# Patient Record
Sex: Male | Born: 1955 | Race: Black or African American | Hispanic: No | Smoking: Current every day smoker
Health system: Southern US, Community
[De-identification: ages and names within clinical notes are randomized; demographics above are authoritative.]

## PROBLEM LIST (undated history)

## (undated) DIAGNOSIS — F32A Depression, unspecified: Secondary | ICD-10-CM

## (undated) DIAGNOSIS — R112 Nausea with vomiting, unspecified: Secondary | ICD-10-CM

## (undated) DIAGNOSIS — J189 Pneumonia, unspecified organism: Secondary | ICD-10-CM

## (undated) DIAGNOSIS — F329 Major depressive disorder, single episode, unspecified: Secondary | ICD-10-CM

## (undated) DIAGNOSIS — Z9889 Other specified postprocedural states: Secondary | ICD-10-CM

## (undated) DIAGNOSIS — E119 Type 2 diabetes mellitus without complications: Secondary | ICD-10-CM

## (undated) DIAGNOSIS — K5792 Diverticulitis of intestine, part unspecified, without perforation or abscess without bleeding: Secondary | ICD-10-CM

## (undated) DIAGNOSIS — H539 Unspecified visual disturbance: Secondary | ICD-10-CM

## (undated) DIAGNOSIS — M719 Bursopathy, unspecified: Secondary | ICD-10-CM

## (undated) DIAGNOSIS — F4310 Post-traumatic stress disorder, unspecified: Secondary | ICD-10-CM

## (undated) DIAGNOSIS — R972 Elevated prostate specific antigen [PSA]: Secondary | ICD-10-CM

## (undated) DIAGNOSIS — J449 Chronic obstructive pulmonary disease, unspecified: Secondary | ICD-10-CM

## (undated) DIAGNOSIS — E785 Hyperlipidemia, unspecified: Secondary | ICD-10-CM

## (undated) DIAGNOSIS — I10 Essential (primary) hypertension: Secondary | ICD-10-CM

## (undated) DIAGNOSIS — G4730 Sleep apnea, unspecified: Secondary | ICD-10-CM

## (undated) HISTORY — PX: COLONOSCOPY: SHX174

## (undated) HISTORY — DX: Post-traumatic stress disorder, unspecified: F43.10

## (undated) HISTORY — DX: Hyperlipidemia, unspecified: E78.5

## (undated) HISTORY — DX: Major depressive disorder, single episode, unspecified: F32.9

## (undated) HISTORY — PX: POLYPECTOMY: SHX149

## (undated) HISTORY — DX: Depression, unspecified: F32.A

## (undated) HISTORY — DX: Sleep apnea, unspecified: G47.30

## (undated) HISTORY — DX: Chronic obstructive pulmonary disease, unspecified: J44.9

## (undated) HISTORY — PX: PROSTATE BIOPSY: SHX241

## (undated) HISTORY — DX: Type 2 diabetes mellitus without complications: E11.9

## (undated) HISTORY — DX: Unspecified visual disturbance: H53.9

---

## 2015-03-17 ENCOUNTER — Encounter (HOSPITAL_COMMUNITY): Payer: Self-pay | Admitting: Emergency Medicine

## 2015-03-17 ENCOUNTER — Emergency Department (HOSPITAL_COMMUNITY)
Admission: EM | Admit: 2015-03-17 | Discharge: 2015-03-17 | Disposition: A | Discharge status: Home / Self Care | Payer: Self-pay | Attending: Emergency Medicine | Admitting: Emergency Medicine

## 2015-03-17 ENCOUNTER — Emergency Department (HOSPITAL_COMMUNITY): Payer: Self-pay

## 2015-03-17 DIAGNOSIS — Y9389 Activity, other specified: Secondary | ICD-10-CM | POA: Insufficient documentation

## 2015-03-17 DIAGNOSIS — Z720 Tobacco use: Secondary | ICD-10-CM | POA: Insufficient documentation

## 2015-03-17 DIAGNOSIS — W010XXA Fall on same level from slipping, tripping and stumbling without subsequent striking against object, initial encounter: Secondary | ICD-10-CM | POA: Insufficient documentation

## 2015-03-17 DIAGNOSIS — Y9289 Other specified places as the place of occurrence of the external cause: Secondary | ICD-10-CM | POA: Insufficient documentation

## 2015-03-17 DIAGNOSIS — S20212A Contusion of left front wall of thorax, initial encounter: Secondary | ICD-10-CM | POA: Insufficient documentation

## 2015-03-17 DIAGNOSIS — Y998 Other external cause status: Secondary | ICD-10-CM | POA: Insufficient documentation

## 2015-03-17 DIAGNOSIS — I159 Secondary hypertension, unspecified: Secondary | ICD-10-CM | POA: Insufficient documentation

## 2015-03-17 HISTORY — DX: Essential (primary) hypertension: I10

## 2015-03-17 MED ORDER — TRAMADOL HCL 50 MG PO TABS: 50.0000 mg | ORAL_TABLET | Freq: Four times a day (QID) | ORAL | Status: DC | PRN

## 2015-03-17 MED ORDER — CYCLOBENZAPRINE HCL 10 MG PO TABS: 10.0000 mg | ORAL_TABLET | Freq: Two times a day (BID) | ORAL | Status: DC | PRN

## 2015-03-17 NOTE — ED Provider Notes (Signed)
CSN: [id number]     Arrival date & time 03/17/15  1611 History  This chart was scribed for non-physician provider Marlon Peliffany Kestrel Mis, PA-C, working with Mancel BaleElliott Wentz, MD by Phillis HaggisGabriella Gaje, ED Scribe. This patient was seen in room TR03C/TR03C and patient care was started at 4:56 PM.    Chief Complaint  Patient presents with  . Rib Injury   The history is provided by the patient. No language interpreter was used.  HPI Comments: Thomas Medina is a 59 y.o. Male with HTN who presents to the Emergency Department complaining of sharp left rib pain onset 3 days ago. He states that he was playing with his grandchildren when he tripped and fell onto his left flank onto cement. Reports that the pain is 5/10, worsens with breathing, coughing or certain movements. He reports taking Aleve to no relief; reports that he has trouble sleeping due to pain. He denies SOB, hematuria, dysuria, chest pain, abdominal pain, leg swelling, or LOC. Pt reports that he has an appointment with his PCP for his HTN later this month, they noted HBP at his last visit and are going to recheck it then will most likely be starting him on medications. Denies chest pains, DOE, angina, weakness, fatigue. Pt is not currently taking any medications, He denies any known allergies.   Past Medical History  Diagnosis Date  . Hypertension    History reviewed. No pertinent past surgical history. No family history on file. History  Substance Use Topics  . Smoking status: Current Every Day Smoker -- 0.50 packs/day    Types: Cigarettes  . Smokeless tobacco: Not on file  . Alcohol Use: Yes    Review of Systems A complete 10 system review of systems was obtained and all systems are negative except as noted in the HPI and PMH.   Allergies  Review of patient's allergies indicates no known allergies.  Home Medications   Prior to Admission medications   Medication Sig Start Date End Date Taking? Authorizing Provider  cyclobenzaprine  (FLEXERIL) 10 MG tablet Take 1 tablet (10 mg total) by mouth 2 (two) times daily as needed for muscle spasms. 03/17/15   Marlon Peliffany Mirtie Bastyr, PA-C  traMADol (ULTRAM) 50 MG tablet Take 1 tablet (50 mg total) by mouth every 6 (six) hours as needed. 03/17/15   Prestin Munch Neva SeatGreene, PA-C   BP 186/86 mmHg  Pulse 100  Temp(Src) 98.5 F (36.9 C) (Oral)  Resp 18  Ht 5\' 9"  (1.753 m)  Wt 220 lb 4.8 oz (99.927 kg)  BMI 32.52 kg/m2  SpO2 96%  Physical Exam  Constitutional: He is oriented to person, place, and time. He appears well-developed and well-nourished.  HENT:  Head: Normocephalic and atraumatic.  Eyes: EOM are normal.  Neck: Normal range of motion. Neck supple.  Cardiovascular: Normal rate and regular rhythm.  Exam reveals no gallop and no friction rub.   No murmur heard. Pulmonary/Chest: Effort normal and breath sounds normal. He has no decreased breath sounds. He has no rales. He exhibits no tenderness, no crepitus and no deformity.  Abdominal: Soft. Bowel sounds are normal. There is no tenderness.  Musculoskeletal: Normal range of motion.  Tenderness over ribs 7-8 to the left lateral ribs. No sternal chest pains or midline thoracic or lumbar tenderness. No crepitus, flail chest, ecchymosis, hematoma overlying the tender region.  Neurological: He is alert and oriented to person, place, and time.  Skin: Skin is warm and dry.  Psychiatric: He has a normal mood and affect. His behavior  is normal.  Nursing note and vitals reviewed.   ED Course  Procedures (including critical care time) DIAGNOSTIC STUDIES: Oxygen Saturation is 96% on RA, normal by my interpretation.    COORDINATION OF CARE: 5:03 PM-Discussed treatment plan which includes rib w/ chest x-ray with pt at bedside and pt agreed to plan.   5:30 PM- will prescribe Ultram and Flexeril for pain; told pt to do deep breathing  Labs Review Labs Reviewed - No data to display  Imaging Review Dg Ribs Unilateral W/chest Left  03/17/2015    CLINICAL DATA:  Fall onto left chest 4 days ago. Left rib and chest pain. Dyspnea.  EXAM: LEFT RIBS AND CHEST - 3+ VIEW  COMPARISON:  None.  FINDINGS: No fracture or other bone lesions are seen involving the ribs. There is no evidence of pneumothorax or pleural effusion. Both lungs are clear. Heart size and mediastinal contours are within normal limits.  IMPRESSION: Negative.   Electronically Signed   By: Myles Rosenthal M.D.   On: 03/17/2015 17:16     EKG Interpretation None      MDM   Final diagnoses:  Secondary hypertension, unspecified  Rib contusion, left, initial encounter    The patients xrays show no obvious fractures. THe patient has been made aware that small fractures not always show up on xrays and that clinically he may have a tiny fracture. A CT scan would be a more definitive test but would not alter the treatment. He was comfortable with clinically being treated as a fracture. He shows no sternal pain, normal lung sounds, physiologic heart sounds. He has some discomfort with large breaths but has been breathing normally. Discussed how he needs to remember to frequently take large cleansing breaths to decrease risk of pneumonia.  Rx: flexeril and Ultram.  Patient has f/u appointment coming up soon for his BP.  Medications - No data to display  59 y.o.Thomas Medina's evaluation in the Emergency Department is complete. It has been determined that no acute conditions requiring further emergency intervention are present at this time. The patient/guardian have been advised of the diagnosis and plan. We have discussed signs and symptoms that warrant return to the ED, such as changes or worsening in symptoms.  Vital signs are stable at discharge. Filed Vitals:   03/17/15 1737  BP: 173/92  Pulse: 95  Temp: 98.3 F (36.8 C)  Resp: 18    Patient/guardian has voiced understanding and agreed to follow-up with the PCP or specialist.  I personally performed the services described  in this documentation, which was scribed in my presence. The recorded information has been reviewed and is accurate.   Marlon Pel, PA-C 03/17/15 2244  Mancel Bale, MD 03/17/15 [phone]

## 2015-03-17 NOTE — Discharge Instructions (Signed)
Chest Contusion A chest contusion is a deep bruise on your chest area. Contusions are the result of an injury that caused bleeding under the skin. A chest contusion may involve bruising of the skin, muscles, or ribs. The contusion may turn blue, purple, or yellow. Minor injuries will give you a painless contusion, but more severe contusions may stay painful and swollen for a few weeks. CAUSES  A contusion is usually caused by a blow, trauma, or direct force to an area of the body. SYMPTOMS   Swelling and redness of the injured area.  Discoloration of the injured area.  Tenderness and soreness of the injured area.  Pain. DIAGNOSIS  The diagnosis can be made by taking a history and performing a physical exam. An X-ray, CT scan, or MRI may be needed to determine if there were any associated injuries, such as broken bones (fractures) or internal injuries. TREATMENT  Often, the best treatment for a chest contusion is resting, icing, and applying cold compresses to the injured area. Deep breathing exercises may be recommended to reduce the risk of pneumonia. Over-the-counter medicines may also be recommended for pain control. HOME CARE INSTRUCTIONS   Put ice on the injured area.  Put ice in a plastic bag.  Place a towel between your skin and the bag.  Leave the ice on for 15-20 minutes, 03-04 times a day.  Only take over-the-counter or prescription medicines as directed by your caregiver. Your caregiver may recommend avoiding anti-inflammatory medicines (aspirin, ibuprofen, and naproxen) for 48 hours because these medicines may increase bruising.  Rest the injured area.  Perform deep-breathing exercises as directed by your caregiver.  Stop smoking if you smoke.  Do not lift objects over 5 pounds (2.3 kg) for 3 days or longer if recommended by your caregiver. SEEK IMMEDIATE MEDICAL CARE IF:   You have increased bruising or swelling.  You have pain that is getting worse.  You have  difficulty breathing.  You have dizziness, weakness, or fainting.  You have blood in your urine or stool.  You cough up or vomit blood.  Your swelling or pain is not relieved with medicines. MAKE SURE YOU:   Understand these instructions.  Will watch your condition.  Will get help right away if you are not doing well or get worse. Document Released: 05/24/2001 Document Revised: 05/23/2012 Document Reviewed: 02/20/2012 [facility] Patient Information 2015 [place], [place]. This information is not intended to replace advice given to you by your health care provider. Make sure you discuss any questions you have with your health care provider.  Hypertension Hypertension, commonly called high blood pressure, is when the force of blood pumping through your arteries is too strong. Your arteries are the blood vessels that carry blood from your heart throughout your body. A blood pressure reading consists of a higher number over a lower number, such as 110/72. The higher number (systolic) is the pressure inside your arteries when your heart pumps. The lower number (diastolic) is the pressure inside your arteries when your heart relaxes. Ideally you want your blood pressure below 120/80. Hypertension forces your heart to work harder to pump blood. Your arteries may become narrow or stiff. Having hypertension puts you at risk for heart disease, stroke, and other problems.  RISK FACTORS Some risk factors for high blood pressure are controllable. Others are not.  Risk factors you cannot control include:   Race. You may be at higher risk if you are African American.  Age. Risk increases with age.  Gender. Men are at higher risk than women before age 58 years. After age 33, women are at higher risk than men. Risk factors you can control include:  Not getting enough exercise or physical activity.  Being overweight.  Getting too much fat, sugar, calories, or salt in your diet.  Drinking too much  alcohol. SIGNS AND SYMPTOMS Hypertension does not usually cause signs or symptoms. Extremely high blood pressure (hypertensive crisis) may cause headache, anxiety, shortness of breath, and nosebleed. DIAGNOSIS  To check if you have hypertension, your health care provider will measure your blood pressure while you are seated, with your arm held at the level of your heart. It should be measured at least twice using the same arm. Certain conditions can cause a difference in blood pressure between your right and left arms. A blood pressure reading that is higher than normal on one occasion does not mean that you need treatment. If one blood pressure reading is high, ask your health care provider about having it checked again. TREATMENT  Treating high blood pressure includes making lifestyle changes and possibly taking medicine. Living a healthy lifestyle can help lower high blood pressure. You may need to change some of your habits. Lifestyle changes may include:  Following the DASH diet. This diet is high in fruits, vegetables, and whole grains. It is low in salt, red meat, and added sugars.  Getting at least 2 hours of brisk physical activity every week.  Losing weight if necessary.  Not smoking.  Limiting alcoholic beverages.  Learning ways to reduce stress. If lifestyle changes are not enough to get your blood pressure under control, your health care provider may prescribe medicine. You may need to take more than one. Work closely with your health care provider to understand the risks and benefits. HOME CARE INSTRUCTIONS  Have your blood pressure rechecked as directed by your health care provider.   Take medicines only as directed by your health care provider. Follow the directions carefully. Blood pressure medicines must be taken as prescribed. The medicine does not work as well when you skip doses. Skipping doses also puts you at risk for problems.   Do not smoke.   Monitor your  blood pressure at home as directed by your health care provider. SEEK MEDICAL CARE IF:   You think you are having a reaction to medicines taken.  You have recurrent headaches or feel dizzy.  You have swelling in your ankles.  You have trouble with your vision. SEEK IMMEDIATE MEDICAL CARE IF:  You develop a severe headache or confusion.  You have unusual weakness, numbness, or feel faint.  You have severe chest or abdominal pain.  You vomit repeatedly.  You have trouble breathing. MAKE SURE YOU:   Understand these instructions.  Will watch your condition.  Will get help right away if you are not doing well or get worse. Document Released: 08/29/2005 Document Revised: 01/13/2014 Document Reviewed: 06/21/2013 [facility] Patient Information 2015 [place], [place]. This information is not intended to replace advice given to you by your health care provider. Make sure you discuss any questions you have with your health care provider.  Rib Contusion A rib contusion (bruise) can occur by a blow to the chest or by a fall against a hard object. Usually these will be much better in a couple weeks. If X-rays were taken today and there are no broken bones (fractures), the diagnosis of bruising is made. However, broken ribs may not show up for several days,  or may be discovered later on a routine X-ray when signs of healing show up. If this happens to you, it does not mean that something was missed on the X-ray, but simply that it did not show up on the first X-rays. Earlier diagnosis will not usually change the treatment. HOME CARE INSTRUCTIONS   Avoid strenuous activity. Be careful during activities and avoid bumping the injured ribs. Activities that pull on the injured ribs and cause pain should be avoided, if possible.  For the first day or two, an ice pack used every 20 minutes while awake may be helpful. Put ice in a plastic bag and put a towel between the bag and the skin.  Eat a normal,  well-balanced diet. Drink plenty of fluids to avoid constipation.  Take deep breaths several times a day to keep lungs free of infection. Try to cough several times a day. Splint the injured area with a pillow while coughing to ease pain. Coughing can help prevent pneumonia.  Wear a rib belt or binder only if told to do so by your caregiver. If you are wearing a rib belt or binder, you must do the breathing exercises as directed by your caregiver. If not used properly, rib belts or binders restrict breathing which can lead to pneumonia.  Only take over-the-counter or prescription medicines for pain, discomfort, or fever as directed by your caregiver. SEEK MEDICAL CARE IF:   You or your child has an oral temperature above 102 F (38.9 C).  Your baby is older than 3 months with a rectal temperature of 100.5 F (38.1 C) or higher for more than 1 day.  You develop a cough, with thick or bloody sputum. SEEK IMMEDIATE MEDICAL CARE IF:   You have difficulty breathing.  You feel sick to your stomach (nausea), have vomiting or belly (abdominal) pain.  You have worsening pain, not controlled with medications, or there is a change in the location of the pain.  You develop sweating or radiation of the pain into the arms, jaw or shoulders, or become light headed or faint.  You or your child has an oral temperature above 102 F (38.9 C), not controlled by medicine.  Your or your baby is older than 3 months with a rectal temperature of 102 F (38.9 C) or higher.  Your baby is 653 months old or younger with a rectal temperature of 100.4 F (38 C) or higher. MAKE SURE YOU:   Understand these instructions.  Will watch your condition.  Will get help right away if you are not doing well or get worse. Document Released: 05/24/2001 Document Revised: 12/24/2012 Document Reviewed: 04/16/2008 [facility] Patient Information 2015 [place]. This information is not intended to replace advice given to  you by your health care provider. Make sure you discuss any questions you have with your health care provider.

## 2015-03-17 NOTE — ED Notes (Signed)
Pt sts 3 days ago he fell on his left side while playing with kids. C.o pain to L rib cage. Denies pain anywhere else. resp e/u.

## 2016-10-21 ENCOUNTER — Encounter (HOSPITAL_COMMUNITY): Payer: Self-pay | Admitting: *Deleted

## 2016-10-21 ENCOUNTER — Emergency Department (HOSPITAL_COMMUNITY)
Admission: EM | Admit: 2016-10-21 | Discharge: 2016-10-21 | Disposition: A | Discharge status: Home / Self Care | Payer: Self-pay | Attending: Emergency Medicine | Admitting: Emergency Medicine

## 2016-10-21 DIAGNOSIS — H1033 Unspecified acute conjunctivitis, bilateral: Secondary | ICD-10-CM | POA: Insufficient documentation

## 2016-10-21 DIAGNOSIS — I10 Essential (primary) hypertension: Secondary | ICD-10-CM | POA: Insufficient documentation

## 2016-10-21 DIAGNOSIS — F17210 Nicotine dependence, cigarettes, uncomplicated: Secondary | ICD-10-CM | POA: Insufficient documentation

## 2016-10-21 MED ORDER — FLUORESCEIN SODIUM 0.6 MG OP STRP
1.0000 | ORAL_STRIP | Freq: Once | OPHTHALMIC | Status: AC
  Administered 2016-10-21: 1 via OPHTHALMIC
  Filled 2016-10-21: qty 1

## 2016-10-21 MED ORDER — TETRACAINE HCL 0.5 % OP SOLN
1.0000 [drp] | Freq: Once | OPHTHALMIC | Status: AC
  Administered 2016-10-21: 1 [drp] via OPHTHALMIC
  Filled 2016-10-21: qty 2

## 2016-10-21 MED ORDER — ERYTHROMYCIN 5 MG/GM OP OINT
1.0000 "application " | TOPICAL_OINTMENT | Freq: Once | OPHTHALMIC | Status: AC
  Administered 2016-10-21: 1 via OPHTHALMIC
  Filled 2016-10-21: qty 3.5

## 2016-10-21 NOTE — Discharge Instructions (Signed)
Use the eye ointment 3 times a day for the next 7 days. Follow up with the eye doctor if symptoms are not improving over the next few day.  Take Benadryl as needed for the itching.

## 2016-10-21 NOTE — ED Provider Notes (Signed)
[facility]-EMERGENCY DEPT Provider Note   CSN: [id number] Arrival date & time: 10/21/16  1708  By signing my name below, I, Alyssa GroveMartin Green, attest that this documentation has been prepared under the direction and in the presence of Kerrie BuffaloHope Arkel Cartwright, NP. Electronically Signed: Alyssa GroveMartin Green, ED Scribe. 10/21/16. 9:37 PM.  History   Chief Complaint Chief Complaint  Patient presents with  . Conjunctivitis   The history is provided by the patient. No language interpreter was used.   HPI Comments: Thomas Medina is a 61 y.o. male who presents to the Emergency Department complaining of gradual onset, constant, moderate bilateral eye injection for 4 days. Per pt, he has had contact with family members who have had pink eye within the last week. Pt reports associated eye pain and eye watering and itching. He believes it spread from the left eye to the right eye. He denies PMHx of Glaucoma. Pt does not have an eye doctor, but does wear glasses. He denies cough, congestion.   Past Medical History:  Diagnosis Date  . Hypertension     There are no active problems to display for this patient.   History reviewed. No pertinent surgical history.     Home Medications    Prior to Admission medications   Medication Sig Start Date End Date Taking? Authorizing Provider  cyclobenzaprine (FLEXERIL) 10 MG tablet Take 1 tablet (10 mg total) by mouth 2 (two) times daily as needed for muscle spasms. 03/17/15   Marlon Peliffany Greene, PA-C  traMADol (ULTRAM) 50 MG tablet Take 1 tablet (50 mg total) by mouth every 6 (six) hours as needed. 03/17/15   Marlon Peliffany Greene, PA-C    Family History No family history on file.  Social History Social History  Substance Use Topics  . Smoking status: Current Every Day Smoker    Packs/day: 0.50    Types: Cigarettes  . Smokeless tobacco: Never Used  . Alcohol use Yes     Comment: occ     Allergies   Patient has no known allergies.   Review of Systems Review of Systems    Constitutional: Negative for chills and fever.  HENT: Negative for congestion and sore throat.   Eyes: Positive for pain, discharge, redness, itching and visual disturbance (blurry vision).  Respiratory: Negative for cough.   Musculoskeletal: Negative for joint swelling.  Skin: Negative for rash.  Neurological: Negative for headaches.     Physical Exam Updated Vital Signs BP 185/85 (BP Location: Right Arm)   Pulse 82   Temp 98.5 F (36.9 C) (Oral)   Resp 16   Ht 5\' 9"  (1.753 m)   Wt 111.6 kg   SpO2 96%   BMI 36.33 kg/m   Physical Exam  Constitutional: He is oriented to person, place, and time. He appears well-developed and well-nourished. He is active. No distress.  HENT:  Head: Normocephalic and atraumatic.  uvula midline No edema or erythema   Eyes: EOM are normal. Pupils are equal, round, and reactive to light. Lids are everted and swept, no foreign bodies found. Right eye exhibits discharge. No foreign body present in the right eye. Left eye exhibits discharge. No foreign body present in the left eye. Right conjunctiva is injected. Left conjunctiva is injected.  Fundoscopic exam:      The right eye shows exudate.       The left eye shows exudate.  Slit lamp exam:      The right eye shows no corneal abrasion, no corneal ulcer, no foreign body and  no fluorescein uptake.       The left eye shows no corneal abrasion, no corneal ulcer, no foreign body and no fluorescein uptake.  Drainage in both eyes Sclera injected bilaterally  Neck: Neck supple.  No cervical lymphadenopathy  Cardiovascular: Normal rate and regular rhythm.   Pulmonary/Chest: Effort normal. He has no wheezes. He has no rales.  Musculoskeletal: Normal range of motion.  Lymphadenopathy:    He has no cervical adenopathy.  Neurological: He is alert and oriented to person, place, and time.  Skin: Skin is warm and dry.  Psychiatric: He has a normal mood and affect. His behavior is normal.  Nursing note and  vitals reviewed.    ED Treatments / Results  DIAGNOSTIC STUDIES: Oxygen Saturation is 94% on RA, adequate by my interpretation.    COORDINATION OF CARE: 8:49 PM Discussed treatment plan with pt at bedside which includes visual acuity screening and pt agreed to plan Radiology No results found.  Procedures Procedures (including critical care time)  Medications Ordered in ED Medications  tetracaine (PONTOCAINE) 0.5 % ophthalmic solution 1 drop (1 drop Both Eyes Given 10/21/16 2205)  fluorescein ophthalmic strip 1 strip (1 strip Both Eyes Given 10/21/16 2205)  erythromycin ophthalmic ointment 1 application (1 application Both Eyes Given 10/21/16 2205)     Initial Impression / Assessment and Plan / ED Course  I have reviewed the triage vital signs and the nursing notes. I personally performed the services described in this documentation, which was scribed in my presence. The recorded information has been reviewed and is accurate.   Final Clinical Impressions(s) / ED Diagnoses  61 y.o. male with bilateral eye redness, drainage and itching after being exposed to other family members with "pink eye" stable for d/c without visual change, f/b, fever and does not appear toxic. Erythromycin Opth Ointment to both eyes prior to d/c and patient to use three times a day for the next week. If symptoms persist after the next 3 days he will f/u with opthalmology. Referral given. He will return here as needed.  Final diagnoses:  Acute bacterial conjunctivitis of both eyes    New Prescriptions Discharge Medication List as of 10/21/2016 10:10 PM       Janne Napoleon, NP 10/22/16 1452    Maia Plan, MD 10/23/16 1900

## 2016-10-21 NOTE — ED Triage Notes (Signed)
Conjunctiva in both eyes is red and painful.  States family members recently with pink eye.

## 2017-03-05 LAB — GLUCOSE, POCT (MANUAL RESULT ENTRY): POC Glucose: 125 mg/dl — AB (ref 70–99)

## 2017-03-27 ENCOUNTER — Encounter (INDEPENDENT_AMBULATORY_CARE_PROVIDER_SITE_OTHER): Payer: Self-pay | Admitting: Physician Assistant

## 2017-03-27 ENCOUNTER — Ambulatory Visit (INDEPENDENT_AMBULATORY_CARE_PROVIDER_SITE_OTHER): Payer: Self-pay | Admitting: Physician Assistant

## 2017-03-27 VITALS — BP 143/74 | HR 83 | Temp 98.0°F | Wt 259.2 lb

## 2017-03-27 DIAGNOSIS — L309 Dermatitis, unspecified: Secondary | ICD-10-CM

## 2017-03-27 DIAGNOSIS — Z91199 Patient's noncompliance with other medical treatment and regimen due to unspecified reason: Secondary | ICD-10-CM

## 2017-03-27 DIAGNOSIS — Z9119 Patient's noncompliance with other medical treatment and regimen: Secondary | ICD-10-CM

## 2017-03-27 DIAGNOSIS — Z760 Encounter for issue of repeat prescription: Secondary | ICD-10-CM

## 2017-03-27 DIAGNOSIS — Z131 Encounter for screening for diabetes mellitus: Secondary | ICD-10-CM

## 2017-03-27 DIAGNOSIS — I10 Essential (primary) hypertension: Secondary | ICD-10-CM

## 2017-03-27 LAB — POCT GLYCOSYLATED HEMOGLOBIN (HGB A1C): Hemoglobin A1C: 6.2

## 2017-03-27 MED ORDER — METOPROLOL SUCCINATE ER 50 MG PO TB24: 50.0000 mg | ORAL_TABLET | Freq: Every day | ORAL | 3 refills | Status: DC

## 2017-03-27 MED ORDER — SILDENAFIL CITRATE 100 MG PO TABS: 100.0000 mg | ORAL_TABLET | Freq: Every day | ORAL | 2 refills | Status: DC | PRN

## 2017-03-27 MED ORDER — MELOXICAM 15 MG PO TABS: 15.0000 mg | ORAL_TABLET | Freq: Every day | ORAL | 0 refills | Status: DC

## 2017-03-27 MED ORDER — HYDROCHLOROTHIAZIDE 25 MG PO TABS: 25.0000 mg | ORAL_TABLET | Freq: Every day | ORAL | 3 refills | Status: DC

## 2017-03-27 MED ORDER — AMLODIPINE BESYLATE 10 MG PO TABS: 10.0000 mg | ORAL_TABLET | Freq: Every day | ORAL | 3 refills | Status: DC

## 2017-03-27 MED ORDER — LOSARTAN POTASSIUM 100 MG PO TABS: 100.0000 mg | ORAL_TABLET | Freq: Every day | ORAL | 3 refills | Status: DC

## 2017-03-27 MED ORDER — TRIAMCINOLONE ACETONIDE 0.5 % EX CREA: 1.0000 "application " | TOPICAL_CREAM | Freq: Two times a day (BID) | CUTANEOUS | 0 refills | Status: AC

## 2017-03-27 MED FILL — !VIAGRA 100 MG TABLET: 100 MG | 30 days supply | Qty: 2 | Fill #0

## 2017-03-27 MED FILL — LOSARTAN POTASSIUM 100 MG T: 100 | 30 days supply | Qty: 30 | Fill #0

## 2017-03-27 MED FILL — TRIAMCINOLONE 0.5% CREAM: 0.5 | 7 days supply | Qty: 30 | Fill #0

## 2017-03-27 MED FILL — ?AMLODIPINE BESYLATE 10 MG: 10 | 30 days supply | Qty: 30 | Fill #0

## 2017-03-27 MED FILL — ?METOPROLOL SUCC ER 50 MG: 50 MG | 30 days supply | Qty: 30 | Fill #0

## 2017-03-27 MED FILL — ?MELOXICAM 15 MG TABLET: 15 MG | 30 days supply | Qty: 30 | Fill #0

## 2017-03-27 MED FILL — HYDROCHLOROTHIAZIDE 25 MG T: 25 | 30 days supply | Qty: 30 | Fill #0

## 2017-03-27 NOTE — Progress Notes (Signed)
Subjective:  Patient ID: Thomas Medina, male    DOB: [birth year]  Age: 61 y.o. MRN: [id number]  CC: HTN  HPI Ernst Cumpston is a 61 y.o. male with a PMH of HTN presents as a new pt to address HTN and blood sugar. Pt went to a health fair recently and was found to have elevated BP and a fasting blood sugar of 125. Has not taken BP medications in 3-4 days. Does not know what anti-hypertensive medications he takes. Used to take medications only when he felt dizzy but feels he has been better about taking his antihypertensive recently.  Does not endorse CP, palpitations, SOB, HA, abdominal pain, tingling, numbness, orthopnea, PND, claudication, presyncope, syncope, or GI/GU symptoms.    In regards to fasting blood sugar, patient does not endorse hx of diabetes. Has never had to take anti-glycemics. Does not endorse polydipsia, polyuria, polyphagia, visual blurring, or fatigue.    Has a pruritic patch on the dorsal aspect of his right wrist. Present for approximately four weeks. Applied Preparation H on rash with relief of symptoms.          ROS Review of Systems  Constitutional: Negative for chills, fever and malaise/fatigue.  Eyes: Negative for blurred vision.  Respiratory: Negative for shortness of breath.   Cardiovascular: Negative for chest pain and palpitations.  Gastrointestinal: Negative for abdominal pain and nausea.  Genitourinary: Negative for dysuria and hematuria.  Musculoskeletal: Negative for joint pain and myalgias.  Skin: Positive for rash (right wrist).  Neurological: Negative for tingling and headaches.  Psychiatric/Behavioral: Negative for depression. The patient is not nervous/anxious.     Objective:  BP (!) 143/74 (BP Location: Left Arm, Patient Position: Sitting, Cuff Size: Large)   Pulse 83   Temp 98 F (36.7 C) (Oral)   Wt 259 lb 3.2 oz (117.6 kg)   SpO2 94%   BMI 38.28 kg/m   BP/Weight 03/27/2017 10/21/2016 03/17/2015  Systolic BP 143 185 173  Diastolic BP 74  85 92  Wt. (Lbs) 259.2 246 220.3  BMI 38.28 36.33 32.52      Physical Exam  Constitutional: He is oriented to person, place, and time.  Well developed, obese, NAD, polite  HENT:  Head: Normocephalic and atraumatic.  Eyes: No scleral icterus.  Neck: Normal range of motion. Neck supple. No thyromegaly present.  Cardiovascular: Normal rate, regular rhythm and normal heart sounds.   No carotid bruit bilaterally  Pulmonary/Chest: Effort normal and breath sounds normal.  Abdominal: Soft. Bowel sounds are normal. There is no tenderness.  Large ventral hernia along region of linea alba  Musculoskeletal: He exhibits no edema.  Neurological: He is alert and oriented to person, place, and time. No cranial nerve deficit. Coordination normal.  Skin: Skin is warm and dry. No rash noted. No erythema. No pallor.  Partially crusted patch on dorsal aspect of right wrist with multiple small papules and mild hyperpigmentation. No suppuration, cellulitis, induration, tenderness, ecchymosis, or edema.  Psychiatric: He has a normal mood and affect. His behavior is normal. Thought content normal.  Vitals reviewed.    Assessment & Plan:   1. Screening for diabetes mellitus - HgB A1c 6.2% in clinic today - Pt counseled on benefits of diet and exercise. He was instructed to make and attain easy and short term goals in order to achieve his long term goals. Pt assured me he will go back to the gym and eat healthier to lose weight.  2. Hypertension, unspecified type - Anti-hypertensive list attained during  visit when pt called home and had family member read off the meds he was prescribed.  - Comprehensive metabolic panel; Future - CBC with Differential; Future - TSH; Future - Lipid Panel; Future - metoprolol succinate (TOPROL-XL) 50 MG 24 hr tablet; Take 1 tablet (50 mg total) by mouth daily. Take with or immediately following a meal.  Dispense: 90 tablet; Refill: 3 - hydrochlorothiazide (HYDRODIURIL) 25  MG tablet; Take 1 tablet (25 mg total) by mouth daily. Take on tablet in the morning.  Dispense: 90 tablet; Refill: 3 - amLODipine (NORVASC) 10 MG tablet; Take 1 tablet (10 mg total) by mouth daily.  Dispense: 90 tablet; Refill: 3 - losartan (COZAAR) 100 MG tablet; Take 1 tablet (100 mg total) by mouth daily.  Dispense: 90 tablet; Refill: 3   3. Noncompliance - I have strongly advised that patient take his anti-hypertensives daily as directed. Warned patient about risks and complications on noncompliance in the setting of hypertension.  4. Medication refill - sildenafil (VIAGRA) 100 MG tablet; Take 1 tablet (100 mg total) by mouth daily as needed for erectile dysfunction.  Dispense: 10 tablet; Refill: 2 - meloxicam (MOBIC) 15 MG tablet; Take 1 tablet (15 mg total) by mouth daily.  Dispense: 30 tablet; Refill: 0  5. Eczema, unspecified type - Begin triamcinolone cream (KENALOG) 0.5 %; Apply 1 application topically 2 (two) times daily. Do not use for more than 7 consecutive days without medical approval.  Dispense: 30 g; Refill: 0   Meds ordered this encounter  Medications  . metoprolol succinate (TOPROL-XL) 50 MG 24 hr tablet    Sig: Take 1 tablet (50 mg total) by mouth daily. Take with or immediately following a meal.    Dispense:  90 tablet    Refill:  3    Order Specific Question:   Supervising Provider    Answer:   Quentin AngstJEGEDE, OLUGBEMIGA E [id number]]  . hydrochlorothiazide (HYDRODIURIL) 25 MG tablet    Sig: Take 1 tablet (25 mg total) by mouth daily. Take on tablet in the morning.    Dispense:  90 tablet    Refill:  3    Order Specific Question:   Supervising Provider    Answer:   Quentin AngstJEGEDE, OLUGBEMIGA E [id number]]  . amLODipine (NORVASC) 10 MG tablet    Sig: Take 1 tablet (10 mg total) by mouth daily.    Dispense:  90 tablet    Refill:  3    Order Specific Question:   Supervising Provider    Answer:   Quentin AngstJEGEDE, OLUGBEMIGA E [id number]]  . losartan (COZAAR) 100 MG tablet    Sig: Take 1  tablet (100 mg total) by mouth daily.    Dispense:  90 tablet    Refill:  3    Order Specific Question:   Supervising Provider    Answer:   Quentin AngstJEGEDE, OLUGBEMIGA E [id number]]  . meloxicam (MOBIC) 15 MG tablet    Sig: Take 1 tablet (15 mg total) by mouth daily.    Dispense:  30 tablet    Refill:  0    Order Specific Question:   Supervising Provider    Answer:   Quentin AngstJEGEDE, OLUGBEMIGA E [id number]]  . sildenafil (VIAGRA) 100 MG tablet    Sig: Take 1 tablet (100 mg total) by mouth daily as needed for erectile dysfunction.    Dispense:  10 tablet    Refill:  2    Order Specific Question:   Supervising Provider    Answer:   Jeanann LewandowskyJEGEDE, OLUGBEMIGA  E [[id number]]  . triamcinolone cream (KENALOG) 0.5 %    Sig: Apply 1 application topically 2 (two) times daily. Do not use for more than 7 consecutive days without medical approval.    Dispense:  30 g    Refill:  0    Order Specific Question:   Supervising Provider    Answer:   Quentin Angst [[id number]]    Follow-up: Return in about 4 weeks (around 04/24/2017) for Hypertension.   Loletta Specter PA

## 2017-03-27 NOTE — Patient Instructions (Signed)

## 2017-03-28 LAB — COMPREHENSIVE METABOLIC PANEL
ALBUMIN: 4.3 g/dL (ref 3.5–5.5)
ALK PHOS: 81 IU/L (ref 39–117)
ALT: 24 IU/L (ref 0–44)
AST: 21 IU/L (ref 0–40)
Albumin/Globulin Ratio: 1.8 (ref 1.2–2.2)
BILIRUBIN TOTAL: 0.3 mg/dL (ref 0.0–1.2)
BUN / CREAT RATIO: 18 (ref 9–20)
BUN: 14 mg/dL (ref 6–24)
CHLORIDE: 104 mmol/L (ref 96–106)
CO2: 23 mmol/L (ref 20–29)
CREATININE: 0.78 mg/dL (ref 0.76–1.27)
Calcium: 8.8 mg/dL (ref 8.7–10.2)
GFR calc Af Amer: 114 mL/min/{1.73_m2} (ref >59)
GFR calc non Af Amer: 99 mL/min/{1.73_m2} (ref >59)
GLUCOSE: 126 mg/dL — AB (ref 65–99)
Globulin, Total: 2.4 g/dL (ref 1.5–4.5)
Potassium: 4.4 mmol/L (ref 3.5–5.2)
Sodium: 141 mmol/L (ref 134–144)
Total Protein: 6.7 g/dL (ref 6.0–8.5)

## 2017-03-28 LAB — CBC WITH DIFFERENTIAL/PLATELET
BASOS ABS: 0 10*3/uL (ref 0.0–0.2)
Basos: 0 %
EOS (ABSOLUTE): 0.2 10*3/uL (ref 0.0–0.4)
Eos: 3 %
Hematocrit: 43.4 % (ref 37.5–51.0)
Hemoglobin: 13.8 g/dL (ref 13.0–17.7)
IMMATURE GRANULOCYTES: 0 %
Immature Grans (Abs): 0 10*3/uL (ref 0.0–0.1)
LYMPHS ABS: 2.4 10*3/uL (ref 0.7–3.1)
Lymphs: 32 %
MCH: 29.5 pg (ref 26.6–33.0)
MCHC: 31.8 g/dL (ref 31.5–35.7)
MCV: 93 fL (ref 79–97)
MONOCYTES: 8 %
Monocytes Absolute: 0.6 10*3/uL (ref 0.1–0.9)
NEUTROS PCT: 57 %
Neutrophils Absolute: 4.2 10*3/uL (ref 1.4–7.0)
Platelets: 269 10*3/uL (ref 150–379)
RBC: 4.68 x10E6/uL (ref 4.14–5.80)
RDW: 15.4 % (ref 12.3–15.4)
WBC: 7.5 10*3/uL (ref 3.4–10.8)

## 2017-03-28 LAB — LIPID PANEL
Chol/HDL Ratio: 5.9 ratio — ABNORMAL HIGH (ref 0.0–5.0)
Cholesterol, Total: 176 mg/dL (ref 100–199)
HDL: 30 mg/dL — ABNORMAL LOW (ref >39)
LDL Calculated: 108 mg/dL — ABNORMAL HIGH (ref 0–99)
TRIGLYCERIDES: 189 mg/dL — AB (ref 0–149)
VLDL Cholesterol Cal: 38 mg/dL (ref 5–40)

## 2017-03-28 LAB — TSH: TSH: 1.38 u[IU]/mL (ref 0.450–4.500)

## 2017-03-29 ENCOUNTER — Other Ambulatory Visit (INDEPENDENT_AMBULATORY_CARE_PROVIDER_SITE_OTHER): Payer: Self-pay | Admitting: Physician Assistant

## 2017-03-29 ENCOUNTER — Telehealth (INDEPENDENT_AMBULATORY_CARE_PROVIDER_SITE_OTHER): Payer: Self-pay

## 2017-03-29 DIAGNOSIS — E785 Hyperlipidemia, unspecified: Secondary | ICD-10-CM

## 2017-03-29 DIAGNOSIS — E782 Mixed hyperlipidemia: Secondary | ICD-10-CM | POA: Insufficient documentation

## 2017-03-29 MED ORDER — LOVASTATIN 20 MG PO TABS: 20.0000 mg | ORAL_TABLET | Freq: Every day | ORAL | 3 refills | Status: DC

## 2017-03-29 MED FILL — LOVASTATIN 20 MG TABLET: 20 | 30 days supply | Qty: 30 | Fill #0

## 2017-03-29 NOTE — Telephone Encounter (Signed)
-----   Message from Loletta Specteroger David Gomez, PA-C sent at 03/29/2017  8:45 AM EDT ----- Labs are normal/unremarkable except for a mild elevation of bad cholesterol. I will send out low dose statin. Adjust diet to eat less fatty/fried foods and sweets.

## 2017-03-29 NOTE — Telephone Encounter (Signed)
Spoke with patient and discussed his normal results as well as his results of elevated cholesterol , informed patient if changing diet to not include fried fatty foods and sweets. Patient expressed understanding. Maryjean Mornempestt S Quintin Hjort, CMA

## 2017-03-31 ENCOUNTER — Ambulatory Visit (INDEPENDENT_AMBULATORY_CARE_PROVIDER_SITE_OTHER): Payer: Self-pay

## 2017-04-13 ENCOUNTER — Ambulatory Visit: Payer: Self-pay | Attending: Internal Medicine

## 2017-04-13 ENCOUNTER — Ambulatory Visit: Payer: Self-pay | Admitting: Licensed Clinical Social Worker

## 2017-04-13 DIAGNOSIS — Z5900 Homelessness unspecified: Secondary | ICD-10-CM

## 2017-04-13 NOTE — BH Specialist Note (Signed)
Integrated Behavioral Health Initial Visit  MRN: [id number] Name: Thomas Medina   Session Start time: 4:00 PM Session End time: 4:30 PM Total time: 30 minutes  Type of Service: Integrated Behavioral Health- Individual/Family Interpretor:No. Interpretor Name and Language: N/A   Warm Hand Off Completed.       SUBJECTIVE: Thomas Medina is a 61 y.o. male accompanied by patient. Patient was referred by Financial Counselor D. Leavy CellaBoyd for [employer]. Patient reports the following symptoms/concerns: homelessness and feelings of sadness Duration of problem: 6 months; Severity of problem: mild  OBJECTIVE: Mood: Pleasant and Affect: Appropriate Risk of harm to self or others: No plan to harm self or others   LIFE CONTEXT: Family and Social: Pt goes back and forth from a friend's house and his adult daughter's residence School/Work: Pt is unemployed. He recently applied for the [employer] and [employer] Self-Care: Pt enjoys fishing and exercising (walking) Life Changes: [place]. He is trying to manage hypertension and blood sugars  GOALS ADDRESSED: Patient will reduce symptoms of: depression and increase knowledge and/or ability of: coping skills and also: Increase adequate support systems for patient/family and Improve medication compliance   INTERVENTIONS: Solution-Focused Strategies, Supportive Counseling, Psychoeducation and/or Health Education and Link to [employer]  Standardized Assessments completed: Patient declined screening  ASSESSMENT: Patient currently experiencing mild symptoms of depression triggered by homelessness and ongoing medical concerns. He receives limited support. Patient may benefit from psychoeducation and psychotherapy. LCSWA educated pt on correlation between physical and mental health. LCSWA discussed benefits of applying healthy coping skills to decrease symptoms and manage  hypertension. Pt was successful in identifying healthy strategies to utilize on a daily basis. LCSWA provided pt with resources to apply for disability, obtain affordable housing, and increase social activity.  PLAN: 1. Follow up with behavioral health clinician on : Pt was encouraged to contact LCSWA if symptoms worsen or fail to improve to [facility]. 2. Behavioral recommendations: LCSWA recommends that pt apply healthy coping skills discussed and utilize provided resources. Pt is encouraged to schedule follow up appointment with LCSWA 3. Referral(s): [employer]:  Housing and social services and senior center 4. "From scale of 1-10, how likely are you to follow plan?": 8/10  Bridgett LarssonJasmine D Lamont Glasscock, LCSW 04/17/17 10:58 AM

## 2017-04-18 ENCOUNTER — Telehealth: Payer: Self-pay | Admitting: Physician Assistant

## 2017-04-18 ENCOUNTER — Ambulatory Visit (INDEPENDENT_AMBULATORY_CARE_PROVIDER_SITE_OTHER): Payer: Self-pay | Admitting: Physician Assistant

## 2017-04-18 ENCOUNTER — Encounter (INDEPENDENT_AMBULATORY_CARE_PROVIDER_SITE_OTHER): Payer: Self-pay | Admitting: Physician Assistant

## 2017-04-18 VITALS — BP 147/71 | HR 74 | Temp 98.3°F | Wt 261.2 lb

## 2017-04-18 DIAGNOSIS — R5383 Other fatigue: Secondary | ICD-10-CM

## 2017-04-18 DIAGNOSIS — Z23 Encounter for immunization: Secondary | ICD-10-CM

## 2017-04-18 DIAGNOSIS — G4700 Insomnia, unspecified: Secondary | ICD-10-CM

## 2017-04-18 DIAGNOSIS — I10 Essential (primary) hypertension: Secondary | ICD-10-CM

## 2017-04-18 NOTE — Progress Notes (Signed)
Subjective:  Patient ID: Thomas Medina, male    DOB: [birth year]  Age: 61 y.o. MRN: [id number]  CC: check testosterone and insomnia  HPI Thomas Medina is a 61 y.o. male with a PMH of HTN presents to discuss insomnia and would like a check of testosterone. Patient is feeling fatigued, "not as ambitious", erectile dysfunction, and lower libido. Does still have satisfactory muscular strength and has a.m. erection at times. Facial hair has been sparse all his life.     Patient has trouble initiating and maintaining sleep. Feels the need to have naps during the day. Believes he snores.Has been told by his daughter that he seems to stop breathing during his sleep. Has extremely vivid and realistic dreams.     Outpatient Medications Prior to Visit  Medication Sig Dispense Refill  . amLODipine (NORVASC) 10 MG tablet Take 1 tablet (10 mg total) by mouth daily. 90 tablet 3  . hydrochlorothiazide (HYDRODIURIL) 25 MG tablet Take 1 tablet (25 mg total) by mouth daily. Take on tablet in the morning. 90 tablet 3  . losartan (COZAAR) 100 MG tablet Take 1 tablet (100 mg total) by mouth daily. 90 tablet 3  . lovastatin (MEVACOR) 20 MG tablet Take 1 tablet (20 mg total) by mouth at bedtime. 90 tablet 3  . meloxicam (MOBIC) 15 MG tablet Take 1 tablet (15 mg total) by mouth daily. 30 tablet 0  . metoprolol succinate (TOPROL-XL) 50 MG 24 hr tablet Take 1 tablet (50 mg total) by mouth daily. Take with or immediately following a meal. 90 tablet 3  . sildenafil (VIAGRA) 100 MG tablet Take 1 tablet (100 mg total) by mouth daily as needed for erectile dysfunction. 10 tablet 2   No facility-administered medications prior to visit.      ROS Review of Systems  Constitutional: Positive for malaise/fatigue. Negative for chills and fever.  Eyes: Negative for blurred vision.  Respiratory: Negative for shortness of breath.   Cardiovascular: Negative for chest pain and palpitations.  Gastrointestinal: Negative for  abdominal pain and nausea.  Genitourinary: Negative for dysuria and hematuria.  Musculoskeletal: Negative for joint pain and myalgias.  Skin: Negative for rash.  Neurological: Negative for tingling and headaches.  Psychiatric/Behavioral: Negative for depression. The patient has insomnia. The patient is not nervous/anxious.     Objective:  BP (!) 147/71 (BP Location: Left Arm, Patient Position: Sitting, Cuff Size: Large)   Pulse 74   Temp 98.3 F (36.8 C) (Oral)   Wt 261 lb 3.2 oz (118.5 kg)   SpO2 92%   BMI 38.57 kg/m   BP/Weight 04/18/2017 03/27/2017 10/21/2016  Systolic BP 147 143 185  Diastolic BP 71 74 85  Wt. (Lbs) 261.2 259.2 246  BMI 38.57 38.28 36.33      Physical Exam  Constitutional: He is oriented to person, place, and time.  Well developed, obese, NAD, polite  HENT:  Head: Normocephalic and atraumatic.  Eyes: No scleral icterus.  Neck: Normal range of motion. Neck supple. No thyromegaly present.  Cardiovascular: Normal rate, regular rhythm and normal heart sounds.   Pulmonary/Chest: Effort normal and breath sounds normal.  Musculoskeletal: He exhibits no edema.  Neurological: He is alert and oriented to person, place, and time.  Skin: Skin is warm and dry. No rash noted. No erythema. No pallor.  Right forearm eczema nearly resolved  Psychiatric: He has a normal mood and affect. His behavior is normal. Thought content normal.  Vitals reviewed.    Assessment & Plan:  1. Need for Tdap vaccination - Tdap vaccine greater than or equal to 7yo IM  2. Fatigue, unspecified type - Testosterone,Free and Total - TSH - FSH/LH  3. Insomnia, unspecified type - Nocturnal polysomnography (NPSG); Future  4. Hypertension, unspecified type - Please take your HCTZ as directed. - I have once again explained how to take his anti-hypertensive medications.    Follow-up: Return in about 4 weeks (around 05/16/2017) for htn.   Loletta Specteroger David Aparna Vanderweele PA

## 2017-04-18 NOTE — Patient Instructions (Signed)
Managing Your Hypertension Hypertension is commonly called high blood pressure. This is when the force of your blood pressing against the walls of your arteries is too strong. Arteries are blood vessels that carry blood from your heart throughout your body. Hypertension forces the heart to work harder to pump blood, and may cause the arteries to become narrow or stiff. Having untreated or uncontrolled hypertension can cause heart attack, stroke, kidney disease, and other problems. What are blood pressure readings? A blood pressure reading consists of a higher number over a lower number. Ideally, your blood pressure should be below 120/80. The first ("top") number is called the systolic pressure. It is a measure of the pressure in your arteries as your heart beats. The second ("bottom") number is called the diastolic pressure. It is a measure of the pressure in your arteries as the heart relaxes. What does my blood pressure reading mean? Blood pressure is classified into four stages. Based on your blood pressure reading, your health care provider may use the following stages to determine what type of treatment you need, if any. Systolic pressure and diastolic pressure are measured in a unit called mm Hg. Normal  Systolic pressure: below 120.  Diastolic pressure: below 80. Elevated  Systolic pressure: 120-129.  Diastolic pressure: below 80. Hypertension stage 1  Systolic pressure: 130-139.  Diastolic pressure: 80-89. Hypertension stage 2  Systolic pressure: 140 or above.  Diastolic pressure: 90 or above. What health risks are associated with hypertension? Managing your hypertension is an important responsibility. Uncontrolled hypertension can lead to:  A heart attack.  A stroke.  A weakened blood vessel (aneurysm).  Heart failure.  Kidney damage.  Eye damage.  Metabolic syndrome.  Memory and concentration problems.  What changes can I make to manage my  hypertension? Hypertension can be managed by making lifestyle changes and possibly by taking medicines. Your health care provider will help you make a plan to bring your blood pressure within a normal range. Eating and drinking  Eat a diet that is high in fiber and potassium, and low in salt (sodium), added sugar, and fat. An example eating plan is called the DASH (Dietary Approaches to Stop Hypertension) diet. To eat this way: ? Eat plenty of fresh fruits and vegetables. Try to fill half of your plate at each meal with fruits and vegetables. ? Eat whole grains, such as whole wheat pasta, brown rice, or whole grain bread. Fill about one quarter of your plate with whole grains. ? Eat low-fat diary products. ? Avoid fatty cuts of meat, processed or cured meats, and poultry with skin. Fill about one quarter of your plate with lean proteins such as fish, chicken without skin, beans, eggs, and tofu. ? Avoid premade and processed foods. These tend to be higher in sodium, added sugar, and fat.  Reduce your daily sodium intake. Most people with hypertension should eat less than 1,500 mg of sodium a day.  Limit alcohol intake to no more than 1 drink a day for nonpregnant women and 2 drinks a day for men. One drink equals 12 oz of beer, 5 oz of wine, or 1 oz of hard liquor. Lifestyle  Work with your health care provider to maintain a healthy body weight, or to lose weight. Ask what an ideal weight is for you.  Get at least 30 minutes of exercise that causes your heart to beat faster (aerobic exercise) most days of the week. Activities may include walking, swimming, or biking.  Include exercise   to strengthen your muscles (resistance exercise), such as weight lifting, as part of your weekly exercise routine. Try to do these types of exercises for 30 minutes at least 3 days a week.  Do not use any products that contain nicotine or tobacco, such as cigarettes and e-cigarettes. If you need help quitting, ask  your health care provider.  Control any long-term (chronic) conditions you have, such as high cholesterol or diabetes. Monitoring  Monitor your blood pressure at home as told by your health care provider. Your personal target blood pressure may vary depending on your medical conditions, your age, and other factors.  Have your blood pressure checked regularly, as often as told by your health care provider. Working with your health care provider  Review all the medicines you take with your health care provider because there may be side effects or interactions.  Talk with your health care provider about your diet, exercise habits, and other lifestyle factors that may be contributing to hypertension.  Visit your health care provider regularly. Your health care provider can help you create and adjust your plan for managing hypertension. Will I need medicine to control my blood pressure? Your health care provider may prescribe medicine if lifestyle changes are not enough to get your blood pressure under control, and if:  Your systolic blood pressure is 130 or higher.  Your diastolic blood pressure is 80 or higher.  Take medicines only as told by your health care provider. Follow the directions carefully. Blood pressure medicines must be taken as prescribed. The medicine does not work as well when you skip doses. Skipping doses also puts you at risk for problems. Contact a health care provider if:  You think you are having a reaction to medicines you have taken.  You have repeated (recurrent) headaches.  You feel dizzy.  You have swelling in your ankles.  You have trouble with your vision. Get help right away if:  You develop a severe headache or confusion.  You have unusual weakness or numbness, or you feel faint.  You have severe pain in your chest or abdomen.  You vomit repeatedly.  You have trouble breathing. Summary  Hypertension is when the force of blood pumping through  your arteries is too strong. If this condition is not controlled, it may put you at risk for serious complications.  Your personal target blood pressure may vary depending on your medical conditions, your age, and other factors. For most people, a normal blood pressure is less than 120/80.  Hypertension is managed by lifestyle changes, medicines, or both. Lifestyle changes include weight loss, eating a healthy, low-sodium diet, exercising more, and limiting alcohol. This information is not intended to replace advice given to you by your health care provider. Make sure you discuss any questions you have with your health care provider. Document Released: 05/23/2012 Document Revised: 07/27/2016 Document Reviewed: 07/27/2016 Elsevier Interactive Patient Education  2018 [employer]  

## 2017-04-18 NOTE — Telephone Encounter (Signed)
Pt called to talk about his Financial application, he want you to call him back

## 2017-04-19 LAB — TESTOSTERONE,FREE AND TOTAL
TESTOSTERONE FREE: 6.8 pg/mL — AB (ref 7.2–24.0)
Testosterone: 238 ng/dL — ABNORMAL LOW (ref 264–916)

## 2017-04-19 LAB — FSH/LH
FSH: 4.9 m[IU]/mL (ref 1.5–12.4)
LH: 2.6 m[IU]/mL (ref 1.7–8.6)

## 2017-04-19 LAB — TSH: TSH: 2.54 u[IU]/mL (ref 0.450–4.500)

## 2017-05-16 ENCOUNTER — Encounter (INDEPENDENT_AMBULATORY_CARE_PROVIDER_SITE_OTHER): Payer: Self-pay | Admitting: Physician Assistant

## 2017-05-16 ENCOUNTER — Ambulatory Visit (INDEPENDENT_AMBULATORY_CARE_PROVIDER_SITE_OTHER): Payer: Self-pay | Admitting: Physician Assistant

## 2017-05-16 VITALS — BP 143/75 | HR 77 | Temp 98.3°F | Wt 257.6 lb

## 2017-05-16 DIAGNOSIS — R400 Somnolence: Secondary | ICD-10-CM

## 2017-05-16 DIAGNOSIS — I10 Essential (primary) hypertension: Secondary | ICD-10-CM

## 2017-05-16 MED ORDER — CLONIDINE HCL 0.1 MG PO TABS: 0.1000 mg | ORAL_TABLET | Freq: Two times a day (BID) | ORAL | 5 refills | Status: DC

## 2017-05-16 MED FILL — LOVASTATIN 20 MG TABLET: 20 | 30 days supply | Qty: 30 | Fill #1

## 2017-05-16 MED FILL — cloNIDine HCL 0.1 MG TABS: 0.1 | 30 days supply | Qty: 60 | Fill #0

## 2017-05-16 MED FILL — LOSARTAN POTASSIUM 100 MG T: 100 | 30 days supply | Qty: 30 | Fill #1

## 2017-05-16 MED FILL — HYDROCHLOROTHIAZIDE 25 MG T: 25 | 30 days supply | Qty: 30 | Fill #1

## 2017-05-16 MED FILL — AMLODIPINE BESYLATE 10 MG T: 10 | 30 days supply | Qty: 30 | Fill #1

## 2017-05-16 MED FILL — METOPROLOL SUCC ER 50 MG TA: 50 | 30 days supply | Qty: 30 | Fill #1

## 2017-05-16 NOTE — Patient Instructions (Addendum)
Stop HCTZ as this may be exacerbating possible gout issues.    Sleep Apnea Sleep apnea is a condition in which breathing pauses or becomes shallow during sleep. Episodes of sleep apnea usually last 10 seconds or longer, and they may occur as many as 20 times an hour. Sleep apnea disrupts your sleep and keeps your body from getting the rest that it needs. This condition can increase your risk of certain health problems, including:  Heart attack.  Stroke.  Obesity.  Diabetes.  Heart failure.  Irregular heartbeat.  There are three kinds of sleep apnea:  Obstructive sleep apnea. This kind is caused by a blocked or collapsed airway.  Central sleep apnea. This kind happens when the part of the brain that controls breathing does not send the correct signals to the muscles that control breathing.  Mixed sleep apnea. This is a combination of obstructive and central sleep apnea.  What are the causes? The most common cause of this condition is a collapsed or blocked airway. An airway can collapse or become blocked if:  Your throat muscles are abnormally relaxed.  Your tongue and tonsils are larger than normal.  You are overweight.  Your airway is smaller than normal.  What increases the risk? This condition is more likely to develop in people who:  Are overweight.  Smoke.  Have a smaller than normal airway.  Are elderly.  Are male.  Drink alcohol.  Take sedatives or tranquilizers.  Have a family history of sleep apnea.  What are the signs or symptoms? Symptoms of this condition include:  Trouble staying asleep.  Daytime sleepiness and tiredness.  Irritability.  Loud snoring.  Morning headaches.  Trouble concentrating.  Forgetfulness.  Decreased interest in sex.  Unexplained sleepiness.  Mood swings.  Personality changes.  Feelings of depression.  Waking up often during the night to urinate.  Dry mouth.  Sore throat.  How is this  diagnosed? This condition may be diagnosed with:  A medical history.  A physical exam.  A series of tests that are done while you are sleeping (sleep study). These tests are usually done in a sleep lab, but they may also be done at home.  How is this treated? Treatment for this condition aims to restore normal breathing and to ease symptoms during sleep. It may involve managing health issues that can affect breathing, such as high blood pressure or obesity. Treatment may include:  Sleeping on your side.  Using a decongestant if you have nasal congestion.  Avoiding the use of depressants, including alcohol, sedatives, and narcotics.  Losing weight if you are overweight.  Making changes to your diet.  Quitting smoking.  Using a device to open your airway while you sleep, such as: ? An oral appliance. This is a custom-made mouthpiece that shifts your lower jaw forward. ? A continuous positive airway pressure (CPAP) device. This device delivers oxygen to your airway through a mask. ? A nasal expiratory positive airway pressure (EPAP) device. This device has valves that you put into each nostril. ? A bi-level positive airway pressure (BPAP) device. This device delivers oxygen to your airway through a mask.  Surgery if other treatments do not work. During surgery, excess tissue is removed to create a wider airway.  It is important to get treatment for sleep apnea. Without treatment, this condition can lead to:  High blood pressure.  Coronary artery disease.  (Men) An inability to achieve or maintain an erection (impotence).  Reduced thinking abilities.  Follow these instructions at home:  Make any lifestyle changes that your health care provider recommends.  Eat a healthy, well-balanced diet.  Take over-the-counter and prescription medicines only as told by your health care provider.  Avoid using depressants, including alcohol, sedatives, and narcotics.  Take steps to  lose weight if you are overweight.  If you were given a device to open your airway while you sleep, use it only as told by your health care provider.  Do not use any tobacco products, such as cigarettes, chewing tobacco, and e-cigarettes. If you need help quitting, ask your health care provider.  Keep all follow-up visits as told by your health care provider. This is important. Contact a health care provider if:  The device that you received to open your airway during sleep is uncomfortable or does not seem to be working.  Your symptoms do not improve.  Your symptoms get worse. Get help right away if:  You develop chest pain.  You develop shortness of breath.  You develop discomfort in your back, arms, or stomach.  You have trouble speaking.  You have weakness on one side of your body.  You have drooping in your face. These symptoms may represent a serious problem that is an emergency. Do not wait to see if the symptoms will go away. Get medical help right away. Call your local emergency services (911 in the [place] Do not drive yourself to the hospital. This information is not intended to replace advice given to you by your health care provider. Make sure you discuss any questions you have with your health care provider. Document Released: 08/19/2002 Document Revised: 04/24/2016 Document Reviewed: 06/08/2015 Elsevier Interactive Patient Education  [employer].

## 2017-05-16 NOTE — Progress Notes (Signed)
Subjective:  Patient ID: Thomas Medina, male    DOB: [birth year]  Age: 61 y.o. MRN: [id number]  CC: f/u HTN   HPI Thomas Medina is a 61 y.o. male with a medical history of HTN presents to f/u on HTN. Last BP 147/71 on 04/18/17. Was on Amlodipine, Losartan, Metoprolol, and HCTZ. Was not taking HCTZ at the time.  Advised to take HCTZ as directed. BP 143/75 today. Patient thinks he had gout in the right ankle after taking HCTZ. Had never experienced this type of pain before. Says he has changed his diet to consume less red meat, sea food, and alcohol. He is also walking half mile in the morning and half mile in the evening. Walks in his bedroom shoes, thinks he should wear supportive sneakers.     Patient also endorses sleep apnea. Never had a sleep study done. Feels the need to have naps during the day. Son in law has witnessed pt stop sleeping during the night. Snores loudly sometimes. Has trouble staying asleep. Patient has been gaining weight despite change in diet and increasing exercise. Denies morning headaches, mood swings, or sore throat.      Outpatient Medications Prior to Visit  Medication Sig Dispense Refill  . amLODipine (NORVASC) 10 MG tablet Take 1 tablet (10 mg total) by mouth daily. 90 tablet 3  . hydrochlorothiazide (HYDRODIURIL) 25 MG tablet Take 1 tablet (25 mg total) by mouth daily. Take on tablet in the morning. 90 tablet 3  . losartan (COZAAR) 100 MG tablet Take 1 tablet (100 mg total) by mouth daily. 90 tablet 3  . lovastatin (MEVACOR) 20 MG tablet Take 1 tablet (20 mg total) by mouth at bedtime. 90 tablet 3  . meloxicam (MOBIC) 15 MG tablet Take 1 tablet (15 mg total) by mouth daily. 30 tablet 0  . metoprolol succinate (TOPROL-XL) 50 MG 24 hr tablet Take 1 tablet (50 mg total) by mouth daily. Take with or immediately following a meal. 90 tablet 3  . sildenafil (VIAGRA) 100 MG tablet Take 1 tablet (100 mg total) by mouth daily as needed for erectile dysfunction. 10 tablet  2   No facility-administered medications prior to visit.      ROS Review of Systems  Constitutional: Negative for chills, fever and malaise/fatigue.  Eyes: Negative for blurred vision.  Respiratory: Negative for shortness of breath.   Cardiovascular: Negative for chest pain and palpitations.  Gastrointestinal: Negative for abdominal pain and nausea.  Genitourinary: Negative for dysuria and hematuria.  Musculoskeletal: Negative for joint pain and myalgias.  Skin: Negative for rash.  Neurological: Negative for tingling and headaches.  Psychiatric/Behavioral: Negative for depression. The patient is not nervous/anxious.     Objective:  BP (!) 143/75 (BP Location: Left Arm, Patient Position: Sitting, Cuff Size: Large)   Pulse 77   Temp 98.3 F (36.8 C) (Oral)   Wt 257 lb 9.6 oz (116.8 kg)   SpO2 91%   BMI 38.04 kg/m   BP/Weight 05/16/2017 04/18/2017 03/27/2017  Systolic BP 143 147 143  Diastolic BP 75 71 74  Wt. (Lbs) 257.6 261.2 259.2  BMI 38.04 38.57 38.28      Physical Exam  Constitutional: He is oriented to person, place, and time.  Well developed, obese, NAD, polite  HENT:  Head: Normocephalic and atraumatic.  Eyes: Conjunctivae are normal. No scleral icterus.  Neck: No thyromegaly present.  Cardiovascular: Normal rate, regular rhythm and normal heart sounds.   Pulmonary/Chest: Effort normal and breath sounds normal.  Abdominal: Soft. Bowel sounds are normal. There is no tenderness.  Musculoskeletal: He exhibits no edema.  Right ankle without erythema, edema, or tenderness to palpation  Neurological: He is alert and oriented to person, place, and time. No cranial nerve deficit. Coordination normal.  Skin: Skin is warm and dry. No rash noted. No erythema. No pallor.  Psychiatric: He has a normal mood and affect. His behavior is normal. Thought content normal.  Vitals reviewed.    Assessment & Plan:    1. Hypertension, unspecified type - Begin cloNIDine  (CATAPRES) 0.1 MG tablet; Take 1 tablet (0.1 mg total) by mouth 2 (two) times daily.  Dispense: 60 tablet; Refill: 5 - Continue on Amlodipine, Losartan, and Metoprolol. - Stop HCTZ due to concern for gout.   2. Daytime somnolence - Nocturnal polysomnography (NPSG); Future   Meds ordered this encounter  Medications  . cloNIDine (CATAPRES) 0.1 MG tablet    Sig: Take 1 tablet (0.1 mg total) by mouth 2 (two) times daily.    Dispense:  60 tablet    Refill:  5    Order Specific Question:   Supervising Provider    Answer:   Quentin AngstJEGEDE, OLUGBEMIGA E [id number]    Follow-up: Return in about 2 weeks (around 05/30/2017) for back pain, erectile dysfunction.   Loletta Specteroger David Altan Kraai PA

## 2017-05-23 ENCOUNTER — Ambulatory Visit: Payer: Self-pay | Attending: Physician Assistant

## 2017-05-30 ENCOUNTER — Other Ambulatory Visit (HOSPITAL_BASED_OUTPATIENT_CLINIC_OR_DEPARTMENT_OTHER): Payer: Self-pay

## 2017-05-30 ENCOUNTER — Ambulatory Visit (INDEPENDENT_AMBULATORY_CARE_PROVIDER_SITE_OTHER): Payer: Self-pay | Admitting: Physician Assistant

## 2017-05-30 DIAGNOSIS — R400 Somnolence: Secondary | ICD-10-CM

## 2017-06-01 ENCOUNTER — Ambulatory Visit (INDEPENDENT_AMBULATORY_CARE_PROVIDER_SITE_OTHER): Payer: Self-pay | Admitting: Physician Assistant

## 2017-06-01 ENCOUNTER — Encounter (INDEPENDENT_AMBULATORY_CARE_PROVIDER_SITE_OTHER): Payer: Self-pay | Admitting: Physician Assistant

## 2017-06-01 VITALS — BP 135/74 | HR 77 | Temp 98.4°F | Wt 264.6 lb

## 2017-06-01 DIAGNOSIS — M25511 Pain in right shoulder: Secondary | ICD-10-CM

## 2017-06-01 DIAGNOSIS — G8929 Other chronic pain: Secondary | ICD-10-CM

## 2017-06-01 DIAGNOSIS — M545 Low back pain: Secondary | ICD-10-CM

## 2017-06-01 DIAGNOSIS — M25512 Pain in left shoulder: Secondary | ICD-10-CM

## 2017-06-01 MED ORDER — NAPROXEN 500 MG PO TABS: 500.0000 mg | ORAL_TABLET | Freq: Two times a day (BID) | ORAL | 0 refills | Status: DC

## 2017-06-01 MED ORDER — CYCLOBENZAPRINE HCL 10 MG PO TABS: 10.0000 mg | ORAL_TABLET | Freq: Every day | ORAL | 0 refills | Status: DC

## 2017-06-01 MED ORDER — ACETAMINOPHEN-CODEINE #3 300-30 MG PO TABS: 1.0000 | ORAL_TABLET | ORAL | 0 refills | Status: AC | PRN

## 2017-06-01 MED FILL — ACETAMINOPHEN/COD #3 TABLET: 300-30 | 7 days supply | Qty: 42 | Fill #0

## 2017-06-01 MED FILL — NAPROXEN 500 MG TABLET: 500 | 15 days supply | Qty: 30 | Fill #0

## 2017-06-01 MED FILL — CYCLOBENZAPRINE 10 MG TAB: 10 | 30 days supply | Qty: 30 | Fill #0

## 2017-06-01 NOTE — Progress Notes (Signed)
Subjective:  Patient ID: Thomas Medina, male    DOB: [birth year]  Age: 61 y.o. MRN: [id number]  CC: f/u back   HPI Thomas Medina is a RHD 60 y.o. male with a medical history of HTN present for back pain and erectile dysfunction. Back pain since a few months ago. Attributed to increased exercise. He is walking and performing isometric exercises every day. Back pain is relieved with forward flexion. Feels a "tightness" in the lower back. Denies radiculopathy, paresthesias, LE weakness, paralysis, urinary/GI sxs.     Bilateral shoulder pain onset 2-3 months ago. Hurts when he lays on either side. Unable to throw an object without pain in the shoulder. Right shoulder pain > left shoulder pain.      Outpatient Medications Prior to Visit  Medication Sig Dispense Refill  . amLODipine (NORVASC) 10 MG tablet Take 1 tablet (10 mg total) by mouth daily. 90 tablet 3  . cloNIDine (CATAPRES) 0.1 MG tablet Take 1 tablet (0.1 mg total) by mouth 2 (two) times daily. 60 tablet 5  . losartan (COZAAR) 100 MG tablet Take 1 tablet (100 mg total) by mouth daily. 90 tablet 3  . lovastatin (MEVACOR) 20 MG tablet Take 1 tablet (20 mg total) by mouth at bedtime. 90 tablet 3  . meloxicam (MOBIC) 15 MG tablet Take 1 tablet (15 mg total) by mouth daily. 30 tablet 0  . metoprolol succinate (TOPROL-XL) 50 MG 24 hr tablet Take 1 tablet (50 mg total) by mouth daily. Take with or immediately following a meal. 90 tablet 3  . sildenafil (VIAGRA) 100 MG tablet Take 1 tablet (100 mg total) by mouth daily as needed for erectile dysfunction. 10 tablet 2   No facility-administered medications prior to visit.      ROS Review of Systems  Constitutional: Negative for chills, fever and malaise/fatigue.  Eyes: Negative for blurred vision.  Respiratory: Negative for shortness of breath.   Cardiovascular: Negative for chest pain and palpitations.  Gastrointestinal: Negative for abdominal pain and nausea.  Genitourinary:  Negative for dysuria and hematuria.  Musculoskeletal: Positive for back pain and joint pain. Negative for myalgias.  Skin: Negative for rash.  Neurological: Negative for tingling and headaches.  Psychiatric/Behavioral: Negative for depression. The patient is not nervous/anxious.     Objective:  BP 135/74 (BP Location: Left Arm, Patient Position: Sitting, Cuff Size: Large)   Pulse 77   Temp 98.4 F (36.9 C) (Oral)   Wt 264 lb 9.6 oz (120 kg)   SpO2 93%   BMI 39.07 kg/m   BP/Weight 06/01/2017 05/16/2017 04/18/2017  Systolic BP 135 143 147  Diastolic BP 74 75 71  Wt. (Lbs) 264.6 257.6 261.2  BMI 39.07 38.04 38.57      Physical Exam  Constitutional: He is oriented to person, place, and time.  Well developed, obese, NAD, polite  HENT:  Head: Normocephalic and atraumatic.  Eyes: No scleral icterus.  Pulmonary/Chest: Effort normal.  Musculoskeletal: He exhibits no edema.  Increased muscular tonicity of the paraspinals of the lumbar spine L > R.   Right shoulder and left shoulder with positive cross body adduction test. aROM of right and left shoulder to approximately 100 degrees of flexion. Neer's test negative, Crank test negative, AC shear testing negative.  Neurological: He is alert and oriented to person, place, and time. No cranial nerve deficit. Coordination normal.  Skin: Skin is warm and dry. No rash noted. No erythema. No pallor.  Psychiatric: He has a normal mood and affect.  His behavior is normal. Thought content normal.  Vitals reviewed.    Assessment & Plan:    1. Chronic bilateral low back pain without sciatica - DG Lumbar Spine Complete; Future - Begin naproxen (NAPROSYN) 500 MG tablet; Take 1 tablet (500 mg total) by mouth 2 (two) times daily with a meal.  Dispense: 30 tablet; Refill: 0 - Begin acetaminophen-codeine (TYLENOL #3) 300-30 MG tablet; Take 1 tablet by mouth every 4 (four) hours as needed for moderate pain.  Dispense: 42 tablet; Refill: 0  2. Pain of  both shoulder joints - Begin naproxen (NAPROSYN) 500 MG tablet; Take 1 tablet (500 mg total) by mouth 2 (two) times daily with a meal.  Dispense: 30 tablet; Refill: 0 - Begin acetaminophen-codeine (TYLENOL #3) 300-30 MG tablet; Take 1 tablet by mouth every 4 (four) hours as needed for moderate pain.  Dispense: 42 tablet; Refill: 0 - Ambulatory referral to Physical Therapy   Meds ordered this encounter  Medications  . naproxen (NAPROSYN) 500 MG tablet    Sig: Take 1 tablet (500 mg total) by mouth 2 (two) times daily with a meal.    Dispense:  30 tablet    Refill:  0    Order Specific Question:   Supervising Provider    Answer:   Quentin Angst [id number]  . acetaminophen-codeine (TYLENOL #3) 300-30 MG tablet    Sig: Take 1 tablet by mouth every 4 (four) hours as needed for moderate pain.    Dispense:  42 tablet    Refill:  0    Order Specific Question:   Supervising Provider    Answer:   Quentin Angst [id number]    Follow-up: Return in about 4 weeks (around 06/29/2017) for back pain.   Loletta Specter PA

## 2017-06-01 NOTE — Progress Notes (Signed)
Pt has been trying to walk for exercise but is unable to walk long distances or even stand for long periods Pt complains of bilateral shoulder pain and pain in the left elbow

## 2017-06-01 NOTE — Patient Instructions (Signed)
Shoulder Impingement Syndrome Shoulder impingement syndrome is a condition that causes pain when connective tissues (tendons) surrounding the shoulder joint become pinched. These tendons are part of the group of muscles and tissues that help to stabilize the shoulder (rotator cuff). Beneath the rotator cuff is a fluid-filled sac (bursa) that allows the muscles and tendons to glide smoothly. The bursa may become swollen or irritated (bursitis). Bursitis, swelling in the rotator cuff tendons, or both conditions can decrease how much space is under a bone in the shoulder joint (acromion), resulting in impingement. What are the causes? Shoulder impingement syndrome can be caused by bursitis or swelling of the rotator cuff tendons, which may result from:  Repetitive overhead arm movements.  Falling onto the shoulder.  Weakness in the shoulder muscles.  What increases the risk? You may be more likely to develop this condition if you are an athlete who participates in:  Sports that involve throwing, such as baseball.  Tennis.  Swimming.  Volleyball.  Some people are also more likely to develop impingement syndrome because of the shape of their acromion bone. What are the signs or symptoms? The main symptom of this condition is pain on the front or side of the shoulder. Pain may:  Get worse when lifting or raising the arm.  Get worse at night.  Wake you up from sleeping.  Feel sharp when the shoulder is moved, and then fade to an ache.  Other signs and symptoms may include:  Tenderness.  Stiffness.  Inability to raise the arm above shoulder level or behind the body.  Weakness.  How is this diagnosed? This condition may be diagnosed based on:  Your symptoms.  Your medical history.  A physical exam.  Imaging tests, such as: ? X-rays. ? MRI. ? Ultrasound.  How is this treated? Treatment for this condition may include:  Resting your shoulder and avoiding all  activities that cause pain or put stress on the shoulder.  Icing your shoulder.  NSAIDs to help reduce pain and swelling.  One or more injections of medicines to numb the area and reduce inflammation.  Physical therapy.  Surgery. This may be needed if nonsurgical treatments have not helped. Surgery may involve repairing the rotator cuff, reshaping the acromion, or removing the bursa.  Follow these instructions at home: Managing pain, stiffness, and swelling  If directed, apply ice to the injured area. ? Put ice in a plastic bag. ? Place a towel between your skin and the bag. ? Leave the ice on for 20 minutes, 2-3 times a day. Activity  Rest and return to your normal activities as told by your health care provider. Ask your health care provider what activities are safe for you.  Do exercises as told by your health care provider. General instructions  Do not use any tobacco products, including cigarettes, chewing tobacco, or e-cigarettes. Tobacco can delay healing. If you need help quitting, ask your health care provider.  Ask your health care provider when it is safe for you to drive.  Take over-the-counter and prescription medicines only as told by your health care provider.  Keep all follow-up visits as told by your health care provider. This is important. How is this prevented?  Give your body time to rest between periods of activity.  Be safe and responsible while being active to avoid falls.  Maintain physical fitness, including strength and flexibility. Contact a health care provider if:  Your symptoms have not improved after 1-2 months of treatment and   rest.  You cannot lift your arm away from your body. This information is not intended to replace advice given to you by your health care provider. Make sure you discuss any questions you have with your health care provider. Document Released: 08/29/2005 Document Revised: 05/05/2016 Document Reviewed:  08/01/2015 Elsevier Interactive Patient Education  2018 [employer]  

## 2017-06-08 ENCOUNTER — Ambulatory Visit (HOSPITAL_BASED_OUTPATIENT_CLINIC_OR_DEPARTMENT_OTHER): Payer: Self-pay | Attending: Physician Assistant | Admitting: Internal Medicine

## 2017-06-08 VITALS — Ht 69.0 in | Wt 257.0 lb

## 2017-06-08 DIAGNOSIS — G4733 Obstructive sleep apnea (adult) (pediatric): Secondary | ICD-10-CM

## 2017-06-08 DIAGNOSIS — R400 Somnolence: Secondary | ICD-10-CM

## 2017-06-12 ENCOUNTER — Other Ambulatory Visit (HOSPITAL_BASED_OUTPATIENT_CLINIC_OR_DEPARTMENT_OTHER): Payer: Self-pay

## 2017-06-12 DIAGNOSIS — R400 Somnolence: Secondary | ICD-10-CM

## 2017-06-17 DIAGNOSIS — R400 Somnolence: Secondary | ICD-10-CM

## 2017-06-17 NOTE — Procedures (Signed)
    Patient Name: Thomas Medina, Thomas Medina Date: 06/08/2017 Gender: Male D.O.B: [birth year] Age (years): 60 Referring Provider: Loletta Specter PA Height (inches): 69 Interpreting Physician: Jetty Duhamel MD, ABSM Weight (lbs): 257 RPSGT: Armen Pickup BMI: 38 MRN: [id number] Neck Size: 17.50 CLINICAL INFORMATION Sleep Study Type: NPSG  Indication for sleep study: Daytime Fatigue, Snoring, Witnesses Apnea / Gasping During Sleep  Epworth Sleepiness Score: 18  SLEEP STUDY TECHNIQUE As per the AASM Manual for the Scoring of Sleep and Associated Events v2.3 (April 2016) with a hypopnea requiring 4% desaturations.  The channels recorded and monitored were frontal, central and occipital EEG, electrooculogram (EOG), submentalis EMG (chin), nasal and oral airflow, thoracic and abdominal wall motion, anterior tibialis EMG, snore microphone, electrocardiogram, and pulse oximetry.  MEDICATIONS Medications self-administered by patient taken the night of the study : none reported  SLEEP ARCHITECTURE The study was initiated at 10:34:21 PM and ended at 4:34:51 AM.  Sleep onset time was 31.7 minutes and the sleep efficiency was 62.0%. The total sleep time was 223.5 minutes.  Stage REM latency was 111.5 minutes.  The patient spent 32.44% of the night in stage N1 sleep, 32.21% in stage N2 sleep, 0.00% in stage N3 and 35.35% in REM.  Alpha intrusion was absent.  Supine sleep was 16.33%.  RESPIRATORY PARAMETERS The overall apnea/hypopnea index (AHI) was 104.4 per hour. There were 193 total apneas, including 193 obstructive, 0 central and 0 mixed apneas. There were 196 hypopneas and 5 RERAs.  The AHI during Stage REM sleep was 99.5 per hour.  AHI while supine was 111.8 per hour.  The mean oxygen saturation was 90.94%. The minimum SpO2 during sleep was 81.00%.  soft snoring was noted during this study.  CARDIAC DATA The 2 lead EKG demonstrated sinus rhythm. The mean heart rate was  66.84 beats per minute. Other EKG findings include: PVCs.  LEG MOVEMENT DATA The total PLMS were 0 with a resulting PLMS index of 0.00. Associated arousal with leg movement index was 0.0 .  IMPRESSIONS - Severe obstructive sleep apnea occurred during this study (AHI = 104.4/h). - No significant central sleep apnea occurred during this study (CAI = 0.0/h). - Mild oxygen desaturation was noted during this study (Min O2 = 81.00%). - The patient snored with soft snoring volume. - EKG findings include PVCs. - Clinically significant periodic limb movements did not occur during sleep. No significant associated arousals.  DIAGNOSIS - Obstructive Sleep Apnea (327.23 [G47.33 ICD-10])  RECOMMENDATIONS - Therapeutic CPAP titration to determine optimal pressure required to alleviate sleep disordered breathing. - Be careful with alcohol, sedatives and other CNS depressants that may worsen sleep apnea and disrupt normal sleep architecture. - Sleep hygiene should be reviewed to assess factors that may improve sleep quality. - Weight management and regular exercise should be initiated or continued if appropriate.  [Electronically signed] 06/17/2017 10:37 AM  Jetty Duhamel MD, ABSM Diplomate, American Board of Sleep Medicine   NPI: [id number]  Waymon Budge Diplomate, American Board of Sleep Medicine  ELECTRONICALLY SIGNED ON:  06/17/2017, 10:34 AM [facility] PH: [phone]   FX: [phone] ACCREDITED BY THE AMERICAN ACADEMY OF SLEEP MEDICINE

## 2017-06-21 ENCOUNTER — Encounter: Payer: Self-pay | Admitting: Physical Therapy

## 2017-06-21 ENCOUNTER — Ambulatory Visit: Payer: Self-pay | Attending: Physician Assistant | Admitting: Physical Therapy

## 2017-06-21 ENCOUNTER — Encounter (INDEPENDENT_AMBULATORY_CARE_PROVIDER_SITE_OTHER): Payer: Self-pay | Admitting: Physician Assistant

## 2017-06-21 ENCOUNTER — Ambulatory Visit (INDEPENDENT_AMBULATORY_CARE_PROVIDER_SITE_OTHER): Payer: Self-pay | Admitting: Physician Assistant

## 2017-06-21 VITALS — BP 144/82 | HR 77 | Temp 98.1°F | Wt 266.4 lb

## 2017-06-21 DIAGNOSIS — M25512 Pain in left shoulder: Secondary | ICD-10-CM

## 2017-06-21 DIAGNOSIS — M25511 Pain in right shoulder: Secondary | ICD-10-CM | POA: Insufficient documentation

## 2017-06-21 DIAGNOSIS — M545 Low back pain: Secondary | ICD-10-CM

## 2017-06-21 DIAGNOSIS — G4733 Obstructive sleep apnea (adult) (pediatric): Secondary | ICD-10-CM

## 2017-06-21 DIAGNOSIS — M6281 Muscle weakness (generalized): Secondary | ICD-10-CM | POA: Insufficient documentation

## 2017-06-21 DIAGNOSIS — G8929 Other chronic pain: Secondary | ICD-10-CM | POA: Insufficient documentation

## 2017-06-21 DIAGNOSIS — R29898 Other symptoms and signs involving the musculoskeletal system: Secondary | ICD-10-CM | POA: Insufficient documentation

## 2017-06-21 MED ORDER — HYDROCODONE-ACETAMINOPHEN 5-325 MG PO TABS: 1.0000 | ORAL_TABLET | Freq: Four times a day (QID) | ORAL | 0 refills | Status: DC | PRN

## 2017-06-21 NOTE — Patient Instructions (Signed)

## 2017-06-21 NOTE — Progress Notes (Signed)
Subjective:  Patient ID: Thomas Medina, male    DOB: 1956-08-02  Age: 61 y.o. MRN: [id number]  CC: f/u back pain  HPI Vang Kraeger is a 61 y.o. male with a medical history of HTN presents on f/u of back pain and erectile dysfunction. Seen here approximately 20 days ago and prescribed Naproxen and Tylenol #3. Lumbar XR ordered but patient did not have XR done because he forgot to have it done. Says Tylenol #3 and Naproxen did not help his back pain or shoulder pain. Denies radiculopathy, paresthesias, LE weakness, paralysis, urinary/GI sxs.     Bilateral shoulder pain onset 2-3 months ago. Hurts when he lays on either side. Unable to throw an object or lift objects above shoulder level without pain in the shoulder. Right shoulder pain > left shoulder pain.     Went to [facility] and was found to have an AHI of 104.4. Has not heard back from them. Needs a CPAP titration.     Outpatient Medications Prior to Visit  Medication Sig Dispense Refill  . amLODipine (NORVASC) 10 MG tablet Take 1 tablet (10 mg total) by mouth daily. 90 tablet 3  . cloNIDine (CATAPRES) 0.1 MG tablet Take 1 tablet (0.1 mg total) by mouth 2 (two) times daily. 60 tablet 5  . cyclobenzaprine (FLEXERIL) 10 MG tablet Take 1 tablet (10 mg total) by mouth at bedtime. 30 tablet 0  . losartan (COZAAR) 100 MG tablet Take 1 tablet (100 mg total) by mouth daily. 90 tablet 3  . lovastatin (MEVACOR) 20 MG tablet Take 1 tablet (20 mg total) by mouth at bedtime. 90 tablet 3  . metoprolol succinate (TOPROL-XL) 50 MG 24 hr tablet Take 1 tablet (50 mg total) by mouth daily. Take with or immediately following a meal. 90 tablet 3  . naproxen (NAPROSYN) 500 MG tablet Take 1 tablet (500 mg total) by mouth 2 (two) times daily with a meal. 30 tablet 0  . sildenafil (VIAGRA) 100 MG tablet Take 1 tablet (100 mg total) by mouth daily as needed for erectile dysfunction. 10 tablet 2   No facility-administered medications  prior to visit.      ROS Review of Systems  Constitutional: Positive for malaise/fatigue. Negative for chills and fever.  Eyes: Negative for blurred vision.  Respiratory: Negative for shortness of breath.   Cardiovascular: Negative for chest pain and palpitations.  Gastrointestinal: Negative for abdominal pain and nausea.  Genitourinary: Negative for dysuria and hematuria.  Musculoskeletal: Positive for back pain and joint pain. Negative for myalgias.  Skin: Negative for rash.  Neurological: Negative for tingling and headaches.  Psychiatric/Behavioral: Negative for depression. The patient is not nervous/anxious.     Objective:  BP (!) 144/82 (BP Location: Left Arm, Patient Position: Sitting, Cuff Size: Large)   Pulse 77   Temp 98.1 F (36.7 C) (Oral)   Wt 266 lb 6.4 oz (120.8 kg)   SpO2 95%   BMI 39.34 kg/m   BP/Weight 06/21/2017 06/08/2017 06/01/2017  Systolic BP 144 - 135  Diastolic BP 82 - 74  Wt. (Lbs) 266.4 257 264.6  BMI 39.34 37.95 39.07      Physical Exam  Constitutional: He is oriented to person, place, and time.  Well developed, well nourished, NAD, polite  HENT:  Head: Normocephalic and atraumatic.  Cardiovascular: Normal rate, regular rhythm and normal heart sounds.   Pulmonary/Chest: Effort normal and breath sounds normal.  Musculoskeletal: He exhibits no edema.  Shoulders limited to approximately  90 degrees of flexion and abduction 2/2 pain. Moderately limited aROM of back.   Neurological: He is alert and oriented to person, place, and time.  Skin: Skin is warm and dry. No rash noted. No erythema. No pallor.  Psychiatric: He has a normal mood and affect. His behavior is normal. Thought content normal.  Vitals reviewed.    Assessment & Plan:    1. Chronic bilateral low back pain without sciatica - Begin HYDROcodone-acetaminophen (NORCO) 5-325 MG tablet; Take 1 tablet by mouth every 6 (six) hours as needed for moderate pain.  Dispense: 28 tablet;  Refill: 0  2. Pain of both shoulder joints - Pt will begin physical therapy today - DG Shoulder Left; Future - DG Shoulder Right; Future - Begin HYDROcodone-acetaminophen (NORCO) 5-325 MG tablet; Take 1 tablet by mouth every 6 (six) hours as needed for moderate pain.  Dispense: 28 tablet; Refill: 0  3. Obstructive sleep apnea - Cpap titration; Future - We scheduled appt for patient this Friday at 8:00 pm   Meds ordered this encounter  Medications  . HYDROcodone-acetaminophen (NORCO) 5-325 MG tablet    Sig: Take 1 tablet by mouth every 6 (six) hours as needed for moderate pain.    Dispense:  28 tablet    Refill:  0    Order Specific Question:   Supervising Provider    Answer:   Quentin Angst [[id number]]    Follow-up: 8 weeks  Loletta Specter PA

## 2017-06-21 NOTE — Therapy (Signed)
[facility] [address] [place], [place], [address] Phone: [phone]   Fax:  [phone]  Physical Therapy Evaluation  Patient Details  Name: Otto Felkins MRN: [id number] Date of Birth: 12-06-1955 Referring Provider: Dr. Lily Kocher   Encounter Date: 06/21/2017      PT End of Session - 06/21/17 1419    Visit Number 1   Number of Visits 12   Date for PT Re-Evaluation 08/02/17   PT Start Time 1325   PT Stop Time 1422   PT Time Calculation (min) 57 min   Activity Tolerance Patient tolerated treatment well   Behavior During Therapy [facility] for tasks assessed/performed      Past Medical History:  Diagnosis Date  . Hypertension     History reviewed. No pertinent surgical history.  There were no vitals filed for this visit.       Subjective Assessment - 06/21/17 1331    Subjective Patient has bilateral shoulder pain for the past few months, worsening and more constant.  He reports popping and clicking over the years.  He has just come from the [place].  He has difficulty using his arms and reaching up overhead, especially in the Rt. hand.  He has some pain in L forearm, hypersensitivity in skin but no radicular symptoms.  Denies neck pain.     Pertinent History cervical sprain 20-25 yrs L side ("tear in the muscle"   Limitations Lifting;House hold activities  laying on side   Diagnostic tests None yet.  XR for Lumbar spine.    Patient Stated Goals Patient would like to be able to lift arms up and have less pain.     Currently in Pain? Yes   Pain Score 10-Worst pain ever   Pain Location Shoulder   Pain Orientation Right   Pain Relieving Factors positioning , not much helps   Multiple Pain Sites Yes   Pain Score 8   Pain Location Shoulder   Pain Orientation Left   Pain Descriptors / Indicators Aching   Pain Type Chronic pain   Pain Radiating Towards none    Pain Onset More than a month ago   Pain Frequency Intermittent   Pain Relieving Factors positions, not used ice or heat, meds little help             [facility] PT Assessment - 06/21/17 0001      Assessment   Medical Diagnosis bilateral shoulder pain    Referring Provider Dr. Lily Kocher    Hand Dominance Right   Prior Therapy No      Precautions   Precautions None     Balance Screen   Has the patient fallen in the past 6 months No   Has the patient had a decrease in activity level because of a fear of falling?  No   Is the patient reluctant to leave their home because of a fear of falling?  No     Home Environment   Living Environment Private residence   Living Arrangements Alone   Type of Home Apartment   Home Access Stairs to enter   Entrance Stairs-Number of Steps 20   Entrance Stairs-Rails Right   Home Layout One level     Prior Function   Vocation Part time employment   Vocation Requirements power/pressure washing business    Leisure grandkids, walking, works, Production assistant, radio   Overall Cognitive Status Within Functional Limits for tasks assessed     Observation/Other Assessments  Focus on Therapeutic Outcomes (FOTO)  68%     Sensation   Light Touch Appears Intact   Additional Comments sometimes he gets numbness in his hands depending on how he is sleeping      Posture/Postural Control   Posture/Postural Control Postural limitations   Postural Limitations Rounded Shoulders;Forward head;Increased thoracic kyphosis     AROM   Right Shoulder Flexion 92 Degrees   Right Shoulder ABduction 80 Degrees   Right Shoulder Internal Rotation --  FR to Rt. hip ,pain    Right Shoulder External Rotation --  FR to side head with compensation , pain    Left Shoulder Flexion 90 Degrees   Left Shoulder ABduction 102 Degrees   Left Shoulder Internal Rotation --  FR to low lumbar on L side , pain    Left Shoulder External Rotation --  FR to mid cervicals, pain      PROM   Overall PROM Comments pain all planes: Rt. ER and IR more  limited than Rt.      Strength   Overall Strength Comments Rt. 22, 22, 30 Lt. 31, 34, 34 lb   Right Shoulder Flexion 3/5   Right Shoulder ABduction 3+/5   Right Shoulder Internal Rotation 4+/5   Right Shoulder External Rotation 3+/5   Left Shoulder Flexion 3/5   Left Shoulder ABduction 3+/5   Left Shoulder Internal Rotation 4/5   Left Shoulder External Rotation 4/5            Objective measurements completed on examination: See above findings.          [facility] Adult PT Treatment/Exercise - 06/21/17 0001      Self-Care   Self-Care Lifting;Posture;Heat/Ice Application;Other Self-Care Comments   Heat/Ice Application ice for inflammation   Other Self-Care Comments  POC, HEP, diagnosis of impingment, anatomy      Shoulder Exercises: Supine   Flexion AAROM;Strengthening;Both;10 reps   Shoulder Flexion Weight (lbs) wand, painful      Shoulder Exercises: Seated   Retraction Strengthening;Both   Retraction Weight (lbs) HEP    External Rotation Strengthening;Both;20 reps;Theraband   Theraband Level (Shoulder External Rotation) Level 2 (Red)   Flexion AAROM;Right;10 reps   Abduction AAROM;Right;10 reps     Cryotherapy   Number Minutes Cryotherapy 10 Minutes   Cryotherapy Location Shoulder   Type of Cryotherapy Ice pack                PT Education - 06/21/17 1532    Education provided Yes   Education Details see self care , ed on flowsheet   Person(s) Educated Patient   Methods Explanation;Demonstration;Tactile cues;Verbal cues;Handout   Comprehension Verbalized understanding;Returned demonstration             PT Long Term Goals - 06/21/17 1525      PT LONG TERM GOAL #1   Title Pt will be I with HEP as of last visit   Time 6   Period Weeks   Status New   Target Date 08/02/17     PT LONG TERM GOAL #2   Title Pt will be able to lift each arm to 120 deg for improved ADLs, home tasks, cooking meals with pain <4/10.    Time 6   Period Weeks    Status New   Target Date 08/02/17     PT LONG TERM GOAL #3   Title Pt will demo UE strength bilaterally to 4+/5 throughout to improve abiity to lift and use UEs for work.  Time 6   Period Weeks   Status New   Target Date 08/02/17     PT LONG TERM GOAL #4   Title Pt will improve avg grip strength by 10 lbs each arm    Baseline Rt. avg 25 lbs Lt 32 lbs    Time 6   Period Weeks   Status New   Target Date 08/02/17     PT LONG TERM GOAL #5   Title Pt will be able to reach to lower back with each arm for improved ADLs, self care, pain minimal.    Time 6   Period Weeks   Status New   Target Date 08/02/17                Plan - 06/21/17 1533    Clinical Impression Statement Patient presents with low complexity eval of bilateral shoulder pain which has been an issue for several months.  His pain is at rest, severe, can lift his arms to only shoulder height.  He has near full PROM in supine with minimal pain.  He will benefit from PT to improve functional mobiity and use of arms for work, recreation and comfort at night.  He has some low back pain as well, was asking if we could treat back once XR is complete.  Will need permission to do so.    Clinical Presentation Stable   Clinical Decision Making Low   Rehab Potential Excellent   PT Frequency 2x / week   PT Duration 6 weeks   PT Treatment/Interventions ADLs/Self Care Home Management;Iontophoresis /ml Dexamethasone;Neuromuscular re-education;Dry needling;Manual techniques;Functional mobility training;Moist Heat;Cryotherapy;Electrical Stimulation;Balance training;Passive range of motion;Therapeutic exercise;Ultrasound;Therapeutic activities;Patient/family education;Taping   PT Next Visit Plan check HEP, manual to upper traps, AAROM, try UE ranger , pulley    PT Home Exercise Plan AAROM, scapular retraction, ER with red band    Consulted and Agree with Plan of Care Patient      Patient will benefit from skilled therapeutic  intervention in order to improve the following deficits and impairments:  Decreased activity tolerance, Decreased range of motion, Decreased strength, Impaired UE functional use, Impaired flexibility, Postural dysfunction, Decreased mobility, Increased fascial restricitons, Pain  Visit Diagnosis: Chronic left shoulder pain - Plan: PT plan of care cert/re-cert  Chronic right shoulder pain - Plan: PT plan of care cert/re-cert  Muscle weakness (generalized) - Plan: PT plan of care cert/re-cert  Other symptoms and signs involving the musculoskeletal system - Plan: PT plan of care cert/re-cert     Problem List Patient Active Problem List   Diagnosis Date Noted  . Hyperlipidemia 03/29/2017  . Hypertension 03/27/2017    Astha Probasco 06/21/2017, 3:41 PM  [facility] [address] [place], [place], [address] Phone: [phone]   Fax:  [phone]  Name: Roscoe Witts MRN: [id number] Date of Birth: Apr 29, 1956   Karie Mainland, PT 06/21/17 3:41 PM Phone: [phone] Fax: [phone]

## 2017-06-23 ENCOUNTER — Ambulatory Visit (HOSPITAL_BASED_OUTPATIENT_CLINIC_OR_DEPARTMENT_OTHER): Payer: Self-pay | Attending: Physician Assistant | Admitting: Internal Medicine

## 2017-06-23 VITALS — Ht 69.0 in | Wt 257.0 lb

## 2017-06-23 DIAGNOSIS — G4733 Obstructive sleep apnea (adult) (pediatric): Secondary | ICD-10-CM | POA: Insufficient documentation

## 2017-06-27 ENCOUNTER — Ambulatory Visit: Payer: Self-pay | Admitting: Physical Therapy

## 2017-06-28 ENCOUNTER — Encounter: Payer: Self-pay | Admitting: Physical Therapy

## 2017-06-28 ENCOUNTER — Ambulatory Visit: Payer: Self-pay | Admitting: Physical Therapy

## 2017-06-28 DIAGNOSIS — R29898 Other symptoms and signs involving the musculoskeletal system: Secondary | ICD-10-CM

## 2017-06-28 DIAGNOSIS — M25512 Pain in left shoulder: Principal | ICD-10-CM

## 2017-06-28 DIAGNOSIS — G8929 Other chronic pain: Secondary | ICD-10-CM

## 2017-06-28 DIAGNOSIS — M6281 Muscle weakness (generalized): Secondary | ICD-10-CM

## 2017-06-28 DIAGNOSIS — M25511 Pain in right shoulder: Secondary | ICD-10-CM

## 2017-06-28 NOTE — Therapy (Signed)
[facility] [address] [place], [place], [address] Phone: [phone]   Fax:  [phone]  Physical Therapy Treatment  Patient Details  Name: Thomas Medina MRN: [id number] Date of Birth: August 14, 1956 Referring Provider: Dr. Lily KocherGomez   Encounter Date: 06/28/2017      PT End of Session - 06/28/17 1627    Visit Number 2   Number of Visits 12   Date for PT Re-Evaluation 08/02/17   PT Start Time 1545   PT Stop Time 1640   PT Time Calculation (min) 55 min   Activity Tolerance Patient tolerated treatment well   Behavior During Therapy [facility] for tasks assessed/performed      Past Medical History:  Diagnosis Date  . Hypertension     History reviewed. No pertinent surgical history.  There were no vitals filed for this visit.      Subjective Assessment - 06/28/17 1539    Currently in Pain? Yes   Pain Score 7   today 7/10   Pain Location Shoulder   Pain Orientation Right;Left   Pain Descriptors / Indicators Aching   Pain Frequency Constant   Aggravating Factors  pain at night ,  better with lifting arms up    Pain Relieving Factors positioning,  not much,  meds at least 1 a day   Multiple Pain Sites --  left " nerve " pain with light touch left forearm                         [facility] Adult PT Treatment/Exercise - 06/28/17 0001      Self-Care   Other Self-Care Comments  impingement education,  charts,  models uses,  posture ed     Shoulder Exercises: Supine   Flexion AAROM;Strengthening;Both;10 reps   Flexion Limitations wand cued for serratus with flexion,  ROM improving     Shoulder Exercises: Seated   Retraction Strengthening;Both  cued to keep shouldes away from ears.   External Rotation Limitations cued to avoid pain,  duration,    Flexion AAROM;Right;10 reps   Abduction AAROM;Right;10 reps     Moist Heat Therapy   Number Minutes Moist Heat 10 Minutes   Moist Heat Location Shoulder  both                 PT Education - 06/28/17 1626    Education provided Yes   Education Details HEP cues,  Anatomy impingement   Person(s) Educated Patient   Methods Explanation;Demonstration;Tactile cues;Verbal cues   Comprehension Verbalized understanding;Returned demonstration             PT Long Term Goals - 06/21/17 1525      PT LONG TERM GOAL #1   Title Pt will be I with HEP as of last visit   Time 6   Period Weeks   Status New   Target Date 08/02/17     PT LONG TERM GOAL #2   Title Pt will be able to lift each arm to 120 deg for improved ADLs, home tasks, cooking meals with pain <4/10.    Time 6   Period Weeks   Status New   Target Date 08/02/17     PT LONG TERM GOAL #3   Title Pt will demo UE strength bilaterally to 4+/5 throughout to improve abiity to lift and use UEs for work.   Time 6   Period Weeks   Status New   Target Date 08/02/17  PT LONG TERM GOAL #4   Title Pt will improve avg grip strength by 10 lbs each arm    Baseline Rt. avg 25 lbs Lt 32 lbs    Time 6   Period Weeks   Status New   Target Date 08/02/17     PT LONG TERM GOAL #5   Title Pt will be able to reach to lower back with each arm for improved ADLs, self care, pain minimal.    Time 6   Period Weeks   Status New   Target Date 08/02/17               Plan - 06/28/17 1627    Clinical Impression Statement Moderate cues for HEP.  Patient 8/10 pain at end of session   PT Treatment/Interventions ADLs/Self Care Home Management;Iontophoresis 4mg /ml Dexamethasone;Neuromuscular re-education;Dry needling;Manual techniques;Functional mobility training;Moist Heat;Cryotherapy;Electrical Stimulation;Balance training;Passive range of motion;Therapeutic exercise;Ultrasound;Therapeutic activities;Patient/family education;Taping   PT Next Visit Plan check HEP, manual to upper traps, AAROM, try UE ranger , pulley    PT Home Exercise Plan AAROM, scapular retraction, ER with red band     Consulted and Agree with Plan of Care Patient      Patient will benefit from skilled therapeutic intervention in order to improve the following deficits and impairments:     Visit Diagnosis: Chronic left shoulder pain  Chronic right shoulder pain  Muscle weakness (generalized)  Other symptoms and signs involving the musculoskeletal system     Problem List Patient Active Problem List   Diagnosis Date Noted  . Hyperlipidemia 03/29/2017  . Hypertension 03/27/2017    Kamora Vossler PTA 06/28/2017, 5:19 PM  [facility] [address] [place], [place], [address] Phone: [phone]   Fax:  [phone]  Name: Thomas Medina MRN: [id number] Date of Birth: 06/03/1956

## 2017-06-29 ENCOUNTER — Ambulatory Visit (INDEPENDENT_AMBULATORY_CARE_PROVIDER_SITE_OTHER): Payer: Self-pay | Admitting: Physician Assistant

## 2017-06-29 MED FILL — AMLODIPINE BESYLATE 10 MG T: 10 | 30 days supply | Qty: 30 | Fill #2

## 2017-06-29 MED FILL — ?LOVASTATIN 20MG TABLET: 20 | 30 days supply | Qty: 30 | Fill #2

## 2017-06-29 MED FILL — !VIAGRA 100 MG TABLET: 100 MG | 30 days supply | Qty: 10 | Fill #1

## 2017-06-29 MED FILL — HYDROCHLOROTHIAZIDE 25 MG T: 25 | 30 days supply | Qty: 30 | Fill #2

## 2017-06-29 MED FILL — ?CLONIDINE HCL 0.1 MG TABL: 0.1 | 30 days supply | Qty: 60 | Fill #1

## 2017-06-29 MED FILL — LOSARTAN POTASSIUM 100 MG T: 100 | 30 days supply | Qty: 30 | Fill #2

## 2017-06-29 MED FILL — METOPROLOL SUCC ER 50 MG TA: 50 | 30 days supply | Qty: 30 | Fill #2

## 2017-07-01 DIAGNOSIS — G4733 Obstructive sleep apnea (adult) (pediatric): Secondary | ICD-10-CM

## 2017-07-01 NOTE — Procedures (Signed)
  Patient Name: Thomas Medina, Thomas Medina Study Date: 06/23/2017 Gender: Male D.O.B: [birth year] Age (years): 60 Referring Provider: Loletta Specteroger David Gomez PA Height (inches): 69 Interpreting Physician: Jetty Duhamellinton Rodderick Holtzer MD, ABSM Weight (lbs): 257 RPSGT: Lowry RamMckinney, Takeya BMI: 38 MRN: [id number] Neck Size: 17.50 CLINICAL INFORMATION The patient is referred for a BiPAP titration to treat sleep apnea.  Date of NPSG, Split Night or HST:  06/08/17  AHI 104.4/ hr, desaturation to 81%, body weight 257 lbs  SLEEP STUDY TECHNIQUE As per the AASM Manual for the Scoring of Sleep and Associated Events v2.3 (April 2016) with a hypopnea requiring 4% desaturations.  The channels recorded and monitored were frontal, central and occipital EEG, electrooculogram (EOG), submentalis EMG (chin), nasal and oral airflow, thoracic and abdominal wall motion, anterior tibialis EMG, snore microphone, electrocardiogram, and pulse oximetry. Bilevel positive airway pressure (BPAP) was initiated at the beginning of the study and titrated to treat sleep-disordered breathing.  MEDICATIONS Medications self-administered by patient taken the night of the study : none reported  RESPIRATORY PARAMETERS Optimal IPAP Pressure (cm): 25 AHI at Optimal Pressure (/hr) 2.2 Optimal EPAP Pressure (cm): 21   Overall Minimal O2 (%): 75.00 Minimal O2 at Optimal Pressure (%): 87.0  SLEEP ARCHITECTURE Start Time: 11:10:07 PM Stop Time: 5:51:28 AM Total Time (min): 401.3 Total Sleep Time (min): 353.5 Sleep Latency (min): 3.6 Sleep Efficiency (%): 88.1 REM Latency (min): 69.0 WASO (min): 44.3 Stage N1 (%): 5.52 Stage N2 (%): 74.12 Stage N3 (%): 0.00 Stage R (%): 20.37 Supine (%): 75.47 Arousal Index (/hr): 17.1      CARDIAC DATA The 2 lead EKG demonstrated sinus rhythm. The mean heart rate was 74.81 beats per minute. Other EKG findings include: PVCs.  LEG MOVEMENT DATA The total Periodic Limb Movements of Sleep (PLMS) were 85. The PLMS index was  14.43. A PLMS index of <15 is considered normal in adults.  IMPRESSIONS - CPAP titration did not provide adequate control at tolerated pressure and was changed to BIPAP. - An optimal BiPAP pressure was selected for this patient ( 25 / 21 cm of water) - Central sleep apnea was not noted during this titration (CAI = 0.0/h). - Severe oxygen desaturations were observed during this titration (min O2 = 75.00%, Mean 92% at final pressure). - The patient snored with loud snoring volume. - 2-lead EKG demonstrated: PVCs - Mild periodic limb movements were observed during this study. Arousals associated with PLMs were rare.  DIAGNOSIS - Obstructive Sleep Apnea (327.23 [G47.33 ICD-10])  RECOMMENDATIONS - Trial of BiPAP therapy on 25/21 cm H2O with a Medium size Fisher&Paykel Full Face Mask Simplus mask and heated humidification. - Be careful with alcohol, sedatives and other CNS depressants that may worsen sleep apnea and disrupt normal sleep architecture. - Sleep hygiene should be reviewed to assess factors that may improve sleep quality. - Weight management and regular exercise should be initiated or continued.  [Electronically signed] 07/01/2017 03:58 PM  Jetty Duhamellinton Chalise Pe MD, ABSM Diplomate, American Board of Sleep Medicine   NPI: [id number]  Waymon BudgeYOUNG,Loreto Loescher D Diplomate, American Board of Sleep Medicine  ELECTRONICALLY SIGNED ON:  07/01/2017, 3:53 PM [facility] PH: [phone]   FX: [phone] ACCREDITED BY THE AMERICAN ACADEMY OF SLEEP MEDICINE

## 2017-07-03 ENCOUNTER — Telehealth (INDEPENDENT_AMBULATORY_CARE_PROVIDER_SITE_OTHER): Payer: Self-pay | Admitting: Physician Assistant

## 2017-07-03 NOTE — Telephone Encounter (Signed)
Patient wants to let know PA Lily KocherGomez that he still having a shoulders pain  Please, call him

## 2017-07-03 NOTE — Telephone Encounter (Signed)
Patient has scheduled appointment for 07/13/17. Maryjean Mornempestt S Zori Benbrook, CMA

## 2017-07-04 ENCOUNTER — Other Ambulatory Visit: Payer: Self-pay | Admitting: *Deleted

## 2017-07-04 ENCOUNTER — Encounter: Payer: Self-pay | Admitting: Physical Therapy

## 2017-07-04 ENCOUNTER — Ambulatory Visit: Payer: Self-pay | Admitting: Physical Therapy

## 2017-07-04 DIAGNOSIS — Z760 Encounter for issue of repeat prescription: Secondary | ICD-10-CM

## 2017-07-04 DIAGNOSIS — R29898 Other symptoms and signs involving the musculoskeletal system: Secondary | ICD-10-CM

## 2017-07-04 DIAGNOSIS — M6281 Muscle weakness (generalized): Secondary | ICD-10-CM

## 2017-07-04 DIAGNOSIS — G8929 Other chronic pain: Secondary | ICD-10-CM

## 2017-07-04 DIAGNOSIS — M25512 Pain in left shoulder: Principal | ICD-10-CM

## 2017-07-04 DIAGNOSIS — M25511 Pain in right shoulder: Secondary | ICD-10-CM

## 2017-07-04 MED ORDER — SILDENAFIL CITRATE 100 MG PO TABS: 100.0000 mg | ORAL_TABLET | Freq: Every day | ORAL | 3 refills | Status: DC | PRN

## 2017-07-04 NOTE — Therapy (Signed)
[facility] [address] [place], [place], [address] Phone: [phone]   Fax:  [phone]  Physical Therapy Treatment  Patient Details  Name: Thomas Medina MRN: [id number] Date of Birth: 1956-05-23 Referring Provider: Dr. Lily KocherGomez   Encounter Date: 07/04/2017      PT End of Session - 07/04/17 1351    Visit Number 3   Number of Visits 12   Date for PT Re-Evaluation 08/02/17   PT Start Time 1331   PT Stop Time 1422   PT Time Calculation (min) 51 min   Activity Tolerance Patient tolerated treatment well   Behavior During Therapy [facility] for tasks assessed/performed      Past Medical History:  Diagnosis Date  . Hypertension     History reviewed. No pertinent surgical history.  There were no vitals filed for this visit.      Subjective Assessment - 07/04/17 1334    Subjective Pt had increased soreness/pain after last session.  He has a new pain post scapular area now, like an ache.    Currently in Pain? Yes   Pain Score 9    Pain Location Shoulder   Pain Orientation Left   Pain Descriptors / Indicators Aching   Pain Type Chronic pain   Pain Onset More than a month ago   Pain Frequency Intermittent   Pain Score 8   Pain Location Shoulder   Pain Orientation Left   Pain Type Chronic pain   Pain Onset More than a month ago   Pain Frequency Intermittent            [facility] Adult PT Treatment/Exercise - 07/04/17 0001      Shoulder Exercises: Supine   Other Supine Exercises cane ex: chest press x 10 and overhead with cane x 10      Shoulder Exercises: Seated   External Rotation Strengthening;Both;10 reps   Theraband Level (Shoulder External Rotation) Level 3 (Green)   Flexion AAROM;Both;10 reps   Abduction AAROM;Both;10 reps   Other Seated Exercises UE Ranger: semi circles      Shoulder Exercises: Standing   Extension Strengthening;Both;15 reps   Theraband Level (Shoulder Extension) Level 3 (Green)   Row  Strengthening;Both;15 reps   Theraband Level (Shoulder Row) Level 3 (Green)     Cryotherapy   Number Minutes Cryotherapy 10 Minutes   Cryotherapy Location Shoulder   Type of Cryotherapy Ice pack     Iontophoresis   Type of Iontophoresis Dexamethasone   Location bilateral shoulders anterior   Dose 1 cc patch x 2    Time 6 hr      Manual Therapy   Manual Therapy Passive ROM   Passive ROM all planes bilateral                      PT Long Term Goals - 06/21/17 1525      PT LONG TERM GOAL #1   Title Pt will be I with HEP as of last visit   Time 6   Period Weeks   Status New   Target Date 08/02/17     PT LONG TERM GOAL #2   Title Pt will be able to lift each arm to 120 deg for improved ADLs, home tasks, cooking meals with pain <4/10.    Time 6   Period Weeks   Status New   Target Date 08/02/17     PT LONG TERM GOAL #3   Title Pt will demo UE strength  bilaterally to 4+/5 throughout to improve abiity to lift and use UEs for work.   Time 6   Period Weeks   Status New   Target Date 08/02/17     PT LONG TERM GOAL #4   Title Pt will improve avg grip strength by 10 lbs each arm    Baseline Rt. avg 25 lbs Lt 32 lbs    Time 6   Period Weeks   Status New   Target Date 08/02/17     PT LONG TERM GOAL #5   Title Pt will be able to reach to lower back with each arm for improved ADLs, self care, pain minimal.    Time 6   Period Weeks   Status New   Target Date 08/02/17               Plan - 07/04/17 1419    Clinical Impression Statement Pt is experiencing increased pain since last visit. He was able ot do his exercises today without much difficulty.  He does report trying to pas the football and play a bit with grandkids.  Trial of ionto for inflammation and ice.  Given a less resistance band (green)    PT Next Visit Plan check HEP, manual to upper traps, AAROM, try UE ranger , pulley    PT Home Exercise Plan AAROM, scapular retraction, ER with green     Consulted and Agree with Plan of Care Patient      Patient will benefit from skilled therapeutic intervention in order to improve the following deficits and impairments:  Decreased activity tolerance, Decreased range of motion, Decreased strength, Impaired UE functional use, Impaired flexibility, Postural dysfunction, Decreased mobility, Increased fascial restricitons, Pain  Visit Diagnosis: Chronic left shoulder pain  Chronic right shoulder pain  Muscle weakness (generalized)  Other symptoms and signs involving the musculoskeletal system     Problem List Patient Active Problem List   Diagnosis Date Noted  . Hyperlipidemia 03/29/2017  . Hypertension 03/27/2017    Thomas Medina 07/04/2017, 2:22 PM  [facility] [address] [place], [place], [address] Phone: [phone]   Fax:  [phone]  Name: Thomas Medina MRN: [id number] Date of Birth: 1956-04-18  Thomas Medina, PT 07/04/17 2:22 PM Phone: [phone] Fax: [phone]

## 2017-07-04 NOTE — Patient Instructions (Addendum)
   Low Row: Standing    Face anchor, feet shoulder width apart. Palms up, pull arms back, squeezing shoulder blades together. Repeat _10-20_ times per set. Do _1_ sets per session. Do _5_ sessions per week. Anchor Height: Waist     IONTOPHORESIS PATIENT PRECAUTIONS & CONTRAINDICATIONS:  . Redness under one or both electrodes can occur.  This characterized by a uniform redness that usually disappears within 12 hours of treatment. . Small pinhead size blisters may result in response to the drug.  Contact your physician if the problem persists more than 24 hours. . On rare occasions, iontophoresis therapy can result in temporary skin reactions such as rash, inflammation, irritation or burns.  The skin reactions may be the result of individual sensitivity to the ionic solution used, the condition of the skin at the start of treatment, reaction to the materials in the electrodes, allergies or sensitivity to dexamethasone, or a poor connection between the patch and your skin.  Discontinue using iontophoresis if you have any of these reactions and report to your therapist. . Remove the Patch or electrodes if you have any undue sensation of pain or burning during the treatment and report discomfort to your therapist. . Tell your Therapist if you have had known adverse reactions to the application of electrical current. . If using the Patch, the LED light will turn off when treatment is complete and the patch can be removed.  Approximate treatment time is 1-3 hours.  Remove the patch when light goes off or after 6 hours. . The Patch can be worn during normal activity, however excessive motion where the electrodes have been placed can cause poor contact between the skin and the electrode or uneven electrical current resulting in greater risk of skin irritation. Marland Kitchen. Keep out of the reach of children.   . DO NOT use if you have a cardiac pacemaker or any other electrically sensitive implanted  device. . DO NOT use if you have a known sensitivity to dexamethasone. . DO NOT use during Magnetic Resonance Imaging (MRI). . DO NOT use over broken or compromised skin (e.g. sunburn, cuts, or acne) due to the increased risk of skin reaction. . DO NOT SHAVE over the area to be treated:  To establish good contact between the Patch and the skin, excessive hair may be clipped. . DO NOT place the Patch or electrodes on or over your eyes, directly over your heart, or brain. . DO NOT reuse the Patch or electrodes as this may cause burns to occur.  Copyright  VHI. All rights reserved.

## 2017-07-04 NOTE — Telephone Encounter (Signed)
PRINTED FOR PASS PROGRAM 

## 2017-07-05 ENCOUNTER — Ambulatory Visit (HOSPITAL_COMMUNITY)
Admission: RE | Admit: 2017-07-05 | Discharge: 2017-07-05 | Disposition: A | Discharge status: Home / Self Care | Payer: Self-pay | Source: Ambulatory Visit | Attending: Physician Assistant | Admitting: Physician Assistant

## 2017-07-05 DIAGNOSIS — G8929 Other chronic pain: Secondary | ICD-10-CM | POA: Insufficient documentation

## 2017-07-05 DIAGNOSIS — M47816 Spondylosis without myelopathy or radiculopathy, lumbar region: Secondary | ICD-10-CM | POA: Insufficient documentation

## 2017-07-05 DIAGNOSIS — M25512 Pain in left shoulder: Secondary | ICD-10-CM | POA: Insufficient documentation

## 2017-07-05 DIAGNOSIS — M25511 Pain in right shoulder: Secondary | ICD-10-CM | POA: Insufficient documentation

## 2017-07-05 DIAGNOSIS — M545 Low back pain: Secondary | ICD-10-CM | POA: Insufficient documentation

## 2017-07-06 ENCOUNTER — Telehealth (INDEPENDENT_AMBULATORY_CARE_PROVIDER_SITE_OTHER): Payer: Self-pay

## 2017-07-06 ENCOUNTER — Ambulatory Visit: Payer: Self-pay | Admitting: Physical Therapy

## 2017-07-06 DIAGNOSIS — M25511 Pain in right shoulder: Secondary | ICD-10-CM

## 2017-07-06 DIAGNOSIS — M6281 Muscle weakness (generalized): Secondary | ICD-10-CM

## 2017-07-06 DIAGNOSIS — M25512 Pain in left shoulder: Principal | ICD-10-CM

## 2017-07-06 DIAGNOSIS — R29898 Other symptoms and signs involving the musculoskeletal system: Secondary | ICD-10-CM

## 2017-07-06 DIAGNOSIS — G8929 Other chronic pain: Secondary | ICD-10-CM

## 2017-07-06 NOTE — Telephone Encounter (Signed)
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 Maryjean Mornempestt S Kelbi Renstrom, CMA

## 2017-07-06 NOTE — Telephone Encounter (Signed)
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

## 2017-07-06 NOTE — Therapy (Signed)
[facility] [address] [place], [place], [address] Phone: [phone]   Fax:  [phone]  Physical Therapy Treatment  Patient Details  Name: Thomas Medina MRN: [id number] Date of Birth: Oct 11, 1955 Referring Provider: Dr. Lily KocherGomez   Encounter Date: 07/06/2017      PT End of Session - 07/06/17 1246    Visit Number 4   Number of Visits 12   Date for PT Re-Evaluation 08/02/17   PT Start Time 1243   PT Stop Time 1321   PT Time Calculation (min) 38 min      Past Medical History:  Diagnosis Date  . Hypertension     No past surgical history on file.  There were no vitals filed for this visit.      Subjective Assessment - 07/06/17 1245    Subjective I was in alot of pain yesterday.    Currently in Pain? No/denies                         [facility]/Exercise - 07/06/17 0001      Shoulder Exercises: Supine   Protraction 10 reps  cane   External Rotation 10 reps  cane AA/stretch   Other Supine Exercises bilateral ER green band    Other Supine Exercises cane ex: chest press x 10 and overhead with cane x 10      Shoulder Exercises: Standing   Extension Strengthening;Both;15 reps   Theraband Level (Shoulder Extension) Level 3 (Green)   Row Strengthening;Both;15 reps   Theraband Level (Shoulder Row) Level 3 (Green)   Other Standing Exercises UE ranger flexion, horizontals , IR , from floor     Shoulder Exercises: Pulleys   Flexion 2 minutes     Iontophoresis   Type of Iontophoresis Dexamethasone   Location bilateral shoulders anterior   Dose 1 cc patch x 2    Time 6 hr      Manual Therapy   Passive ROM all planes bilateral , limited Right ER/IR                      PT Long Term Goals - 06/21/17 1525      PT LONG TERM GOAL #1   Title Pt will be I with HEP as of last visit   Time 6   Period Weeks   Status New   Target Date 08/02/17     PT LONG TERM GOAL #2   Title Pt will be able to lift each arm to 120 deg for improved ADLs, home tasks, cooking meals with pain <4/10.    Time 6   Period Weeks   Status New   Target Date 08/02/17     PT LONG TERM GOAL #3   Title Pt will demo UE strength bilaterally to 4+/5 throughout to improve abiity to lift and use UEs for work.   Time 6   Period Weeks   Status New   Target Date 08/02/17     PT LONG TERM GOAL #4   Title Pt will improve avg grip strength by 10 lbs each arm    Baseline Rt. avg 25 lbs Lt 32 lbs    Time 6   Period Weeks   Status New   Target Date 08/02/17     PT LONG TERM GOAL #5   Title Pt will be able to reach to lower back with each arm for improved ADLs, self care, pain  minimal.    Time 6   Period Weeks   Status New   Target Date 08/02/17               Plan - 07/06/17 1319    Clinical Impression Statement Pt reports pain yesterday and no pain today after taking asprin. He was able to complete all AAROM exercises without c/ o pain. He c/o pain with bilateral ER with therabnd in right shoulder. Cues to decrease ROM to painfree.  Pain with PROM for Right IR and bilateral flexion. Repeated ionto as pt reports this was helpful with pain.    PT Next Visit Plan check HEP, manual to upper traps, AAROM, continue  ranger , pulley , try UBE?   PT Home Exercise Plan AAROM, scapular retraction, ER with green       Patient will benefit from skilled therapeutic intervention in order to improve the following deficits and impairments:  Decreased activity tolerance, Decreased range of motion, Decreased strength, Impaired UE functional use, Impaired flexibility, Postural dysfunction, Decreased mobility, Increased fascial restricitons, Pain  Visit Diagnosis: Chronic left shoulder pain  Chronic right shoulder pain  Muscle weakness (generalized)  Other symptoms and signs involving the musculoskeletal system     Problem List Patient Active Problem List   Diagnosis Date Noted  .  Hyperlipidemia 03/29/2017  . Hypertension 03/27/2017    Sherrie Mustache, PTA 07/06/2017, 1:22 PM  [facility] [address] [place], [place], [address] Phone: [phone]   Fax:  [phone]  Name: Thomas Medina MRN: [id number] Date of Birth: 1956/06/19

## 2017-07-10 ENCOUNTER — Ambulatory Visit: Payer: Self-pay | Admitting: Physical Therapy

## 2017-07-10 DIAGNOSIS — M25511 Pain in right shoulder: Secondary | ICD-10-CM

## 2017-07-10 DIAGNOSIS — M25512 Pain in left shoulder: Principal | ICD-10-CM

## 2017-07-10 DIAGNOSIS — M6281 Muscle weakness (generalized): Secondary | ICD-10-CM

## 2017-07-10 DIAGNOSIS — G8929 Other chronic pain: Secondary | ICD-10-CM

## 2017-07-10 DIAGNOSIS — R29898 Other symptoms and signs involving the musculoskeletal system: Secondary | ICD-10-CM

## 2017-07-10 NOTE — Therapy (Signed)
[facility] [address] [place], [place], [address] Phone: [phone]   Fax:  [phone]  Physical Therapy Treatment  Patient Details  Name: Thomas Medina MRN: [id number] Date of Birth: 1955/11/20 Referring Provider: Dr. Lily Kocher   Encounter Date: 07/10/2017      PT End of Session - 07/10/17 1304    Visit Number 5   Number of Visits 12   Date for PT Re-Evaluation 08/02/17   PT Start Time 0103   PT Stop Time 0146   PT Time Calculation (min) 43 min      Past Medical History:  Diagnosis Date  . Hypertension     No past surgical history on file.  There were no vitals filed for this visit.      Subjective Assessment - 07/10/17 1307    Currently in Pain? Yes   Pain Score 8    Pain Location Shoulder   Pain Orientation Right;Left   Pain Type Chronic pain   Aggravating Factors  pain at night   Pain Relieving Factors raising and twisting arm when on my side.             [facility] PT Assessment - 07/10/17 0001      AROM   Right Shoulder Flexion 120 Degrees   Right Shoulder ABduction 115 Degrees   Left Shoulder Flexion 110 Degrees   Left Shoulder ABduction 108 Degrees                     [facility] Adult PT Treatment/Exercise - 07/10/17 0001      Shoulder Exercises: Supine   Protraction 10 reps  cane   Other Supine Exercises cane ex: chest press x 10 and overhead with cane x 10 , horizontal abduction/adduction     Shoulder Exercises: Seated   External Rotation Strengthening;Both;10 reps   Theraband Level (Shoulder External Rotation) Level 2 (Red)   Internal Rotation Strengthening;Both;10 reps   Theraband Level (Shoulder Internal Rotation) Level 2 (Red)     Shoulder Exercises: Standing   Extension Strengthening;Both;15 reps   Theraband Level (Shoulder Extension) Level 3 (Green)   Row Strengthening;Both;15 reps   Theraband Level (Shoulder Row) Level 3 (Green)   Other Standing Exercises wall ladder  x 3 each   pt c/o back pain with standing.      Shoulder Exercises: Pulleys   Flexion 2 minutes     Shoulder Exercises: ROM/Strengthening   UBE (Upper Arm Bike) L1 forward 3 minutes, backward 3 minutes.   cues for posture      Manual Therapy   Passive ROM all planes bilateral , limited Right ER/IR                      PT Long Term Goals - 06/21/17 1525      PT LONG TERM GOAL #1   Title Pt will be I with HEP as of last visit   Time 6   Period Weeks   Status New   Target Date 08/02/17     PT LONG TERM GOAL #2   Title Pt will be able to lift each arm to 120 deg for improved ADLs, home tasks, cooking meals with pain <4/10.    Time 6   Period Weeks   Status New   Target Date 08/02/17     PT LONG TERM GOAL #3   Title Pt will demo UE strength bilaterally to 4+/5 throughout to improve abiity to lift and use  UEs for work.   Time 6   Period Weeks   Status New   Target Date 08/02/17     PT LONG TERM GOAL #4   Title Pt will improve avg grip strength by 10 lbs each arm    Baseline Rt. avg 25 lbs Lt 32 lbs    Time 6   Period Weeks   Status New   Target Date 08/02/17     PT LONG TERM GOAL #5   Title Pt will be able to reach to lower back with each arm for improved ADLs, self care, pain minimal.    Time 6   Period Weeks   Status New   Target Date 08/02/17               Plan - 07/10/17 1315    Clinical Impression Statement Improved PROM/AROM today noted. Continues with high levels of pain however completes all exercises with ease. He has back pain with standing exercises. Plans to ask MD for Lumbar referral on Thursday. He will discuss results of lumbar and bilateral shoulder xrays with MD as well.    PT Next Visit Plan check HEP, manual to upper traps, AAROM, continue  ranger , pulley , UBE, try Supine scap stab?   PT Home Exercise Plan AAROM, scapular retraction, ER with green    Consulted and Agree with Plan of Care Patient      Patient will  benefit from skilled therapeutic intervention in order to improve the following deficits and impairments:  Decreased activity tolerance, Decreased range of motion, Decreased strength, Impaired UE functional use, Impaired flexibility, Postural dysfunction, Decreased mobility, Increased fascial restricitons, Pain  Visit Diagnosis: Chronic left shoulder pain  Chronic right shoulder pain  Muscle weakness (generalized)  Other symptoms and signs involving the musculoskeletal system     Problem List Patient Active Problem List   Diagnosis Date Noted  . Hyperlipidemia 03/29/2017  . Hypertension 03/27/2017    Sherrie Mustacheonoho, Kela Baccari McGee, PTA 07/10/2017, 1:46 PM  [facility] [address] [place], [place], [address] Phone: [phone]   Fax:  [phone]  Name: Thomas Medina MRN: [id number] Date of Birth: 15-Mar-1956

## 2017-07-12 ENCOUNTER — Encounter: Payer: Self-pay | Admitting: Physical Therapy

## 2017-07-12 ENCOUNTER — Ambulatory Visit: Payer: Self-pay | Admitting: Physical Therapy

## 2017-07-12 DIAGNOSIS — M25511 Pain in right shoulder: Secondary | ICD-10-CM

## 2017-07-12 DIAGNOSIS — R29898 Other symptoms and signs involving the musculoskeletal system: Secondary | ICD-10-CM

## 2017-07-12 DIAGNOSIS — M25512 Pain in left shoulder: Principal | ICD-10-CM

## 2017-07-12 DIAGNOSIS — M6281 Muscle weakness (generalized): Secondary | ICD-10-CM

## 2017-07-12 DIAGNOSIS — G8929 Other chronic pain: Secondary | ICD-10-CM

## 2017-07-12 NOTE — Therapy (Signed)
[facility] [address] [place], [place], [address] Phone: [phone]   Fax:  [phone]  Physical Therapy Treatment  Patient Details  Name: Kayshawn Ozburn MRN: [id number] Date of Birth: Jan 27, 1956 Referring Provider: Dr. Lily Kocher   Encounter Date: 07/12/2017      PT End of Session - 07/12/17 1340    Visit Number 6   Number of Visits 12   Date for PT Re-Evaluation 08/02/17   PT Start Time 1300   PT Stop Time 1355  took time to use restroom   PT Time Calculation (min) 55 min   Activity Tolerance Patient tolerated treatment well   Behavior During Therapy [facility] for tasks assessed/performed      Past Medical History:  Diagnosis Date  . Hypertension     History reviewed. No pertinent surgical history.  There were no vitals filed for this visit.      Subjective Assessment - 07/12/17 1311    Subjective More than usual pain today, 7/10.    Currently in Pain? Yes   Pain Score 7    Pain Location Shoulder   Pain Orientation Right;Left   Pain Descriptors / Indicators Aching   Pain Type Chronic pain   Pain Onset More than a month ago   Pain Frequency Constant   Aggravating Factors  moving arms    Pain Relieving Factors positioning improves             [facility] PT Assessment - 07/12/17 0001      Strength   Right Shoulder Flexion 3/5   Right Shoulder Internal Rotation 4+/5   Right Shoulder External Rotation 3+/5   Left Shoulder Flexion 3/5   Left Shoulder Internal Rotation 4/5   Left Shoulder External Rotation 4/5              [facility] Adult PT Treatment/Exercise - 07/12/17 0001      Shoulder Exercises: Supine   Horizontal ABduction Strengthening;Both;10 reps   External Rotation Strengthening;Both;10 reps   Flexion Strengthening;Both;10 reps     Shoulder Exercises: Seated   Retraction Strengthening;Both;15 reps   Flexion AAROM;Both;10 reps   Flexion Weight (lbs) Ranger   Abduction AAROM;Both;10 reps   ABduction Weight (lbs) Ranger    Other Seated Exercises UE Ranger: flexion , scaption and semi circles      Shoulder Exercises: Standing   Extension Strengthening;Both;15 reps   Theraband Level (Shoulder Extension) Level 2 (Red)   Row Strengthening;Both;15 reps   Theraband Level (Shoulder Row) Level 2 (Red)   Other Standing Exercises wall for postural cues      Shoulder Exercises: ROM/Strengthening   UBE (Upper Arm Bike) backwards 5 min level 1   cues for posture      Cryotherapy   Number Minutes Cryotherapy --   Cryotherapy Location --   Type of Cryotherapy --     Electrical Stimulation   Electrical Stimulation Location bilat. shoulder    Electrical Stimulation Action IFC   Electrical Stimulation Parameters to tol   Electrical Stimulation Goals Pain                     PT Long Term Goals - 07/12/17 1322      PT LONG TERM GOAL #1   Title Pt will be I with HEP as of last visit   Status On-going     PT LONG TERM GOAL #2   Title Pt will be able to lift each arm to 120 deg for improved ADLs,  home tasks, cooking meals with pain <4/10.    Baseline pain 7/10    Status On-going     PT LONG TERM GOAL #3   Title Pt will demo UE strength bilaterally to 4+/5 throughout to improve abiity to lift and use UEs for work.   Baseline 3+/5   Status On-going     PT LONG TERM GOAL #4   Title Pt will improve avg grip strength by 10 lbs each arm    Status Unable to assess     PT LONG TERM GOAL #5   Title Pt will be able to reach to lower back with each arm for improved ADLs, self care, pain minimal.    Status On-going               Plan - 07/12/17 1323    Clinical Impression Statement Patient cont wiht high levels of pain.  Strength not improved but AROM has. He does have back pain limits standing > 20 min and with walking very short distances.  Trial of IFC for pain control today.    PT Frequency 2x / week   PT Treatment/Interventions ADLs/Self Care Home  Management;Iontophoresis 4mg /ml Dexamethasone;Neuromuscular re-education;Dry needling;Manual techniques;Functional mobility training;Moist Heat;Cryotherapy;Electrical Stimulation;Balance training;Passive range of motion;Therapeutic exercise;Ultrasound;Therapeutic activities;Patient/family education;Taping   PT Next Visit Plan check HEP, manual to upper traps, AAROM, continue  ranger , pulley , UBE, try Supine scap stab?   PT Home Exercise Plan AAROM, scapular retraction, ER with green    Consulted and Agree with Plan of Care Patient      Patient will benefit from skilled therapeutic intervention in order to improve the following deficits and impairments:  Decreased activity tolerance, Decreased range of motion, Decreased strength, Impaired UE functional use, Impaired flexibility, Postural dysfunction, Decreased mobility, Increased fascial restricitons, Pain  Visit Diagnosis: Chronic left shoulder pain  Chronic right shoulder pain  Muscle weakness (generalized)  Other symptoms and signs involving the musculoskeletal system     Problem List Patient Active Problem List   Diagnosis Date Noted  . Hyperlipidemia 03/29/2017  . Hypertension 03/27/2017    Jarid Sasso 07/12/2017, 1:43 PM  [facility] [address] [place], [place], [address] Phone: [phone]   Fax:  [phone]  Name: Judene Companionnthony Rubendall MRN: [id number] Date of Birth: Jun 05, 1956  Karie MainlandJennifer Skylie Hiott, PT 07/12/17 1:43 PM Phone: [phone] Fax: [phone]

## 2017-07-13 ENCOUNTER — Ambulatory Visit (INDEPENDENT_AMBULATORY_CARE_PROVIDER_SITE_OTHER): Payer: Self-pay | Admitting: Physician Assistant

## 2017-07-13 ENCOUNTER — Encounter (INDEPENDENT_AMBULATORY_CARE_PROVIDER_SITE_OTHER): Payer: Self-pay | Admitting: Physician Assistant

## 2017-07-13 VITALS — BP 149/81 | HR 74 | Temp 98.2°F | Wt 263.6 lb

## 2017-07-13 DIAGNOSIS — G4733 Obstructive sleep apnea (adult) (pediatric): Secondary | ICD-10-CM

## 2017-07-13 DIAGNOSIS — M25512 Pain in left shoulder: Secondary | ICD-10-CM

## 2017-07-13 DIAGNOSIS — M545 Low back pain: Secondary | ICD-10-CM

## 2017-07-13 DIAGNOSIS — G8929 Other chronic pain: Secondary | ICD-10-CM

## 2017-07-13 DIAGNOSIS — M25511 Pain in right shoulder: Secondary | ICD-10-CM

## 2017-07-13 MED ORDER — HYDROCODONE-ACETAMINOPHEN 5-325 MG PO TABS: 1.0000 | ORAL_TABLET | Freq: Three times a day (TID) | ORAL | 0 refills | Status: DC | PRN

## 2017-07-13 NOTE — Progress Notes (Signed)
Subjective:  Patient ID: Thomas Medina, male    DOB: 08/22/1956  Age: 61 y.o. MRN: [id number]  CC: shoulder pain  HPI  Thomas Medina is a 61 y.o. male with a medical history of HTN presents on f/u of bilateral shoulder pain. Had been referred to physical therapy and has thus far completed 6 sessions. Physical therapy reports improved aROM but no improvement in strength. Patient agrees with the physical therapist's report but adds continued severe pain in the right shoulder. Pain interrupts his sleep. Take hydrocodone with mild to moderate relief.      Outpatient Medications Prior to Visit  Medication Sig Dispense Refill  . amLODipine (NORVASC) 10 MG tablet Take 1 tablet (10 mg total) by mouth daily. 90 tablet 3  . cloNIDine (CATAPRES) 0.1 MG tablet Take 1 tablet (0.1 mg total) by mouth 2 (two) times daily. 60 tablet 5  . cyclobenzaprine (FLEXERIL) 10 MG tablet Take 1 tablet (10 mg total) by mouth at bedtime. 30 tablet 0  . HYDROcodone-acetaminophen (NORCO) 5-325 MG tablet Take 1 tablet by mouth every 6 (six) hours as needed for moderate pain. 28 tablet 0  . losartan (COZAAR) 100 MG tablet Take 1 tablet (100 mg total) by mouth daily. 90 tablet 3  . lovastatin (MEVACOR) 20 MG tablet Take 1 tablet (20 mg total) by mouth at bedtime. 90 tablet 3  . metoprolol succinate (TOPROL-XL) 50 MG 24 hr tablet Take 1 tablet (50 mg total) by mouth daily. Take with or immediately following a meal. 90 tablet 3  . naproxen (NAPROSYN) 500 MG tablet Take 1 tablet (500 mg total) by mouth 2 (two) times daily with a meal. 30 tablet 0  . sildenafil (VIAGRA) 100 MG tablet Take 1 tablet (100 mg total) by mouth daily as needed for erectile dysfunction. 30 tablet 3   No facility-administered medications prior to visit.      ROS Review of Systems  Constitutional: Negative for chills, fever and malaise/fatigue.  Eyes: Negative for blurred vision.  Respiratory: Negative for shortness of breath.    Cardiovascular: Negative for chest pain and palpitations.  Gastrointestinal: Negative for abdominal pain and nausea.  Genitourinary: Negative for dysuria and hematuria.  Musculoskeletal: Positive for back pain and joint pain. Negative for myalgias.  Skin: Negative for rash.  Neurological: Negative for tingling and headaches.  Psychiatric/Behavioral: Negative for depression. The patient is not nervous/anxious.     Objective:  Blood pressure (!) 149/81, pulse 74, temperature 98.2 F (36.8 C), temperature source Oral, weight 263 lb 9.6 oz (119.6 kg), SpO2 94 %.  Physical Exam  Constitutional: He is oriented to person, place, and time.  Well developed, obese, NAD, polite  HENT:  Head: Normocephalic and atraumatic.  Eyes: No scleral icterus.  Neck: Normal range of motion. Neck supple. No thyromegaly present.  Cardiovascular: Normal rate, regular rhythm and normal heart sounds.   Pulmonary/Chest: Effort normal and breath sounds normal.  Musculoskeletal: He exhibits no edema.  Right shoulder with limited aROM  Neurological: He is alert and oriented to person, place, and time. No cranial nerve deficit. Coordination normal.  Skin: Skin is warm and dry. No rash noted. No erythema. No pallor.  Psychiatric: He has a normal mood and affect. His behavior is normal. Thought content normal.  Vitals reviewed.    Assessment & Plan:   1. Pain of both shoulder joints - MR Shoulder Right Wo Contrast; Future - AMB referral to orthopedics - AMB referral to pain clinic - Refill HYDROcodone-acetaminophen (NORCO)  5-325 MG tablet; Take 1 tablet by mouth every 8 (eight) hours as needed for moderate pain.  Dispense: 45 tablet; Refill: 0  2. OSA (obstructive sleep apnea) - I personally called the sleep center and was told they have forgotten to send the equipment order form to us. They will send order form to us and I will fax back to have patient receive his ventilator. Patient already given his mask.    3. Chronic bilateral low back pain without sciatica    Meds ordered this encounter  Medications  . HYDROcodone-acetaminophen (NORCO) 5-325 MG tablet    Sig: Take 1 tablet by mouth every 8 (eight) hours as needed for moderate pain.    Dispense:  45 tablet    Refill:  0    Order Specific Question:   Supervising Provider    Answer:   Quentin AngstJEGEDE, OLUGBEMIGA E [[id number]]    Follow-up: No Follow-up on file.   Loletta Specteroger David Rihana Kiddy PA

## 2017-07-13 NOTE — Patient Instructions (Signed)

## 2017-07-18 ENCOUNTER — Ambulatory Visit: Payer: Self-pay | Attending: Physician Assistant | Admitting: Physical Therapy

## 2017-07-18 ENCOUNTER — Encounter: Payer: Self-pay | Admitting: Physical Therapy

## 2017-07-18 DIAGNOSIS — R29898 Other symptoms and signs involving the musculoskeletal system: Secondary | ICD-10-CM | POA: Insufficient documentation

## 2017-07-18 DIAGNOSIS — M545 Low back pain: Secondary | ICD-10-CM | POA: Insufficient documentation

## 2017-07-18 DIAGNOSIS — G8929 Other chronic pain: Secondary | ICD-10-CM | POA: Insufficient documentation

## 2017-07-18 DIAGNOSIS — M25511 Pain in right shoulder: Secondary | ICD-10-CM | POA: Insufficient documentation

## 2017-07-18 DIAGNOSIS — M25512 Pain in left shoulder: Secondary | ICD-10-CM | POA: Insufficient documentation

## 2017-07-18 DIAGNOSIS — M6281 Muscle weakness (generalized): Secondary | ICD-10-CM | POA: Insufficient documentation

## 2017-07-18 NOTE — Patient Instructions (Signed)
Over Head Pull: Narrow Grip       On back, knees bent, feet flat, band across thighs, elbows straight but relaxed. Pull hands apart (start). Keeping elbows straight, bring arms up and over head, hands toward floor. Keep pull steady on band. Hold momentarily. Return slowly, keeping pull steady, back to start. Repeat   10___ times. Band color __  Yellow____   Side Pull: Double Arm   On back, knees bent, feet flat. Arms perpendicular to body, shoulder level, elbows straight but relaxed. Pull arms out to sides, elbows straight. Resistance band comes across collarbones, hands toward floor. Hold momentarily. Slowly return to starting position. Repeat __ 10 _ times. Band color __  Yellow___   Sash   On back, knees bent, feet flat, left hand on left hip, right hand above left. Pull right arm DIAGONALLY (hip to shoulder) across chest. Bring right arm along head toward floor. Hold momentarily. Slowly return to starting position. Repeat   10___ times. Do with left arm. Band color ___  Yellow___   Shoulder Rotation: Double Arm   On back, knees bent, feet flat, elbows tucked at sides, bent 90, hands palms up. Pull hands apart and down toward floor, keeping elbows near sides. Hold momentarily. Slowly return to starting position. Repeat _10__ times. Band color ___Yellow___   

## 2017-07-18 NOTE — Therapy (Signed)
[facility] [address] [place], [place], [address] Phone: [phone]   Fax:  [phone]  Physical Therapy Treatment  Patient Details  Name: Thomas Medina Mirante MRN: [id number] Date of Birth: 1955/11/01 Referring Provider: Dr. Lily KocherGomez    Encounter Date: 07/18/2017  PT End of Session - 07/18/17 1405    Visit Number  7    Number of Visits  12    Date for PT Re-Evaluation  08/02/17    PT Start Time  1330    PT Stop Time  1400 shortsession at request of patient due to needing to catch a ride.    shortsession at request of patient due to needing to catch a ride.    PT Time Calculation (min)  30 min    Activity Tolerance  Patient tolerated treatment well    Behavior During Therapy  WFL for tasks assessed/performed       Past Medical History:  Diagnosis Date  . Hypertension     History reviewed. No pertinent surgical history.  There were no vitals filed for this visit.  Subjective Assessment - 07/18/17 1337    Subjective  5-6/10 pain .  Tries to avoid pain at home.  Goes to pain management tomorrow,  has an MRI ordered.      Currently in Pain?  Yes    Pain Score  9  Left > Right   Left > Right   Pain Location  Shoulder    Pain Orientation  Right;Left    Pain Descriptors / Indicators  Aching    Pain Type  Chronic pain    Pain Frequency  Constant    Aggravating Factors   moving arms    Pain Relieving Factors  Medication    Multiple Pain Sites  -- back is 8/10   back is 8/10                     [facility] Adult PT Treatment/Exercise - 07/18/17 0001      Shoulder Exercises: Seated   Row  10 reps 2 sets , yelloe band   2 sets , yelloe band   Other Seated Exercises  supine scapular stabilization all practiced and issued. for HEP Yellow band.  Patiet was surprised he could do without a lot of pain increase.        Shoulder Exercises: Pulleys   Flexion  3 minutes      Shoulder Exercises: ROM/Strengthening   UBE  (Upper Arm Bike)  backwards 5 min level 1  cues for posture    cues for posture             PT Education - 07/18/17 1358    Education provided  Yes    Education Details  HEP    Person(s) Educated  Patient    Methods  Explanation;Demonstration;Tactile cues;Verbal cues;Handout    Comprehension  Verbalized understanding;Returned demonstration          PT Long Term Goals - 07/18/17 1408      PT LONG TERM GOAL #1   Title  Pt will be I with HEP as of last visit    Baseline  independent with ex so far    Time  6    Period  Weeks    Status  On-going      PT LONG TERM GOAL #2   Title  Pt will be able to lift each arm to 120 deg for improved ADLs, home tasks, cooking meals with  pain <4/10.     Baseline  9/10 pain,  ROM not measured    Time  6    Period  Weeks    Status  On-going      PT LONG TERM GOAL #3   Title  Pt will demo UE strength bilaterally to 4+/5 throughout to improve abiity to lift and use UEs for work.    Time  6    Period  Weeks    Status  Unable to assess      PT LONG TERM GOAL #4   Title  Pt will improve avg grip strength by 10 lbs each arm     Time  6    Period  Weeks    Status  Unable to assess      PT LONG TERM GOAL #5   Title  Pt will be able to reach to lower back with each arm for improved ADLs, self care, pain minimal.     Time  6    Period  Weeks    Status  Unable to assess            Plan - 07/18/17 1405    Clinical Impression Statement  Pain unchanged at end of session.  he was able to progress the HEP to include scapular stabilar stabilization.  Short session dur to patient needing to catch a ride.      PT Next Visit Plan  check HEP, manual to upper traps, AAROM, continue  ranger , pulley , UBE, review Supine scap stab?    PT Home Exercise Plan  AAROM, scapular retraction, ER with green ,  supine scapular stabilization, yellow band.  Consider sitting cane shoulder extension,  and IR. to work on LTG for reaching low back,  Self mobs??     Consulted and Agree with Plan of Care  Patient       Patient will benefit from skilled therapeutic intervention in order to improve the following deficits and impairments:     Visit Diagnosis: Chronic left shoulder pain  Chronic right shoulder pain  Muscle weakness (generalized)  Other symptoms and signs involving the musculoskeletal system     Problem List Patient Active Problem List   Diagnosis Date Noted  . Hyperlipidemia 03/29/2017  . Hypertension 03/27/2017    HARRIS,KAREN  PTA 07/18/2017, 2:12 PM  [facility] [address] [place], [place], [address] Phone: [phone]   Fax:  [phone]  Name: Thomas Medina Simpson MRN: [id number] Date of Birth: 1955/12/07

## 2017-07-19 ENCOUNTER — Encounter (INDEPENDENT_AMBULATORY_CARE_PROVIDER_SITE_OTHER): Payer: Self-pay | Admitting: Orthopedic Surgery

## 2017-07-19 ENCOUNTER — Ambulatory Visit (INDEPENDENT_AMBULATORY_CARE_PROVIDER_SITE_OTHER): Payer: Self-pay | Admitting: Orthopedic Surgery

## 2017-07-19 DIAGNOSIS — M545 Low back pain, unspecified: Secondary | ICD-10-CM

## 2017-07-19 DIAGNOSIS — M7540 Impingement syndrome of unspecified shoulder: Secondary | ICD-10-CM

## 2017-07-19 DIAGNOSIS — G8929 Other chronic pain: Secondary | ICD-10-CM

## 2017-07-19 NOTE — Progress Notes (Signed)
Office Visit Note   Patient: Thomas Medina           Date of Birth: Oct 26, 1955           MRN: [id number] Visit Date: 07/19/2017 Requested by: Loletta SpecterGomez, Roger David, PA-C [address] [place], [place] [address] PCP: Denny LevyGomez, Roger David, PA-C  Subjective: Chief Complaint  Patient presents with  . Shoulder Pain    bilateral  . Back Pain    lumbar    HPI: Thomas Medina is a 61 year old patient with bilateral shoulder pain and low back pain.  He states that both shoulders hurt the same.  Denies any radiating pain down below the elbow and denies any numbness and tingling.  States that the pain is constant.  Denies any history of injury.  Patient states that he wakes him sleep at night with the pain.  He denies any neck pain.  Patient also reports low back pain.  Reports pain after standing and walking short distances.  Denies any numbness and tingling or any radiation.  Radiographs on the computer are reviewed from Hospital.  Shoulder X-Rays Are Normal with Mild Degenerative Changes and No Acute Problem.  I Looked of the Studies and He Really Doesn't Have Much in the Way of Glenohumeral or Acromioclavicular Arthritis.  Acromiohumeral Distances Are Maintained.  In the Back Radiographs Mild Degenerative Changes Are Present.  Nothing Acute There Either.  Patient States He Is under the Impression That He Was Here for Pain Management.              ROS: All systems reviewed are negative as they relate to the chief complaint within the history of present illness.  Patient denies  fevers or chills.   Assessment & Plan: Visit Diagnoses:  1. Chronic midline low back pain without sciatica   2. Impingement syndrome of shoulder region, unspecified laterality     Plan: Impression is bilateral shoulder pain which likely represents a mild degree of impingement.  Does not seem to be coming from the neck.  Radiographs are normal.  Rotator cuff strength is excellent.  This likely represents bursitis.  I think he  should continue therapy to work on rotator cuff strengthening and to maintain his range of motion.  He states that hydrocodone worked for his pain but again were not pain management and do not do long-term opioid prescriptions as a general rule.  In regards to his back he has not had any physical therapy for his back.  I did write him for physical therapy for his back.  He may have some degree of facet arthritis and possible spinal stenosis.  For his back if therapy doesn't help I would recommend MRI scanning possible injections in the back.  Again I think his main interest at this time would be long-term pain management which we do not do I'll see him back as needed  Follow-Up Instructions: Return if symptoms worsen or fail to improve.   Orders:  Orders Placed This Encounter  Procedures  . Ambulatory referral to Physical Therapy   No orders of the defined types were placed in this encounter.     Procedures: No procedures performed   Clinical Data: No additional findings.  Objective: Vital Signs: There were no vitals taken for this visit.  Physical Exam:   Constitutional: Patient appears well-developed HEENT:  Head: Normocephalic Eyes:EOM are normal Neck: Normal range of motion Cardiovascular: Normal rate Pulmonary/chest: Effort normal Neurologic: Patient is alert Skin: Skin is warm Psychiatric: Patient has  normal mood and affect    Ortho Exam: Orthopedic exam demonstrates positive impingement signs bilaterally but no restriction of external rotation on either side.  Neck range of motion is full.  No paresthesias C5-T1.  Radial pulses intact bilaterally.  Rotator cuff strength is excellent to infraspinatus supraspinatus and subscapularis muscle testing.  No masses lymph adenopathy or skin changes noted in the shoulder girdle or neck region.  Back exam demonstrates no nerve retention signs no paresthesias L1 S1 bilaterally mild pitting edema bilateral lower extremities in the  ankles pedal pulses palpable.  Some pain with forward lateral bending.  Has a fair amount of lumbar lordosis with standing.  No trochanteric tenderness is noted.  Specialty Comments:  No specialty comments available.  Imaging: No results found.   PMFS History: Patient Active Problem List   Diagnosis Date Noted  . Hyperlipidemia 03/29/2017  . Hypertension 03/27/2017   Past Medical History:  Diagnosis Date  . Hypertension     History reviewed. No pertinent family history.  History reviewed. No pertinent surgical history. Social History   Occupational History  . Not on file  Tobacco Use  . Smoking status: Current Every Day Smoker    Packs/day: 0.50    Types: Cigarettes  . Smokeless tobacco: Never Used  Substance and Sexual Activity  . Alcohol use: Yes    Comment: occ  . Drug use: No  . Sexual activity: Not on file

## 2017-07-20 ENCOUNTER — Ambulatory Visit: Payer: Self-pay | Admitting: Physical Therapy

## 2017-07-20 DIAGNOSIS — G8929 Other chronic pain: Secondary | ICD-10-CM

## 2017-07-20 DIAGNOSIS — M6281 Muscle weakness (generalized): Secondary | ICD-10-CM

## 2017-07-20 DIAGNOSIS — M25511 Pain in right shoulder: Secondary | ICD-10-CM

## 2017-07-20 DIAGNOSIS — R29898 Other symptoms and signs involving the musculoskeletal system: Secondary | ICD-10-CM

## 2017-07-20 DIAGNOSIS — M25512 Pain in left shoulder: Principal | ICD-10-CM

## 2017-07-20 NOTE — Therapy (Signed)
[facility] [address] [place], [place], [address] Phone: [phone]   Fax:  [phone]  Physical Therapy Treatment And Evaluation  Patient Details  Name: Thomas Medina Thedford MRN: [id number] Date of Birth: 11-30-1955 Referring Provider: Dr, Lily KocherGomez, Dr. August Saucerean    Encounter Date: 07/20/2017  PT End of Session - 07/20/17 1420    Visit Number  8    Number of Visits  20    Date for PT Re-Evaluation  08/31/17    PT Start Time  1333    PT Stop Time  1430    PT Time Calculation (min)  57 min    Activity Tolerance  Patient tolerated treatment well    Behavior During Therapy  [facility] for tasks assessed/performed       Past Medical History:  Diagnosis Date  . Hypertension     No past surgical history on file.  There were no vitals filed for this visit.  Subjective Assessment - 07/20/17 1333    Subjective  I went to see Dr. August Saucerean and he referred me for my back.  Shoulders are stiff.      Limitations  Standing;Walking;Sitting;Lifting self limiting, avoids due to hernia     How long can you sit comfortably?  sitting too long , legs go asleep after 20 min , then hard to stand up fully.     How long can you stand comfortably?  standing limited to 10 min max    How long can you walk comfortably?  1/8 of mile very difficult, was his normal routine, 15-20 min , pain was excrucriatng     Diagnostic tests  None yet.  XR for Lumbar spine.     Patient Stated Goals  Patient would like to be able to lift arms up and have less pain Regarding his back, standing and walking with less pain.     Currently in Pain?  Yes    Pain Score  7     Pain Location  Shoulder    Pain Orientation  Right;Left    Pain Descriptors / Indicators  Tightness    Pain Type  Chronic pain    Pain Onset  More than a month ago    Pain Frequency  Constant    Pain Score  5    Pain Location  Back    Pain Orientation  Right;Left    Pain Descriptors / Indicators  Tightness     Pain Type  Chronic pain    Pain Radiating Towards  periodically tingling and numbness     Pain Onset  More than a month ago    Pain Frequency  Intermittent    Aggravating Factors   standing, walking     Pain Relieving Factors  sitting, meds a bit, hydrocodone    Effect of Pain on Daily Activities  limits his ability to walk and lose weight         [facility] - 07/20/17 0001      Assessment   Medical Diagnosis  lumbar     Referring Provider  Dr, Lily KocherGomez, Dr. August Saucerean       Balance Screen   Has the patient fallen in the past 6 months  No      Home Environment   Living Environment  Private residence    Living Arrangements  Alone    Type of Home  Apartment    Home Access  Stairs to enter    Entrance Stairs-Number of Steps  20    Entrance Stairs-Rails  Right    Home Layout  One level      Prior Function   Vocation  Part time employment    Vocation Requirements  power/pressure washing business     Leisure  grandkids, walking, works, Community education officercooking      Cognition   Overall Cognitive Status  Within Functional Limits for tasks assessed      Observation/Other Assessments   Focus on Therapeutic Outcomes (FOTO)   59% shoulder     Posture/Postural Control   Posture/Postural Control  Postural limitations    Postural Limitations  Rounded Shoulders;Forward head;Increased thoracic kyphosis    Posture Comments  obese abdomen and significant abdominal hernia protruding, patient very concerned       AROM   Lumbar Flexion  75%    Lumbar Extension  90%    Lumbar - Right Side Bend  pain on R 75%    Lumbar - Left Side Bend  pain on L 75%    Lumbar - Right Rotation  50% pain mid back     Lumbar - Left Rotation  50% pain mid back       Strength   Right Hip Flexion  4-/5    Right Hip ABduction  3/5    Left Hip Flexion  3+/5    Left Hip ABduction  3+/5    Right Knee Flexion  3+/5    Right Knee Extension  5/5    Left Knee Flexion  4+/5    Left Knee Extension  5/5    Right Ankle  Dorsiflexion  5/5    Left Ankle Dorsiflexion  5/5      Flexibility   Hamstrings  tight, 45-50 deg pain in back       Palpation   Palpation comment  tender across Low lumbar spine into superior gluteals                   [facility] Adult PT Treatment/Exercise - 07/20/17 0001      Lumbar Exercises: Stretches   Active Hamstring Stretch Limitations  HEP     Single Knee to Chest Stretch  2 reps;30 seconds    Lower Trunk Rotation  10 seconds    Lower Trunk Rotation Limitations  x 10     Quadruped Mid Back Stretch  3 reps;30 seconds      Lumbar Exercises: Aerobic   Stationary Bike  8 min L5 UE and LE                   PT Long Term Goals - 07/20/17 1429      PT LONG TERM GOAL #1   Title  Pt will be I with HEP as of last visit    Status  On-going    Target Date  08/31/17      PT LONG TERM GOAL #2   Title  Pt will be able to lift each arm to 120 deg for improved ADLs, home tasks, cooking meals with pain <4/10.     Status  On-going      PT LONG TERM GOAL #3   Title  Pt will demo UE strength bilaterally to 4+/5 throughout to improve abiity to lift and use UEs for work.    Status  On-going      PT LONG TERM GOAL #4   Title  Pt will improve avg grip strength by 10 lbs each arm     Status  Unable to assess  PT LONG TERM GOAL #5   Title  Pt will be able to reach to lower back with each arm for improved ADLs, self care, pain minimal.     Status  On-going      PT LONG TERM GOAL #6   Title  Pt will be able to stand for 10 min without increasing back pain >5/10 for kitchen cleanup, cooking     Time  6    Period  Weeks    Status  New    Target Date  08/31/17      PT LONG TERM GOAL #7   Title  Pt will be able to walk 1/8 mile in 15-20 min with pain <5/10 on a regular basis.     Time  6    Period  Weeks    Status  New    Target Date  08/31/17            Plan - 07/20/17 1432    Clinical Impression Statement  Patient evaluated for lumbar pain today, he  has significant stiffness in spine in all directions, LE seem quite strong, except for hip abduction bilateral.  Much of todays session was spent on his questions related to his hernia, he is concerned and very uncomfortable.  He knows MD is aware but wonders if he needs surgery. He is limited in his efforts to improve his heath and conditioning by his back pain.  We will continue to treat him for bilateral impingment in shoulders and also now low back pain.     Rehab Potential  Good    PT Frequency  2x / week    PT Duration  6 weeks    PT Treatment/Interventions  ADLs/Self Care Home Management;Iontophoresis 4mg /ml Dexamethasone;Neuromuscular re-education;Dry needling;Manual techniques;Functional mobility training;Moist Heat;Cryotherapy;Electrical Stimulation;Balance training;Passive range of motion;Therapeutic exercise;Ultrasound;Therapeutic activities;Patient/family education;Taping    PT Next Visit Plan  check HEP, manual to upper traps, AAROM, continue  ranger , pulley , UBE, review Supine scap stab?    PT Home Exercise Plan  AAROM, scapular retraction, ER with green ,  supine scapular stabilization, yellow band.  Consider sitting cane shoulder extension,  and IR. to work on LTG for reaching low back,  Self mobs, LTR, S KTC, seated hamstring, quadruped     Consulted and Agree with Plan of Care  Patient       Patient will benefit from skilled therapeutic intervention in order to improve the following deficits and impairments:  Decreased activity tolerance, Decreased range of motion, Decreased strength, Impaired UE functional use, Impaired flexibility, Postural dysfunction, Decreased mobility, Increased fascial restricitons, Pain, Impaired sensation, Difficulty walking, Obesity  Visit Diagnosis: Chronic left shoulder pain  Muscle weakness (generalized)  Other symptoms and signs involving the musculoskeletal system  Chronic right shoulder pain     Problem List Patient Active Problem List    Diagnosis Date Noted  . Hyperlipidemia 03/29/2017  . Hypertension 03/27/2017    Giavonni Cizek 07/20/2017, 2:40 PM  [facility] [address] [place], [place], [address] Phone: [phone]   Fax:  [phone]  Name: Naod Sweetland MRN: [id number] Date of Birth: 11-Jul-1956   Karie Mainland, PT 07/20/17 2:41 PM Phone: [phone] Fax: [phone]

## 2017-07-27 MED FILL — !VIAGRA 100 MG TABLET: 100 MG | 30 days supply | Qty: 10 | Fill #2

## 2017-07-27 MED FILL — AMLODIPINE BESYLATE 10 MG T: 10 | 30 days supply | Qty: 30 | Fill #3

## 2017-07-27 MED FILL — HYDROCHLOROTHIAZIDE 25 MG T: 25 | 30 days supply | Qty: 30 | Fill #3

## 2017-07-27 MED FILL — METOPROLOL SUCC ER 50 MG TA: 50 | 30 days supply | Qty: 30 | Fill #3

## 2017-07-27 MED FILL — LOSARTAN POTASSIUM 100 MG T: 100 | 30 days supply | Qty: 30 | Fill #3

## 2017-07-27 MED FILL — LOVASTATIN 20 MG TABLET: 20 | 30 days supply | Qty: 30 | Fill #3

## 2017-07-27 MED FILL — ?CLONIDINE HCL 0.1 MG TABL: 0.1 | 30 days supply | Qty: 60 | Fill #2

## 2017-07-31 ENCOUNTER — Ambulatory Visit: Payer: Self-pay

## 2017-08-07 ENCOUNTER — Encounter: Payer: Self-pay | Admitting: Physical Medicine & Rehabilitation

## 2017-08-07 ENCOUNTER — Ambulatory Visit: Payer: Self-pay

## 2017-08-10 ENCOUNTER — Encounter: Payer: Self-pay | Admitting: Rehabilitation

## 2017-08-10 ENCOUNTER — Ambulatory Visit: Payer: Self-pay | Admitting: Rehabilitation

## 2017-08-10 DIAGNOSIS — M545 Low back pain: Secondary | ICD-10-CM

## 2017-08-10 DIAGNOSIS — R29898 Other symptoms and signs involving the musculoskeletal system: Secondary | ICD-10-CM

## 2017-08-10 DIAGNOSIS — M6281 Muscle weakness (generalized): Secondary | ICD-10-CM

## 2017-08-10 DIAGNOSIS — M25511 Pain in right shoulder: Secondary | ICD-10-CM

## 2017-08-10 DIAGNOSIS — M25512 Pain in left shoulder: Principal | ICD-10-CM

## 2017-08-10 DIAGNOSIS — G8929 Other chronic pain: Secondary | ICD-10-CM

## 2017-08-10 NOTE — Therapy (Addendum)
Thomas Medina, [place], [address] Phone: [phone]   Fax:  [phone]  Physical Therapy Treatment/Addended Discharge  Patient Details  Name: Thomas Medina MRN: [id number] Date of Birth: Jun 21, 1956 Referring Provider: Dr, Altamease Oiler, Dr. Marlou Sa    Encounter Date: 08/10/2017  PT End of Session - 08/10/17 1409    Visit Number  9    Number of Visits  20    Date for PT Re-Evaluation  08/31/17    PT Start Time  1331    PT Stop Time  1412    PT Time Calculation (min)  41 min    Activity Tolerance  Patient tolerated treatment well    Behavior During Therapy  [facility] for tasks assessed/performed       Past Medical History:  Diagnosis Date  . Hypertension     History reviewed. No pertinent surgical history.  There were no vitals filed for this visit.  Subjective Assessment - 08/10/17 1324    Subjective  I have been working my self out.  Still concerned about the hernia.      Patient Stated Goals  Patient would like to be able to lift arms up and have less pain Regarding his back, standing and walking with less pain.     Currently in Pain?  Yes    Pain Score  7     Pain Location  Shoulder    Pain Orientation  Right;Left    Pain Descriptors / Indicators  Tightness    Pain Onset  More than a month ago    Pain Frequency  Constant    Aggravating Factors   moving arms    Pain Relieving Factors  medication    Pain Score  3    Pain Location  Back    Pain Orientation  Mid;Lower    Pain Descriptors / Indicators  Tightness    Pain Onset  More than a month ago    Pain Frequency  Intermittent    Aggravating Factors   standing and walking    Pain Relieving Factors  sitting, meds, hydrocodone                      [facility] Adult PT Treatment/Exercise - 08/10/17 0001      Exercises   Exercises  Lumbar      Lumbar Exercises: Stretches   Lower Trunk Rotation  10 seconds    Lower Trunk Rotation Limitations   x 10       Lumbar Exercises: Supine   Bent Knee Raise  10 reps    Bent Knee Raise Limitations  bil     Bridge  10 reps mini    Other Supine Lumbar Exercises  hooklying TA with ball squeeze 6"x10      Shoulder Exercises: Supine   Protraction  15 reps 3# bil    Other Supine Exercises  horizontal abduction red x 20      Shoulder Exercises: Standing   Extension  Strengthening;Both;15 reps    Theraband Level (Shoulder Extension)  Level 2 (Red)    Row  Strengthening;Both;15 reps    Theraband Level (Shoulder Row)  Level 2 (Red)    Other Standing Exercises  bil ER red x 15      Shoulder Exercises: Pulleys   Flexion  3 minutes      Shoulder Exercises: ROM/Strengthening   UBE (Upper Arm Bike)  F/B x 30mn each level 2  Manual Therapy   Manual therapy comments  bil stretching hamstrings, SKTC, and piriformis                  PT Long Term Goals - 07/20/17 1429      PT LONG TERM GOAL #1   Title  Pt will be I with HEP as of last visit    Status  On-going    Target Date  08/31/17      PT LONG TERM GOAL #2   Title  Pt will be able to lift each arm to 120 deg for improved ADLs, home tasks, cooking meals with pain <4/10.     Status  On-going      PT LONG TERM GOAL #3   Title  Pt will demo UE strength bilaterally to 4+/5 throughout to improve abiity to lift and use UEs for work.    Status  On-going      PT LONG TERM GOAL #4   Title  Pt will improve avg grip strength by 10 lbs each arm     Status  Unable to assess      PT LONG TERM GOAL #5   Title  Pt will be able to reach to lower back with each arm for improved ADLs, self care, pain minimal.     Status  On-going      PT LONG TERM GOAL #6   Title  Pt will be able to stand for 10 min without increasing back pain >5/10 for kitchen cleanup, cooking     Time  6    Period  Weeks    Status  New    Target Date  08/31/17      PT LONG TERM GOAL #7   Title  Pt will be able to walk 1/8 mile in 15-20 min with pain <5/10 on  a regular basis.     Time  6    Period  Weeks    Status  New    Target Date  08/31/17            Plan - 08/10/17 1353    Clinical Impression Statement  Pt tolerated all shoulder and back activities well today.  Unable to perform [facility] due to abdominal size, does have increased hernia size with increased abdominal pressure which may hinder alot of core strengthening supine.      PT Frequency  2x / week    PT Duration  6 weeks    PT Treatment/Interventions  ADLs/Self Care Home Management;Iontophoresis '4mg'$ /ml Dexamethasone;Neuromuscular re-education;Dry needling;Manual techniques;Functional mobility training;Moist Heat;Cryotherapy;Electrical Stimulation;Balance training;Passive range of motion;Therapeutic exercise;Ultrasound;Therapeutic activities;Patient/family education;Taping    PT Next Visit Plan  check HEP, manual to upper traps, AAROM, continue  ranger , pulley , UBE, review Supine scap stab?    PT Home Exercise Plan  AAROM, scapular retraction, ER with green ,  supine scapular stabilization, yellow band.  Consider sitting cane shoulder extension,  and IR. to work on LTG for reaching low back,  Self mobs, LTR, S KTC, seated hamstring, quadruped     Consulted and Agree with Plan of Care  Patient       Patient will benefit from skilled therapeutic intervention in order to improve the following deficits and impairments:  Decreased activity tolerance, Decreased range of motion, Decreased strength, Impaired UE functional use, Impaired flexibility, Postural dysfunction, Decreased mobility, Increased fascial restricitons, Pain, Impaired sensation, Difficulty walking, Obesity  Visit Diagnosis: Chronic left shoulder pain  Muscle weakness (generalized)  Other symptoms  and signs involving the musculoskeletal system  Chronic right shoulder pain  Chronic midline low back pain without sciatica     Problem List Patient Active Problem List   Diagnosis Date Noted  . Hyperlipidemia  03/29/2017  . Hypertension 03/27/2017    Stark Bray, DPT, CMP 08/10/2017, 2:14 PM  [facility] [address] [place], [place], [address] Phone: [phone]   Fax:  [phone]  Name: Thomas Medina MRN: [id number] Date of Birth: 12/22/1955    PHYSICAL THERAPY DISCHARGE SUMMARY  Visits from Start of Care: 9  Current functional level related to goals / functional outcomes: Unknown, have not seen since this visit.  See above for most recent info.    Remaining deficits: See above    Education / Equipment: HEP, shoulder, posture, began back education and body mechanics.  Plan: Patient agrees to discharge.  Patient goals were partially met. Patient is being discharged due to not returning since the last visit.  ?????     Raeford Razor, PT 09/14/17 9:55 AM Phone: [phone] Fax: [phone]

## 2017-08-16 ENCOUNTER — Other Ambulatory Visit: Payer: Self-pay

## 2017-08-16 ENCOUNTER — Ambulatory Visit (INDEPENDENT_AMBULATORY_CARE_PROVIDER_SITE_OTHER): Payer: Self-pay | Admitting: Physician Assistant

## 2017-08-16 ENCOUNTER — Encounter (INDEPENDENT_AMBULATORY_CARE_PROVIDER_SITE_OTHER): Payer: Self-pay | Admitting: Physician Assistant

## 2017-08-16 VITALS — BP 121/69 | HR 75 | Temp 97.8°F | Wt 267.2 lb

## 2017-08-16 DIAGNOSIS — G4733 Obstructive sleep apnea (adult) (pediatric): Secondary | ICD-10-CM

## 2017-08-16 DIAGNOSIS — Z114 Encounter for screening for human immunodeficiency virus [HIV]: Secondary | ICD-10-CM

## 2017-08-16 DIAGNOSIS — Z1159 Encounter for screening for other viral diseases: Secondary | ICD-10-CM

## 2017-08-16 DIAGNOSIS — M25511 Pain in right shoulder: Secondary | ICD-10-CM

## 2017-08-16 DIAGNOSIS — G8929 Other chronic pain: Secondary | ICD-10-CM

## 2017-08-16 DIAGNOSIS — M25512 Pain in left shoulder: Secondary | ICD-10-CM

## 2017-08-16 DIAGNOSIS — Z1211 Encounter for screening for malignant neoplasm of colon: Secondary | ICD-10-CM

## 2017-08-16 MED ORDER — HYDROCODONE-ACETAMINOPHEN 5-325 MG PO TABS: 1.0000 | ORAL_TABLET | Freq: Two times a day (BID) | ORAL | 0 refills | Status: DC | PRN

## 2017-08-16 NOTE — Progress Notes (Signed)
Subjective:  Patient ID: Thomas Medina, male    DOB: Mar 24, 1956  Age: 61 y.o. MRN: [id number]  CC: f/u joint pain  HPI Thomas Medina is a 61 y.o. male with a medical history of hypertension presents with joint pain. Seen one month ago at orthopedics and was diagnosed with impingement syndrome of shoulder region. Has been attending physical therapy for left shoulder with some improvement (30-40% improvement). Still has pain when laying on his side. Failed on tylenol #3 and tramadol for pain relief. Says he is getting adequate pain relief with Norco 5-325 mg.  Has not gotten his MRI of the shoulder done yet. Says he did not receive a call with appointment details.      In regards to sleep apnea, he was called and told he needed insurance to pick up his equipment. Patient currently has [employer] card and [employer] [facility] [employer].       Outpatient Medications Prior to Visit  Medication Sig Dispense Refill  . amLODipine (NORVASC) 10 MG tablet Take 1 tablet (10 mg total) by mouth daily. 90 tablet 3  . cloNIDine (CATAPRES) 0.1 MG tablet Take 1 tablet (0.1 mg total) by mouth 2 (two) times daily. 60 tablet 5  . cyclobenzaprine (FLEXERIL) 10 MG tablet Take 1 tablet (10 mg total) by mouth at bedtime. 30 tablet 0  . losartan (COZAAR) 100 MG tablet Take 1 tablet (100 mg total) by mouth daily. 90 tablet 3  . lovastatin (MEVACOR) 20 MG tablet Take 1 tablet (20 mg total) by mouth at bedtime. 90 tablet 3  . metoprolol succinate (TOPROL-XL) 50 MG 24 hr tablet Take 1 tablet (50 mg total) by mouth daily. Take with or immediately following a meal. 90 tablet 3  . naproxen (NAPROSYN) 500 MG tablet Take 1 tablet (500 mg total) by mouth 2 (two) times daily with a meal. 30 tablet 0  . sildenafil (VIAGRA) 100 MG tablet Take 1 tablet (100 mg total) by mouth daily as needed for erectile dysfunction. 30 tablet 3  . HYDROcodone-acetaminophen (NORCO) 5-325 MG tablet Take 1 tablet by mouth every 8 (eight) hours as needed for  moderate pain. (Patient not taking: Reported on 08/16/2017) 45 tablet 0   No facility-administered medications prior to visit.      ROS Review of Systems  Constitutional: Negative for chills, fever and malaise/fatigue.  Eyes: Negative for blurred vision.  Respiratory: Negative for shortness of breath.   Cardiovascular: Negative for chest pain and palpitations.  Gastrointestinal: Negative for abdominal pain and nausea.  Genitourinary: Negative for dysuria and hematuria.  Musculoskeletal: Positive for joint pain. Negative for myalgias.  Skin: Negative for rash.  Neurological: Negative for tingling and headaches.  Psychiatric/Behavioral: Negative for depression. The patient is not nervous/anxious.     Objective:  BP 121/69 (BP Location: Left Arm, Patient Position: Sitting, Cuff Size: Large)   Pulse 75   Temp 97.8 F (36.6 C) (Oral)   Wt 267 lb 3.2 oz (121.2 kg)   SpO2 93%   BMI 39.46 kg/m   BP/Weight 08/16/2017 07/13/2017 06/23/2017  Systolic BP 121 149 -  Diastolic BP 69 81 -  Wt. (Lbs) 267.2 263.6 257  BMI 39.46 38.93 37.95      Physical Exam  Constitutional: He is oriented to person, place, and time.  Well developed, obese, NAD, polite  HENT:  Head: Normocephalic and atraumatic.  Eyes: No scleral icterus.  Cardiovascular: Normal rate, regular rhythm and normal heart sounds.  Pulmonary/Chest: Effort normal and breath sounds normal.  Musculoskeletal: He exhibits no edema.  Bilateral shoulder abduction and flexion limited to approximately 95 degrees secondary to pain  Neurological: He is alert and oriented to person, place, and time. No cranial nerve deficit. Coordination normal.  Skin: Skin is warm and dry. No rash noted. No erythema. No pallor.  Psychiatric: He has a normal mood and affect. His behavior is normal. Thought content normal.  Vitals reviewed.    Assessment & Plan:   1. Chronic pain of both shoulders - HYDROcodone-acetaminophen (NORCO/VICODIN) 5-325  MG tablet; Take 1 tablet by mouth every 12 (twelve) hours as needed for moderate pain.  Dispense: 40 tablet; Refill: 0 - Continue attending physical therapy - We will call him today with appointment details for his MRI  2. OSA (obstructive sleep apnea) - I had patient call sleep center and then [facility] while in clinic to resolve the issue of why patient has not received a BiPAP machine. [facility] then transferred to an ASAA program specialist. [employer]AA is a program that will provide patient with the BiPAP for a $100 application fee. Specialist said he will also fax me the paperwork needed for the ASAA program. Patient said he will try to gather the $100 dollar application fee.   3. Screening for colon cancer - Fecal occult blood, imunochemical  4. Screening for HIV (human immunodeficiency virus) - HIV antibody  5. Need for hepatitis C screening test - Hepatitis c antibody (reflex)  *greater than 45 minutes spent coordinating care.     Meds ordered this encounter  Medications  . HYDROcodone-acetaminophen (NORCO/VICODIN) 5-325 MG tablet    Sig: Take 1 tablet by mouth every 12 (twelve) hours as needed for moderate pain.    Dispense:  40 tablet    Refill:  0    Order Specific Question:   Supervising Provider    Answer:   Quentin AngstJEGEDE, OLUGBEMIGA E [id number]]    Follow-up: Return in about 8 weeks (around 10/11/2017) for shoulder pain, OSA.   Loletta Specteroger David Adisson Deak PA

## 2017-08-16 NOTE — Patient Instructions (Signed)
Shoulder Impingement Syndrome Shoulder impingement syndrome is a condition that causes pain when connective tissues (tendons) surrounding the shoulder joint become pinched. These tendons are part of the group of muscles and tissues that help to stabilize the shoulder (rotator cuff). Beneath the rotator cuff is a fluid-filled sac (bursa) that allows the muscles and tendons to glide smoothly. The bursa may become swollen or irritated (bursitis). Bursitis, swelling in the rotator cuff tendons, or both conditions can decrease how much space is under a bone in the shoulder joint (acromion), resulting in impingement. What are the causes? Shoulder impingement syndrome can be caused by bursitis or swelling of the rotator cuff tendons, which may result from:  Repetitive overhead arm movements.  Falling onto the shoulder.  Weakness in the shoulder muscles.  What increases the risk? You may be more likely to develop this condition if you are an athlete who participates in:  Sports that involve throwing, such as baseball.  Tennis.  Swimming.  Volleyball.  Some people are also more likely to develop impingement syndrome because of the shape of their acromion bone. What are the signs or symptoms? The main symptom of this condition is pain on the front or side of the shoulder. Pain may:  Get worse when lifting or raising the arm.  Get worse at night.  Wake you up from sleeping.  Feel sharp when the shoulder is moved, and then fade to an ache.  Other signs and symptoms may include:  Tenderness.  Stiffness.  Inability to raise the arm above shoulder level or behind the body.  Weakness.  How is this diagnosed? This condition may be diagnosed based on:  Your symptoms.  Your medical history.  A physical exam.  Imaging tests, such as: ? X-rays. ? MRI. ? Ultrasound.  How is this treated? Treatment for this condition may include:  Resting your shoulder and avoiding all  activities that cause pain or put stress on the shoulder.  Icing your shoulder.  NSAIDs to help reduce pain and swelling.  One or more injections of medicines to numb the area and reduce inflammation.  Physical therapy.  Surgery. This may be needed if nonsurgical treatments have not helped. Surgery may involve repairing the rotator cuff, reshaping the acromion, or removing the bursa.  Follow these instructions at home: Managing pain, stiffness, and swelling  If directed, apply ice to the injured area. ? Put ice in a plastic bag. ? Place a towel between your skin and the bag. ? Leave the ice on for 20 minutes, 2-3 times a day. Activity  Rest and return to your normal activities as told by your health care provider. Ask your health care provider what activities are safe for you.  Do exercises as told by your health care provider. General instructions  Do not use any tobacco products, including cigarettes, chewing tobacco, or e-cigarettes. Tobacco can delay healing. If you need help quitting, ask your health care provider.  Ask your health care provider when it is safe for you to drive.  Take over-the-counter and prescription medicines only as told by your health care provider.  Keep all follow-up visits as told by your health care provider. This is important. How is this prevented?  Give your body time to rest between periods of activity.  Be safe and responsible while being active to avoid falls.  Maintain physical fitness, including strength and flexibility. Contact a health care provider if:  Your symptoms have not improved after 1-2 months of treatment and   rest.  You cannot lift your arm away from your body. This information is not intended to replace advice given to you by your health care provider. Make sure you discuss any questions you have with your health care provider. Document Released: 08/29/2005 Document Revised: 05/05/2016 Document Reviewed:  08/01/2015 Elsevier Interactive Patient Education  2018 [employer]  

## 2017-08-17 LAB — HEPATITIS C ANTIBODY (REFLEX): HCV Ab: <0.1 s/co ratio (ref 0.0–0.9)

## 2017-08-17 LAB — HCV COMMENT:

## 2017-08-17 LAB — HIV ANTIBODY (ROUTINE TESTING W REFLEX): HIV SCREEN 4TH GENERATION: NONREACTIVE

## 2017-08-21 ENCOUNTER — Ambulatory Visit: Payer: Self-pay | Admitting: Physical Medicine & Rehabilitation

## 2017-08-21 ENCOUNTER — Encounter: Payer: Self-pay | Attending: Physical Medicine & Rehabilitation

## 2017-08-23 ENCOUNTER — Ambulatory Visit: Payer: Self-pay | Admitting: Physical Therapy

## 2017-08-23 ENCOUNTER — Telehealth: Payer: Self-pay | Admitting: *Deleted

## 2017-08-23 MED FILL — ?CLONIDINE HCL 0.1 MG TABL: 0.1 | 30 days supply | Qty: 60 | Fill #3

## 2017-08-23 MED FILL — LOVASTATIN 20 MG TABLET: 20 | 30 days supply | Qty: 30 | Fill #4

## 2017-08-23 MED FILL — AMLODIPINE BESYLATE 10 MG T: 10 | 30 days supply | Qty: 30 | Fill #4

## 2017-08-23 MED FILL — LOSARTAN POTASSIUM 100 MG T: 100 | 30 days supply | Qty: 30 | Fill #4

## 2017-08-23 MED FILL — METOPROLOL SUCC ER 50 MG TA: 50 | 30 days supply | Qty: 30 | Fill #4

## 2017-08-23 NOTE — Telephone Encounter (Signed)
-----   Message from Loletta Specteroger David Gomez, PA-C sent at 08/17/2017  1:44 PM EST ----- HIV and HCV negative. Please notify patient.

## 2017-08-23 NOTE — Telephone Encounter (Signed)
MA unable to leave a message due to ringing and disconnecting. !!!Please inform patient of HIV/HCV being negative!!!

## 2017-08-24 MED FILL — !VIAGRA 100MG TABLET: 100 | 30 days supply | Qty: 10 | Fill #0

## 2017-08-30 ENCOUNTER — Telehealth (INDEPENDENT_AMBULATORY_CARE_PROVIDER_SITE_OTHER): Payer: Self-pay

## 2017-08-30 LAB — FECAL OCCULT BLOOD, IMMUNOCHEMICAL: FECAL OCCULT BLD: NEGATIVE

## 2017-08-30 NOTE — Telephone Encounter (Signed)
-----   Message from Loletta Specteroger David Gomez, PA-C sent at 08/30/2017 12:53 PM EST ----- FIT negative. Repeat in one year.

## 2017-08-30 NOTE — Telephone Encounter (Signed)
Patient is aware of negative FIT, and repeat testing in one year. [name], CMA

## 2017-09-07 ENCOUNTER — Emergency Department (HOSPITAL_COMMUNITY): Payer: Self-pay

## 2017-09-07 ENCOUNTER — Ambulatory Visit: Payer: Self-pay | Admitting: Physical Therapy

## 2017-09-07 ENCOUNTER — Other Ambulatory Visit: Payer: Self-pay

## 2017-09-07 ENCOUNTER — Encounter (HOSPITAL_COMMUNITY): Payer: Self-pay | Admitting: *Deleted

## 2017-09-07 ENCOUNTER — Emergency Department (HOSPITAL_COMMUNITY)
Admission: EM | Admit: 2017-09-07 | Discharge: 2017-09-07 | Disposition: A | Discharge status: Home / Self Care | Payer: Self-pay | Attending: Emergency Medicine | Admitting: Emergency Medicine

## 2017-09-07 DIAGNOSIS — M7989 Other specified soft tissue disorders: Secondary | ICD-10-CM

## 2017-09-07 DIAGNOSIS — F17210 Nicotine dependence, cigarettes, uncomplicated: Secondary | ICD-10-CM | POA: Insufficient documentation

## 2017-09-07 DIAGNOSIS — I10 Essential (primary) hypertension: Secondary | ICD-10-CM | POA: Insufficient documentation

## 2017-09-07 DIAGNOSIS — B353 Tinea pedis: Secondary | ICD-10-CM | POA: Insufficient documentation

## 2017-09-07 DIAGNOSIS — E785 Hyperlipidemia, unspecified: Secondary | ICD-10-CM | POA: Insufficient documentation

## 2017-09-07 DIAGNOSIS — R21 Rash and other nonspecific skin eruption: Secondary | ICD-10-CM | POA: Insufficient documentation

## 2017-09-07 DIAGNOSIS — Z79899 Other long term (current) drug therapy: Secondary | ICD-10-CM | POA: Insufficient documentation

## 2017-09-07 MED ORDER — TOLNAFTATE 1 % EX POWD: 1.0000 "application " | Freq: Two times a day (BID) | CUTANEOUS | 0 refills | Status: DC

## 2017-09-07 MED FILL — HYDROCHLOROTHIAZIDE 25 MG T: 25 | 30 days supply | Qty: 30 | Fill #4

## 2017-09-07 NOTE — ED Triage Notes (Signed)
Pt states increasing to R foot for several weeks.  Pain increases to dorsum with palpation.  Denies sob or cardiac hx.

## 2017-09-07 NOTE — ED Notes (Signed)
Patient transported to X-ray 

## 2017-09-07 NOTE — Discharge Instructions (Signed)
Your x-ray of your left foot today is normal. On exam you do not have any signs of infection. We are not sure why you have the small area of swelling. Elevate the foot as often as possible and follow up with your doctor. If your symptoms worsen, return here.

## 2017-09-07 NOTE — ED Provider Notes (Signed)
[facility] Provider Note   CSN: [id number] Arrival date & time: 09/07/17  1315     History   Chief Complaint Chief Complaint  Patient presents with  . Foot Swelling    HPI Thomas Medina is a 61 y.o. male who presents to the ED with swelling to the dorsum of the left foot. Patient reports that every night when he takes his socks off he notes some swelling of the left foot but it usually resolves in a little while. Last night he noted the area and today it continued to be there. Patient also c/o fungus of the feet that bothers him. Patient denies fever, chills, shortness of breath or chest pain.   HPI  Past Medical History:  Diagnosis Date  . Hypertension     Patient Active Problem List   Diagnosis Date Noted  . Hyperlipidemia 03/29/2017  . Hypertension 03/27/2017    History reviewed. No pertinent surgical history.     Home Medications    Prior to Admission medications   Medication Sig Start Date End Date Taking? Authorizing Provider  amLODipine (NORVASC) 10 MG tablet Take 1 tablet (10 mg total) by mouth daily. 03/27/17   Loletta SpecterGomez, Roger David, PA-C  cloNIDine (CATAPRES) 0.1 MG tablet Take 1 tablet (0.1 mg total) by mouth 2 (two) times daily. 05/16/17   Loletta SpecterGomez, Roger David, PA-C  cyclobenzaprine (FLEXERIL) 10 MG tablet Take 1 tablet (10 mg total) by mouth at bedtime. 06/01/17   Loletta SpecterGomez, Roger David, PA-C  HYDROcodone-acetaminophen (NORCO/VICODIN) 5-325 MG tablet Take 1 tablet by mouth every 12 (twelve) hours as needed for moderate pain. 08/16/17   Loletta SpecterGomez, Roger David, PA-C  losartan (COZAAR) 100 MG tablet Take 1 tablet (100 mg total) by mouth daily. 03/27/17   Loletta SpecterGomez, Roger David, PA-C  lovastatin (MEVACOR) 20 MG tablet Take 1 tablet (20 mg total) by mouth at bedtime. 03/29/17   Loletta SpecterGomez, Roger David, PA-C  metoprolol succinate (TOPROL-XL) 50 MG 24 hr tablet Take 1 tablet (50 mg total) by mouth daily. Take with or immediately following a meal.  03/27/17   Loletta SpecterGomez, Roger David, PA-C  naproxen (NAPROSYN) 500 MG tablet Take 1 tablet (500 mg total) by mouth 2 (two) times daily with a meal. 06/01/17   Loletta SpecterGomez, Roger David, PA-C  sildenafil (VIAGRA) 100 MG tablet Take 1 tablet (100 mg total) by mouth daily as needed for erectile dysfunction. 07/04/17   Quentin AngstJegede, Olugbemiga E, MD  tolnaftate (TINACTIN) 1 % powder Apply 1 application topically 2 (two) times daily. 09/07/17   Janne NapoleonNeese, Hope M, NP    Family History No family history on file.  Social History Social History   Tobacco Use  . Smoking status: Current Every Day Smoker    Packs/day: 0.15    Types: Cigarettes  . Smokeless tobacco: Never Used  Substance Use Topics  . Alcohol use: Yes    Comment: occ  . Drug use: No     Allergies   Patient has no known allergies.   Review of Systems Review of Systems  Musculoskeletal: Positive for arthralgias.       Swelling left foot.  Skin: Positive for rash.       Fungus of feet.  All other systems reviewed and are negative.    Physical Exam Updated Vital Signs BP 122/70 (BP Location: Right Arm)   Pulse 64   Temp 98.2 F (36.8 C) (Oral)   Resp 16   Ht 5\' 10"  (1.778 m)   Wt 116.6  kg (257 lb)   SpO2 99%   BMI 36.88 kg/m   Physical Exam  Constitutional: He is oriented to person, place, and time. No distress.  obese  HENT:  Head: Normocephalic and atraumatic.  Eyes: EOM are normal.  Neck: Neck supple.  Cardiovascular: Normal rate, regular rhythm and intact distal pulses.  Pulmonary/Chest: Effort normal and breath sounds normal.  Abdominal:  Abdominal hernia.  Musculoskeletal:       Left foot: There is swelling. There is normal capillary refill, no deformity and no laceration. Tenderness: mild.       Feet:  Pedal pulses 2+, adequate circulation, good touch sensation. There is a 2 cm area of swelling without increased warmth, no erythema or red streaking or other signs of infection. The patient does have yeast infection  bilateral feet between toes.   Neurological: He is alert and oriented to person, place, and time. No cranial nerve deficit.  Skin: Skin is warm and dry.  Psychiatric: He has a normal mood and affect.  Nursing note and vitals reviewed.    ED Treatments / Results  Labs (all labs ordered are listed, but only abnormal results are displayed) Labs Reviewed - No data to display   Radiology Dg Foot Complete Left  Result Date: 09/07/2017 CLINICAL DATA:  Or weeks of left foot pain and swelling predominantly dorsally. EXAM: LEFT FOOT - COMPLETE 3+ VIEW COMPARISON:  None in PACs FINDINGS: The bones of the left foot are subjectively adequately mineralized. The interphalangeal, MTP, and TMT joint spaces are well maintained. The intertarsal joint spaces appear normal. There is a plantar calcaneal spur. There is mild soft tissue swelling over the midfoot and forefoot. IMPRESSION: No bony abnormality is observed to explain the soft tissue swelling of the foot. No soft tissue gas collections or foreign bodies are observed within the foot. There are metallic wirelike structures present over the lower leg likely reflecting surgical clips or suture. Electronically Signed   By: David  [place]azil[place] M.D.   On: 09/07/2017 14:53    Procedures Procedures (including critical care time)  Medications Ordered in ED Medications - No data to display   Initial Impression / Assessment and Plan / ED Course  I have reviewed the triage vital signs and the nursing notes. 61 y.o. male with left foot swelling dorsum stable for d/c without fever, erythema, red streaking, shortness of breath or chest pain. Discussed with patient elevation of the foot and f/u with PCP. Return precautions discussed. Rx for tinea.   Final Clinical Impressions(s) / ED Diagnoses   Final diagnoses:  Swelling of left foot  Tinea pedis of both feet    ED Discharge Orders        Ordered    tolnaftate (TINACTIN) 1 % powder  2 times daily      09/07/17 1555       Damian Leavelleese, [place] M, [place]NP 09/07/17 1603    Mancel BaleWentz, Elliott, MD 09/07/17 [phone]

## 2017-09-08 ENCOUNTER — Ambulatory Visit (INDEPENDENT_AMBULATORY_CARE_PROVIDER_SITE_OTHER): Payer: Self-pay | Admitting: Physician Assistant

## 2017-09-10 ENCOUNTER — Emergency Department (HOSPITAL_COMMUNITY)
Admission: EM | Admit: 2017-09-10 | Discharge: 2017-09-10 | Disposition: A | Discharge status: Home / Self Care | Payer: Self-pay | Attending: Physician Assistant | Admitting: Physician Assistant

## 2017-09-10 ENCOUNTER — Emergency Department (HOSPITAL_COMMUNITY): Payer: Self-pay

## 2017-09-10 ENCOUNTER — Other Ambulatory Visit: Payer: Self-pay

## 2017-09-10 ENCOUNTER — Encounter (HOSPITAL_COMMUNITY): Payer: Self-pay

## 2017-09-10 DIAGNOSIS — F17210 Nicotine dependence, cigarettes, uncomplicated: Secondary | ICD-10-CM | POA: Insufficient documentation

## 2017-09-10 DIAGNOSIS — I10 Essential (primary) hypertension: Secondary | ICD-10-CM | POA: Insufficient documentation

## 2017-09-10 DIAGNOSIS — M25522 Pain in left elbow: Secondary | ICD-10-CM

## 2017-09-10 DIAGNOSIS — Z79899 Other long term (current) drug therapy: Secondary | ICD-10-CM | POA: Insufficient documentation

## 2017-09-10 DIAGNOSIS — M1990 Unspecified osteoarthritis, unspecified site: Secondary | ICD-10-CM | POA: Insufficient documentation

## 2017-09-10 DIAGNOSIS — M25422 Effusion, left elbow: Secondary | ICD-10-CM

## 2017-09-10 LAB — SYNOVIAL CELL COUNT + DIFF, W/ CRYSTALS
CRYSTALS FLUID: NONE SEEN
LYMPHOCYTES-SYNOVIAL FLD: 16 % (ref 0–20)
Neutrophil, Synovial: 84 % — ABNORMAL HIGH (ref 0–25)
WBC, Synovial: 20500 /mm3 — ABNORMAL HIGH (ref 0–200)

## 2017-09-10 LAB — GRAM STAIN

## 2017-09-10 MED ORDER — LIDOCAINE HCL (PF) 1 % IJ SOLN
5.0000 mL | Freq: Once | INTRAMUSCULAR | Status: AC
  Administered 2017-09-10: 5 mL via INTRADERMAL
  Filled 2017-09-10: qty 5

## 2017-09-10 MED ORDER — OXYCODONE-ACETAMINOPHEN 5-325 MG PO TABS: 1.0000 | ORAL_TABLET | ORAL | 0 refills | Status: DC | PRN

## 2017-09-10 MED ORDER — IBUPROFEN 800 MG PO TABS
800.0000 mg | ORAL_TABLET | Freq: Once | ORAL | Status: AC
  Administered 2017-09-10: 800 mg via ORAL
  Filled 2017-09-10: qty 1

## 2017-09-10 MED ORDER — IBUPROFEN 600 MG PO TABS: 600.0000 mg | ORAL_TABLET | Freq: Four times a day (QID) | ORAL | 0 refills | Status: DC | PRN

## 2017-09-10 MED ORDER — OXYCODONE-ACETAMINOPHEN 5-325 MG PO TABS
1.0000 | ORAL_TABLET | Freq: Once | ORAL | Status: AC
  Administered 2017-09-10: 1 via ORAL
  Filled 2017-09-10: qty 1

## 2017-09-10 NOTE — Discharge Instructions (Signed)
The analysis of your joint fluid suggests inflammatory arthritis, no evidence of septic arthritis or infection.  Please continue to take ibuprofen regularly, Percocet for breakthrough pain.  Please call tomorrow morning to schedule follow-up appointment with Dr. Greig RightMurphy's orthopedic office.  If you have worsening swelling or pain, redness, or warmth of the elbow joint, or develop fevers or chills please return to the emergency department immediately for reevaluation.

## 2017-09-10 NOTE — ED Notes (Signed)
Dr. Corlis LeakMackuen at bedside performing arthrocentesis at this time

## 2017-09-10 NOTE — ED Triage Notes (Signed)
Pt reports left foot  and hand swelling and pain x several weeks. Pt reports being seen on 12/27 and dc home with no antibiotics and he is concerned. Denies fevers/chills.

## 2017-09-10 NOTE — ED Notes (Signed)
Patient ambulating in hall.  States his daughter is here and he needs to leave.  States MD told him he could.  Spoke to PA who assumed care, and states not the case.  Patient allowed to walk to lobby to talk to his daughter, and will be back to room.  This RN explained the rationale.  Patient verbalized understanding.

## 2017-09-10 NOTE — ED Provider Notes (Signed)
[facility]   CSN: [id number] Arrival date & time: 09/10/17  1459     History   Chief Complaint No chief complaint on file.   HPI Judene Companionnthony Tirado is a 61 y.o. male.  HPI   Patient is a 61 year old male presenting with swelling to the left upper extremity.  Patient reports that there is swelling.  He is unable to really tell you where is that it hurts.  However on exam it appears to be in the elbow area.  He reports that he recently had swelling to the left lower extremity and pain.  The pain resolved but he still has mild swelling.  Patient has no known injury to either area.  Patient has noted no fevers.  And has no history of gout.  Past Medical History:  Diagnosis Date  . Hypertension     Patient Active Problem List   Diagnosis Date Noted  . Hyperlipidemia 03/29/2017  . Hypertension 03/27/2017    History reviewed. No pertinent surgical history.     Home Medications    Prior to Admission medications   Medication Sig Start Date End Date Taking? Authorizing Provider  amLODipine (NORVASC) 10 MG tablet Take 1 tablet (10 mg total) by mouth daily. 03/27/17   Loletta SpecterGomez, Roger David, PA-C  cloNIDine (CATAPRES) 0.1 MG tablet Take 1 tablet (0.1 mg total) by mouth 2 (two) times daily. 05/16/17   Loletta SpecterGomez, Roger David, PA-C  cyclobenzaprine (FLEXERIL) 10 MG tablet Take 1 tablet (10 mg total) by mouth at bedtime. 06/01/17   Loletta SpecterGomez, Roger David, PA-C  HYDROcodone-acetaminophen (NORCO/VICODIN) 5-325 MG tablet Take 1 tablet by mouth every 12 (twelve) hours as needed for moderate pain. 08/16/17   Loletta SpecterGomez, Roger David, PA-C  losartan (COZAAR) 100 MG tablet Take 1 tablet (100 mg total) by mouth daily. 03/27/17   Loletta SpecterGomez, Roger David, PA-C  lovastatin (MEVACOR) 20 MG tablet Take 1 tablet (20 mg total) by mouth at bedtime. 03/29/17   Loletta SpecterGomez, Roger David, PA-C  metoprolol succinate (TOPROL-XL) 50 MG 24 hr tablet Take 1 tablet (50 mg total) by mouth daily.  Take with or immediately following a meal. 03/27/17   Loletta SpecterGomez, Roger David, PA-C  naproxen (NAPROSYN) 500 MG tablet Take 1 tablet (500 mg total) by mouth 2 (two) times daily with a meal. 06/01/17   Loletta SpecterGomez, Roger David, PA-C  sildenafil (VIAGRA) 100 MG tablet Take 1 tablet (100 mg total) by mouth daily as needed for erectile dysfunction. 07/04/17   Quentin AngstJegede, Olugbemiga E, MD  tolnaftate (TINACTIN) 1 % powder Apply 1 application topically 2 (two) times daily. 09/07/17   Janne NapoleonNeese, Hope M, NP    Family History No family history on file.  Social History Social History   Tobacco Use  . Smoking status: Current Every Day Smoker    Packs/day: 0.15    Types: Cigarettes  . Smokeless tobacco: Never Used  Substance Use Topics  . Alcohol use: Yes    Comment: occ  . Drug use: No     Allergies   Patient has no known allergies.   Review of Systems Review of Systems  Constitutional: Negative for activity change.  Respiratory: Negative for shortness of breath.   Cardiovascular: Negative for chest pain.  Gastrointestinal: Negative for abdominal pain.  Musculoskeletal: Positive for joint swelling.  All other systems reviewed and are negative.    Physical Exam Updated Vital Signs BP (!) 153/91 (BP Location: Right Arm)   Pulse 79   Temp 98.2 F (  36.8 C) (Oral)   Resp (!) 22   SpO2 93%   Physical Exam  Constitutional: He is oriented to person, place, and time. He appears well-nourished.  HENT:  Head: Normocephalic.  Eyes: Conjunctivae are normal.  Cardiovascular: Normal rate and regular rhythm.  Pulmonary/Chest: Effort normal and breath sounds normal.  Musculoskeletal:  Left leg with small amount of swelling diffusely, similar to right.  Left arm is not noticeably more swollen than right arm.  There may be a tiny bit of puffiness.  The left elbow does have tenderness on palpation.  Neurological: He is oriented to person, place, and time.  Skin: Skin is warm and dry. He is not diaphoretic.    Psychiatric: He has a normal mood and affect. His behavior is normal.     ED Treatments / Results  Labs (all labs ordered are listed, but only abnormal results are displayed) Labs Reviewed - No data to display  EKG  EKG Interpretation None       Radiology No results found.  Procedures Procedures (including critical care time)  ARTHOCENTESIS Performed by: Arlana Hoveourteney L Srinika Delone Consent: Verbal consent obtained. Risks and benefits: risks, benefits and alternatives were discussed Consent given by: patient Required items: required blood products, implants, devices, and special equipment available Patient identity confirmed: verbally with patient Time out: Immediately prior to procedure a "time out" was called to verify the correct patient, procedure, equipment, support staff and site/side marked as required. Indications: swollen joint Joint: L elbow Local anesthesia used: 1 percent withy  Preparation: Patient was prepped and draped in the usual sterile fashion. Aspirate appearance: yellow, clear Aspirate amount: 7 ml Patient tolerance: Patient tolerated the procedure well with no immediate complications.     Medications Ordered in ED Medications - No data to display   Initial Impression / Assessment and Plan / ED Course  I have reviewed the triage vital signs and the nursing notes.  Pertinent labs & imaging results that were available during my care of the patient were reviewed by me and considered in my medical decision making (see chart for details).     Patient with strength consultation of symptoms.  Initially thought since he had swelling in multiple joints would consider autoimmune disorders, or gonococcal arthritis.  But there really is no warmth to the joints.  X-ray shows large effusion.  I am mildly concerned because he had the effusion to the left ankle and then now the left elbow.  He denies recent sexual activity however migratory monoarthritis makes me  concerned for gonococcus.  Will do arthrocentesis.  Did successfully.  Will sign out. Gout vs pseudo gout vs infectino,.  Results pending we will otherwise treat with pain control.    Final Clinical Impressions(s) / ED Diagnoses   Final diagnoses:  None    ED Discharge Orders    None       Jeramyah Goodpasture Lyn, MD 09/13/17 0000

## 2017-09-10 NOTE — ED Provider Notes (Signed)
Care assumed from Dr. Corlis LeakMackuen at shift change, please see her note for full details but in brief Thomas Medina is a 61 y.o. male presents complaining of swelling of the left elbow.  X-ray shows moderate joint effusion.  Dr. Corlis LeakMackuen performed arthrocentesis.  Will follow up on results of joint fluid analysis and dispo appropriately.   Results for orders placed or performed during the hospital encounter of 09/10/17  Gram stain  Result Value Ref Range   Specimen Description FLUID SYNOVIAL    Special Requests LEFT ELBOW    Gram Stain      ABUNDANT WBC PRESENT, PREDOMINANTLY PMN NO ORGANISMS SEEN    Report Status 09/10/2017 FINAL   Culture, body fluid-bottle  Result Value Ref Range   Specimen Description FLUID SYNOVIAL    Special Requests      LEFT ELBOW BOTTLES DRAWN AEROBIC ONLY Blood Culture adequate volume   Culture PENDING    Report Status PENDING   Cell count + diff,  w/ cryst-synvl fld  Result Value Ref Range   Color, Synovial STRAW (A) YELLOW   Appearance-Synovial CLOUDY (A) CLEAR   Crystals, Fluid NO CRYSTALS SEEN    WBC, Synovial 20,500 (H) 0 - 200 /cu mm   Neutrophil, Synovial 84 (H) 0 - 25 %   Lymphocytes-Synovial Fld 16 0 - 20 %    Fluid analysis significant for WBC count of 20,500, no evidence of crystals.  Gram stain shows many white blood cells, but no obvious organisms seen.  This is suggestive of inflammatory arthritis, but without crystals to suggest gout or pseudogout, white count is not high enough to suggest septic arthritis.  Discussed these results with the patient, recommend continued NSAID therapy, will also provide a few Percocet for breakthrough pain.  Patient is to follow-up with orthopedics, referral provided for Dr. Eulah PontMurphy.  Strict return precautions discussed with patient, particularly signs of septic arthritis, patient is aware that this is a risk of arthrocentesis procedure. Pt expresses understanding and agrees with plan.  In no acute distress at  discharge.  Vitals:   09/10/17 1945 09/10/17 2312  BP: (!) 162/82 (!) 153/79  Pulse:  80  Resp:  18  Temp:    SpO2:  96%      Dartha LodgeFord, Kelsey N, PA-C 09/11/17 0139    Eber HongMiller, Brian, MD 09/11/17 [phone]

## 2017-09-10 NOTE — ED Notes (Signed)
Patient able to ambulate independently  

## 2017-09-11 NOTE — Congregational Nurse Program (Signed)
Congregational Nurse Program Note  Date of Encounter: 09/08/2017  Past Medical History: Past Medical History:  Diagnosis Date  . Hypertension     Encounter Details: CNP Questionnaire - 09/08/17 1223      Questionnaire   Patient Status  Not Applicable    Race  Black or African American    Location Patient Served At  Not Applicable    Insurance  Not Applicable    Uninsured  Uninsured (NEW 1x/quarter)    Food  Yes, have food insecurities;Within past 12 months, worried food would run out with no money to buy more;Within past 12 months, food ran out with no money to buy more    Housing/Utilities  No permanent housing    Transportation  Yes, need transportation assistance    Interpersonal Safety  No, do not feel physically and emotionally safe where you currently live    Medication  Yes, have medication insecurities    Medical Provider  Yes    Referrals  Primary Care Provider/Clinic      Client requesting assistance with Mental Health evaluation.  Encouraged client to discuss his MH symptoms with his provider for integrated care.

## 2017-09-14 ENCOUNTER — Ambulatory Visit: Payer: Self-pay | Admitting: Physical Therapy

## 2017-09-15 LAB — CULTURE, BODY FLUID W GRAM STAIN -BOTTLE

## 2017-09-15 LAB — CULTURE, BODY FLUID-BOTTLE: CULTURE: NO GROWTH

## 2017-09-20 ENCOUNTER — Ambulatory Visit (INDEPENDENT_AMBULATORY_CARE_PROVIDER_SITE_OTHER): Payer: Self-pay | Admitting: Physician Assistant

## 2017-09-20 ENCOUNTER — Other Ambulatory Visit: Payer: Self-pay

## 2017-09-20 ENCOUNTER — Encounter (INDEPENDENT_AMBULATORY_CARE_PROVIDER_SITE_OTHER): Payer: Self-pay | Admitting: Physician Assistant

## 2017-09-20 VITALS — BP 148/73 | HR 80 | Temp 97.9°F | Wt 276.4 lb

## 2017-09-20 DIAGNOSIS — M25522 Pain in left elbow: Secondary | ICD-10-CM

## 2017-09-20 DIAGNOSIS — M1990 Unspecified osteoarthritis, unspecified site: Secondary | ICD-10-CM

## 2017-09-20 DIAGNOSIS — R600 Localized edema: Secondary | ICD-10-CM

## 2017-09-20 DIAGNOSIS — I10 Essential (primary) hypertension: Secondary | ICD-10-CM

## 2017-09-20 MED ORDER — FUROSEMIDE 40 MG PO TABS: 40.0000 mg | ORAL_TABLET | Freq: Every day | ORAL | 0 refills | Status: DC

## 2017-09-20 MED ORDER — HYDROCODONE-ACETAMINOPHEN 5-325 MG PO TABS: 1.0000 | ORAL_TABLET | Freq: Four times a day (QID) | ORAL | 0 refills | Status: DC | PRN

## 2017-09-20 MED ORDER — IBUPROFEN 600 MG PO TABS: 600.0000 mg | ORAL_TABLET | Freq: Four times a day (QID) | ORAL | 0 refills | Status: DC | PRN

## 2017-09-20 NOTE — Progress Notes (Signed)
Subjective:  Patient ID: Thomas Companionnthony Uffelman, male    DOB: 1956-01-04  Age: 62 y.o. MRN: [id number]  CC: elbow pain, edema, insomnia  HPI  Thomas Medina is a 62 y.o. male with a medical history of hypertension presents with left elbow pain. Went to ED 10 days ago and diagnosed with inflammatory arthritis and pain and swelling in left elbow. XR showed moderate joint effusion. Had arthrocentesis performed which revealed WBC count of 20,500, no evidence of crystals. Gram stain negative for bacteria. Was advised to go to orthopedics. Prescribed Percocet. Pain and swelling in elbow mildly to moderately better but pain is keeping him up. Patient says he has cut down on salt content in food and noticed improvement in his elbow pain/swelling. Does not endorse any other complaint or symptom.    Outpatient Medications Prior to Visit  Medication Sig Dispense Refill  . amLODipine (NORVASC) 10 MG tablet Take 1 tablet (10 mg total) by mouth daily. 90 tablet 3  . cloNIDine (CATAPRES) 0.1 MG tablet Take 1 tablet (0.1 mg total) by mouth 2 (two) times daily. 60 tablet 5  . cyclobenzaprine (FLEXERIL) 10 MG tablet Take 1 tablet (10 mg total) by mouth at bedtime. 30 tablet 0  . ibuprofen (ADVIL,MOTRIN) 600 MG tablet Take 1 tablet (600 mg total) by mouth every 6 (six) hours as needed. 30 tablet 0  . losartan (COZAAR) 100 MG tablet Take 1 tablet (100 mg total) by mouth daily. 90 tablet 3  . lovastatin (MEVACOR) 20 MG tablet Take 1 tablet (20 mg total) by mouth at bedtime. 90 tablet 3  . metoprolol succinate (TOPROL-XL) 50 MG 24 hr tablet Take 1 tablet (50 mg total) by mouth daily. Take with or immediately following a meal. 90 tablet 3  . naproxen (NAPROSYN) 500 MG tablet Take 1 tablet (500 mg total) by mouth 2 (two) times daily with a meal. 30 tablet 0  . oxyCODONE-acetaminophen (PERCOCET) 5-325 MG tablet Take 1 tablet by mouth every 4 (four) hours as needed. 10 tablet 0  . sildenafil (VIAGRA) 100 MG tablet Take 1  tablet (100 mg total) by mouth daily as needed for erectile dysfunction. 30 tablet 3  . tolnaftate (TINACTIN) 1 % powder Apply 1 application topically 2 (two) times daily. 45 g 0  . HYDROcodone-acetaminophen (NORCO/VICODIN) 5-325 MG tablet Take 1 tablet by mouth every 12 (twelve) hours as needed for moderate pain. (Patient not taking: Reported on 09/20/2017) 40 tablet 0   No facility-administered medications prior to visit.      ROS Review of Systems  Constitutional: Negative for chills, fever and malaise/fatigue.  Eyes: Negative for blurred vision.  Respiratory: Negative for shortness of breath.   Cardiovascular: Negative for chest pain and palpitations.  Gastrointestinal: Negative for abdominal pain and nausea.  Genitourinary: Negative for dysuria and hematuria.  Musculoskeletal: Positive for joint pain. Negative for myalgias.  Skin: Negative for rash.  Neurological: Negative for tingling and headaches.  Psychiatric/Behavioral: Negative for depression. The patient is not nervous/anxious.     Objective:  BP (!) 148/73 (BP Location: Left Arm, Patient Position: Sitting, Cuff Size: Normal)   Pulse 80   Temp 97.9 F (36.6 C) (Oral)   Wt 276 lb 6.4 oz (125.4 kg)   SpO2 91%   BMI 39.66 kg/m   BP/Weight 09/20/2017 09/10/2017 09/07/2017  Systolic BP 148 153 122  Diastolic BP 73 79 70  Wt. (Lbs) 276.4 - 257  BMI 39.66 - 36.88      Physical Exam  Constitutional: He is oriented to person, place, and time.  Well developed, obese, NAD, polite  HENT:  Head: Normocephalic and atraumatic.  Eyes: No scleral icterus.  Neck: Normal range of motion. Neck supple. No thyromegaly present.  Cardiovascular: Normal rate, regular rhythm and normal heart sounds.  Pulmonary/Chest: Effort normal and breath sounds normal.  Musculoskeletal: He exhibits no edema.  Left elbow mildly tender to palpation. Pain worse over the left epicondyle. No increased warmth or swelling.  Neurological: He is alert  and oriented to person, place, and time. No cranial nerve deficit. Coordination normal.  Skin: Skin is warm and dry. No rash noted. No erythema. No pallor.  Psychiatric: He has a normal mood and affect. His behavior is normal. Thought content normal.  Vitals reviewed.    Assessment & Plan:   1. Left elbow pain - HYDROcodone-acetaminophen (NORCO) 5-325 MG tablet; Take 1 tablet by mouth every 6 (six) hours as needed for moderate pain.  Dispense: 30 tablet; Refill: 0 - ibuprofen (ADVIL,MOTRIN) 600 MG tablet; Take 1 tablet (600 mg total) by mouth every 6 (six) hours as needed.  Dispense: 30 tablet; Refill: 0 - Comprehensive metabolic panel - CBC with Differential - ANA w/Reflex  2. Inflammatory arthritis - Expect improvement with rest, ice, compression, and NSAID. - HYDROcodone-acetaminophen (NORCO) 5-325 MG tablet; Take 1 tablet by mouth every 6 (six) hours as needed for moderate pain.  Dispense: 30 tablet; Refill: 0 - ibuprofen (ADVIL,MOTRIN) 600 MG tablet; Take 1 tablet (600 mg total) by mouth every 6 (six) hours as needed.  Dispense: 30 tablet; Refill: 0 - Comprehensive metabolic panel - CBC with Differential - ANA w/Reflex  3. Hypertension, unspecified type - Begin furosemide (LASIX) 40 MG tablet; Take 1 tablet (40 mg total) by mouth daily.  Dispense: 30 tablet; Refill: 0 - Continue all other anti-hypertensives  4. Lower extremity edema - Begin furosemide (LASIX) 40 MG tablet; Take 1 tablet (40 mg total) by mouth daily.  Dispense: 30 tablet; Refill: 0   Meds ordered this encounter  Medications  . HYDROcodone-acetaminophen (NORCO) 5-325 MG tablet    Sig: Take 1 tablet by mouth every 6 (six) hours as needed for moderate pain.    Dispense:  30 tablet    Refill:  0    Order Specific Question:   Supervising Provider    Answer:   Quentin Angst [id number]  . ibuprofen (ADVIL,MOTRIN) 600 MG tablet    Sig: Take 1 tablet (600 mg total) by mouth every 6 (six) hours as needed.     Dispense:  30 tablet    Refill:  0    Order Specific Question:   Supervising Provider    Answer:   Quentin Angst [id number]  . furosemide (LASIX) 40 MG tablet    Sig: Take 1 tablet (40 mg total) by mouth daily.    Dispense:  30 tablet    Refill:  0    Order Specific Question:   Supervising Provider    Answer:   Quentin Angst [id number]    Follow-up: Return if symptoms worsen or fail to improve.   Loletta Specter PA

## 2017-09-20 NOTE — Patient Instructions (Signed)
Acetaminophen; Hydrocodone tablets or capsules What is this medicine? ACETAMINOPHEN; HYDROCODONE (a set a MEE noe fen; hye droe KOE done) is a pain reliever. It is used to treat moderate to severe pain. This medicine may be used for other purposes; ask your health care provider or pharmacist if you have questions. COMMON BRAND NAME(S): Anexsia, Bancap HC, Ceta-Plus, Co-Gesic, Comfortpak, Dolagesic, Dolorex Forte, DuoCet, Hydrocet, Hydrogesic, Lorcet, Lorcet HD, Lorcet Plus, Lortab, Margesic H, Maxidone, Norco, Polygesic, Stagesic, Vanacet, Verdrocet, Vicodin, Vicodin ES, Vicodin HP, Xodol, Zydone What should I tell my health care provider before I take this medicine? They need to know if you have any of these conditions: -brain tumor -Crohn's disease, inflammatory bowel disease, or ulcerative colitis -drug abuse or addiction -head injury -heart or circulation problems -if you often drink alcohol -kidney disease or problems going to the bathroom -liver disease -lung disease, asthma, or breathing problems -an unusual or allergic reaction to acetaminophen, hydrocodone, other opioid analgesics, other medicines, foods, dyes, or preservatives -pregnant or trying to get pregnant -breast-feeding How should I use this medicine? Take this medicine by mouth with a glass of water. Follow the directions on the prescription label. You can take it with or without food. If it upsets your stomach, take it with food. Do not take your medicine more often than directed. A special MedGuide will be given to you by the pharmacist with each prescription and refill. Be sure to read this information carefully each time. Talk to your pediatrician regarding the use of this medicine in children. Special care may be needed. Overdosage: If you think you have taken too much of this medicine contact a poison control center or emergency room at once. NOTE: This medicine is only for you. Do not share this medicine with  others. What if I miss a dose? If you miss a dose, take it as soon as you can. If it is almost time for your next dose, take only that dose. Do not take double or extra doses. What may interact with this medicine? This medicine may interact with the following medications: -alcohol -antiviral medicines for HIV or AIDS -atropine -antihistamines for allergy, cough and cold -certain antibiotics like erythromycin, clarithromycin -certain medicines for anxiety or sleep -certain medicines for bladder problems like oxybutynin, tolterodine -certain medicines for depression like amitriptyline, fluoxetine, sertraline -certain medicines for fungal infections like ketoconazole and itraconazole -certain medicines for Parkinson's disease like benztropine, trihexyphenidyl -certain medicines for seizures like carbamazepine, phenobarbital, phenytoin, primidone -certain medicines for stomach problems like dicyclomine, hyoscyamine -certain medicines for travel sickness like scopolamine -general anesthetics like halothane, isoflurane, methoxyflurane, propofol -ipratropium -local anesthetics like lidocaine, pramoxine, tetracaine -MAOIs like Carbex, Eldepryl, Marplan, Nardil, and Parnate -medicines that relax muscles for surgery -other medicines with acetaminophen -other narcotic medicines for pain or cough -phenothiazines like chlorpromazine, mesoridazine, prochlorperazine, thioridazine -rifampin This list may not describe all possible interactions. Give your health care provider a list of all the medicines, herbs, non-prescription drugs, or dietary supplements you use. Also tell them if you smoke, drink alcohol, or use illegal drugs. Some items may interact with your medicine. What should I watch for while using this medicine? Tell your doctor or health care professional if your pain does not go away, if it gets worse, or if you have new or a different type of pain. You may develop tolerance to the medicine.  Tolerance means that you will need a higher dose of the medicine for pain relief. Tolerance is normal and is expected if   you take the medicine for a long time. Do not suddenly stop taking your medicine because you may develop a severe reaction. Your body becomes used to the medicine. This does NOT mean you are addicted. Addiction is a behavior related to getting and using a drug for a non-medical reason. If you have pain, you have a medical reason to take pain medicine. Your doctor will tell you how much medicine to take. If your doctor wants you to stop the medicine, the dose will be slowly lowered over time to avoid any side effects. There are different types of narcotic medicines (opiates). If you take more than one type at the same time or if you are taking another medicine that also causes drowsiness, you may have more side effects. Give your health care provider a list of all medicines you use. Your doctor will tell you how much medicine to take. Do not take more medicine than directed. Call emergency for help if you have problems breathing or unusual sleepiness. Do not take other medicines that contain acetaminophen with this medicine. Always read labels carefully. If you have questions, ask your doctor or pharmacist. If you take too much acetaminophen get medical help right away. Too much acetaminophen can be very dangerous and cause liver damage. Even if you do not have symptoms, it is important to get help right away. You may get drowsy or dizzy. Do not drive, use machinery, or do anything that needs mental alertness until you know how this medicine affects you. Do not stand or sit up quickly, especially if you are an older patient. This reduces the risk of dizzy or fainting spells. Alcohol may interfere with the effect of this medicine. Avoid alcoholic drinks. The medicine will cause constipation. Try to have a bowel movement at least every 2 to 3 days. If you do not have a bowel movement for 3  days, call your doctor or health care professional. Your mouth may get dry. Chewing sugarless gum or sucking hard candy, and drinking plenty of water may help. Contact your doctor if the problem does not go away or is severe. What side effects may I notice from receiving this medicine? Side effects that you should report to your doctor or health care professional as soon as possible: -allergic reactions like skin rash, itching or hives, swelling of the face, lips, or tongue -breathing problems -confusion -redness, blistering, peeling or loosening of the skin, including inside the mouth -signs and symptoms of low blood pressure like dizziness; feeling faint or lightheaded, falls; unusually weak or tired -trouble passing urine or change in the amount of urine -yellowing of the eyes or skin Side effects that usually do not require medical attention (report to your doctor or health care professional if they continue or are bothersome): -constipation -dry mouth -nausea, vomiting -tiredness This list may not describe all possible side effects. Call your doctor for medical advice about side effects. You may report side effects to FDA at 1-800-FDA-1088. Where should I keep my medicine? Keep out of the reach of children. This medicine can be abused. Keep your medicine in a safe place to protect it from theft. Do not share this medicine with anyone. Selling or giving away this medicine is dangerous and against the law. This medicine may cause accidental overdose and death if it taken by other adults, children, or pets. Mix any unused medicine with a substance like cat litter or coffee grounds. Then throw the medicine away in a sealed container like   a sealed bag or a coffee can with a lid. Do not use the medicine after the expiration date. Store at room temperature between 15 and 30 degrees C (59 and 86 degrees F). NOTE: This sheet is a summary. It may not cover all possible information. If you have  questions about this medicine, talk to your doctor, pharmacist, or health care provider.  2018 Elsevier/Gold Standard (2015-05-22 10:02:16)  

## 2017-09-21 LAB — CBC WITH DIFFERENTIAL/PLATELET
BASOS ABS: 0 10*3/uL (ref 0.0–0.2)
BASOS: 0 %
EOS (ABSOLUTE): 0.3 10*3/uL (ref 0.0–0.4)
Eos: 3 %
Hematocrit: 39.3 % (ref 37.5–51.0)
Hemoglobin: 12.8 g/dL — ABNORMAL LOW (ref 13.0–17.7)
IMMATURE GRANS (ABS): 0 10*3/uL (ref 0.0–0.1)
IMMATURE GRANULOCYTES: 0 %
LYMPHS: 33 %
Lymphocytes Absolute: 3 10*3/uL (ref 0.7–3.1)
MCH: 29 pg (ref 26.6–33.0)
MCHC: 32.6 g/dL (ref 31.5–35.7)
MCV: 89 fL (ref 79–97)
Monocytes Absolute: 0.7 10*3/uL (ref 0.1–0.9)
Monocytes: 7 %
NEUTROS PCT: 57 %
Neutrophils Absolute: 5.2 10*3/uL (ref 1.4–7.0)
PLATELETS: 342 10*3/uL (ref 150–379)
RBC: 4.41 x10E6/uL (ref 4.14–5.80)
RDW: 15.5 % — ABNORMAL HIGH (ref 12.3–15.4)
WBC: 9.2 10*3/uL (ref 3.4–10.8)

## 2017-09-21 LAB — COMPREHENSIVE METABOLIC PANEL
A/G RATIO: 1.5 (ref 1.2–2.2)
ALK PHOS: 87 IU/L (ref 39–117)
ALT: 25 IU/L (ref 0–44)
AST: 17 IU/L (ref 0–40)
Albumin: 4.3 g/dL (ref 3.6–4.8)
BUN/Creatinine Ratio: 14 (ref 10–24)
BUN: 13 mg/dL (ref 8–27)
Bilirubin Total: 0.3 mg/dL (ref 0.0–1.2)
CALCIUM: 9.6 mg/dL (ref 8.6–10.2)
CHLORIDE: 103 mmol/L (ref 96–106)
CO2: 28 mmol/L (ref 20–29)
Creatinine, Ser: 0.9 mg/dL (ref 0.76–1.27)
GFR calc Af Amer: 107 mL/min/{1.73_m2} (ref >59)
GFR, EST NON AFRICAN AMERICAN: 93 mL/min/{1.73_m2} (ref >59)
Globulin, Total: 2.9 g/dL (ref 1.5–4.5)
Glucose: 133 mg/dL — ABNORMAL HIGH (ref 65–99)
Potassium: 4.3 mmol/L (ref 3.5–5.2)
Sodium: 144 mmol/L (ref 134–144)
Total Protein: 7.2 g/dL (ref 6.0–8.5)

## 2017-09-21 LAB — ANA W/REFLEX: ANA: NEGATIVE

## 2017-09-21 MED FILL — FUROSEMIDE 40 MG TAB: 40 | 30 days supply | Qty: 30 | Fill #0

## 2017-09-21 MED FILL — IBUPROFEN 600 MG TABLET: 600 | 7 days supply | Qty: 30 | Fill #0

## 2017-09-22 MED FILL — METOPROLOL SUCCINATE ER 50: 50 | 30 days supply | Qty: 30 | Fill #5

## 2017-09-22 MED FILL — LOVASTATIN 20 MG TABLET: 20 | 30 days supply | Qty: 30 | Fill #5

## 2017-09-22 MED FILL — LOSARTAN POTASSIUM 100 MG T: 100 | 30 days supply | Qty: 30 | Fill #5

## 2017-09-22 MED FILL — ?CLONIDINE HCL 0.1 MG TABL: 0.1 | 30 days supply | Qty: 60 | Fill #4

## 2017-09-22 MED FILL — AMLODIPINE BESYLATE 10 MG T: 10 | 30 days supply | Qty: 30 | Fill #5

## 2017-09-25 ENCOUNTER — Telehealth (INDEPENDENT_AMBULATORY_CARE_PROVIDER_SITE_OTHER): Payer: Self-pay

## 2017-09-25 MED FILL — !VIAGRA 100MG TABLET: 100 | 30 days supply | Qty: 10 | Fill #1

## 2017-09-25 NOTE — Telephone Encounter (Signed)
Patient is aware that he is mildly anemic needs to get multivitamin with iron and all other labs stable. Thomas Medina, CMA

## 2017-09-25 NOTE — Telephone Encounter (Signed)
-----   Message from Loletta Specteroger David Gomez, PA-C sent at 09/25/2017 12:47 PM EST ----- Very mildly anemic. Take multivitamin with iron. Rest of labs stable.

## 2017-10-06 DIAGNOSIS — I10 Essential (primary) hypertension: Secondary | ICD-10-CM

## 2017-10-11 ENCOUNTER — Encounter (INDEPENDENT_AMBULATORY_CARE_PROVIDER_SITE_OTHER): Payer: Self-pay | Admitting: Physician Assistant

## 2017-10-11 ENCOUNTER — Ambulatory Visit (INDEPENDENT_AMBULATORY_CARE_PROVIDER_SITE_OTHER): Payer: Self-pay | Admitting: Physician Assistant

## 2017-10-11 VITALS — BP 120/69 | HR 78 | Temp 97.9°F | Resp 18 | Ht 66.0 in | Wt 268.0 lb

## 2017-10-11 DIAGNOSIS — R600 Localized edema: Secondary | ICD-10-CM

## 2017-10-11 DIAGNOSIS — I10 Essential (primary) hypertension: Secondary | ICD-10-CM

## 2017-10-11 DIAGNOSIS — G8929 Other chronic pain: Secondary | ICD-10-CM

## 2017-10-11 DIAGNOSIS — Z79899 Other long term (current) drug therapy: Secondary | ICD-10-CM

## 2017-10-11 DIAGNOSIS — M25511 Pain in right shoulder: Secondary | ICD-10-CM

## 2017-10-11 DIAGNOSIS — M25512 Pain in left shoulder: Secondary | ICD-10-CM

## 2017-10-11 MED ORDER — HYDROCODONE-ACETAMINOPHEN 5-325 MG PO TABS: 1.0000 | ORAL_TABLET | Freq: Four times a day (QID) | ORAL | 0 refills | Status: DC | PRN

## 2017-10-11 MED ORDER — FUROSEMIDE 40 MG PO TABS: 40.0000 mg | ORAL_TABLET | Freq: Every day | ORAL | 3 refills | Status: DC

## 2017-10-11 MED ORDER — CLONIDINE HCL 0.1 MG PO TABS: 0.1000 mg | ORAL_TABLET | Freq: Two times a day (BID) | ORAL | 1 refills | Status: DC

## 2017-10-11 MED ORDER — HYDROCODONE-ACETAMINOPHEN 5-325 MG PO TABS: 1.0000 | ORAL_TABLET | Freq: Three times a day (TID) | ORAL | 0 refills | Status: DC | PRN

## 2017-10-11 NOTE — Patient Instructions (Signed)
Magnetic Resonance Imaging  Magnetic resonance imaging (MRI) is a painless test that takes pictures of the inside of your body. This test uses a strong magnet. This test does not use X-rays or radiation.  What happens before the procedure?   You will be asked to take off all metal. This includes:  ? Your watch, jewelry, and other metal items.  ? Some makeup may have very small bits of metal and may need to be taken off.  ? Braces and fillings normally are not a problem.  What happens during the procedure?   You may be given earplugs or headphones to listen to music. The machine can be noisy.   You might get a shot (injection) with a dye (contrast material) to help the MRI take better pictures.   MRI is done in a tunnel-shaped scanner. You will lie on a table that slides into the tunnel-shaped scanner. Once inside, you will still be able to talk to the person doing the test.   You will be asked to hold very still. You will be told when you can shift position. You may have to wait a few minutes to make sure the images are readable.  What happens after the procedure?   You may go back to your normal activities right away.   If you got a shot of dye, it will pass naturally through your body within a day.   Your doctor will talk to you about the results.  This information is not intended to replace advice given to you by your health care provider. Make sure you discuss any questions you have with your health care provider.  Document Released: 10/01/2010 Document Revised: 02/04/2016 Document Reviewed: 10/24/2013  Elsevier Interactive Patient Education  2018 Elsevier Inc.

## 2017-10-11 NOTE — Progress Notes (Signed)
Subjective:  Patient ID: Thomas Medina Companionnthony Medina, male    DOB: 04-Jan-1956  Age: 62 y.o. MRN: [id number]  CC: shoulder pain  HPI  Aleda Grananthony Joyneris a 62 y.o.malewith a medical history of hypertension presents on f/u of chronic bilateral shoulder pain. Last seen for this complaint nearly two months ago. He had been going to physical therapy which resulted in "30-40%" improvement. Has not been to PT since 08/10/17. Says he did not go back to physical therapy because he had the flu. MRI of right shoulder was ordered on 07/13/17 but patient has not gotten done yet. Pain of the shoulders continues and can only actively abduct his arms to approximately 45 degrees. Reports ADLs are affected in regards to dressing, toileting/hygiene, and eating.    Outpatient Medications Prior to Visit  Medication Sig Dispense Refill  . amLODipine (NORVASC) 10 MG tablet Take 1 tablet (10 mg total) by mouth daily. 90 tablet 3  . cloNIDine (CATAPRES) 0.1 MG tablet Take 1 tablet (0.1 mg total) by mouth 2 (two) times daily. 60 tablet 5  . furosemide (LASIX) 40 MG tablet Take 1 tablet (40 mg total) by mouth daily. 30 tablet 0  . HYDROcodone-acetaminophen (NORCO) 5-325 MG tablet Take 1 tablet by mouth every 6 (six) hours as needed for moderate pain. 30 tablet 0  . ibuprofen (ADVIL,MOTRIN) 600 MG tablet Take 1 tablet (600 mg total) by mouth every 6 (six) hours as needed. 30 tablet 0  . losartan (COZAAR) 100 MG tablet Take 1 tablet (100 mg total) by mouth daily. 90 tablet 3  . lovastatin (MEVACOR) 20 MG tablet Take 1 tablet (20 mg total) by mouth at bedtime. 90 tablet 3  . metoprolol succinate (TOPROL-XL) 50 MG 24 hr tablet Take 1 tablet (50 mg total) by mouth daily. Take with or immediately following a meal. 90 tablet 3  . sildenafil (VIAGRA) 100 MG tablet Take 1 tablet (100 mg total) by mouth daily as needed for erectile dysfunction. 30 tablet 3  . tolnaftate (TINACTIN) 1 % powder Apply 1 application topically 2 (two) times daily.  45 g 0   No facility-administered medications prior to visit.      ROS Review of Systems  Constitutional: Negative for chills, fever and malaise/fatigue.  Eyes: Negative for blurred vision.  Respiratory: Negative for shortness of breath.   Cardiovascular: Negative for chest pain and palpitations.  Gastrointestinal: Negative for abdominal pain and nausea.  Genitourinary: Negative for dysuria and hematuria.  Musculoskeletal: Positive for joint pain. Negative for myalgias.  Skin: Negative for rash.  Neurological: Negative for tingling and headaches.  Psychiatric/Behavioral: Negative for depression. The patient is not nervous/anxious.     Objective:   Vitals:   10/11/17 1353  BP: 120/69  Pulse: 78  Resp: 18  Temp: 97.9 F (36.6 C)  SpO2: 95%      Physical Exam  Constitutional: He is oriented to person, place, and time.  Well developed, obese, NAD, polite  HENT:  Head: Normocephalic and atraumatic.  Eyes: No scleral icterus.  Neck: Normal range of motion. Neck supple.  Cardiovascular: Normal rate, regular rhythm and normal heart sounds.  Pulmonary/Chest: Effort normal and breath sounds normal.  Musculoskeletal: He exhibits no edema.  Severely limited abduction and flexion of the shoulders  Neurological: He is alert and oriented to person, place, and time. No cranial nerve deficit. Coordination normal.  Skin: Skin is warm and dry. No rash noted. No erythema. No pallor.  Psychiatric: He has a normal mood and affect.  His behavior is normal. Thought content normal.  Vitals reviewed.    Assessment & Plan:   1. Chronic pain of both shoulders - MR Shoulder Left Wo Contrast; Future - MRI shoulder right wo contrast has already been ordered.  - HYDROcodone-acetaminophen (NORCO) 5-325 MG tablet; Take 1 tablet by mouth every 8 hours as needed for moderate pain.  Dispense: 90 tablet; Refill: 0 - Advised patient to call PT and resume care for his shoulder pain.  - Pt unable to  afford pain clinic fees.  2. Hypertension, unspecified type - Refill furosemide (LASIX) 40 MG tablet; Take 1 tablet (40 mg total) by mouth daily.  Dispense: 90 tablet; Refill: 3 - Refill cloNIDine (CATAPRES) 0.1 MG tablet; Take 1 tablet (0.1 mg total) by mouth 2 (two) times daily.  Dispense: 180 tablet; Refill: 1 - Continue on metoprolol, losartan, and amlodipine. - Basic Metabolic Panel  3. Lower extremity edema - Resolving - furosemide (LASIX) 40 MG tablet; Take 1 tablet (40 mg total) by mouth daily.  Dispense: 90 tablet; Refill: 3  4. High risk medication use - Basic Metabolic Panel   Meds ordered this encounter  Medications  . HYDROcodone-acetaminophen (NORCO) 5-325 MG tablet    Sig: Take 1 tablet by mouth every 6 (six) hours as needed for moderate pain.    Dispense:  30 tablet    Refill:  0    Order Specific Question:   Supervising Provider    Answer:   Quentin Angst [id number]  . furosemide (LASIX) 40 MG tablet    Sig: Take 1 tablet (40 mg total) by mouth daily.    Dispense:  90 tablet    Refill:  3    Order Specific Question:   Supervising Provider    Answer:   Quentin Angst [id number]  . cloNIDine (CATAPRES) 0.1 MG tablet    Sig: Take 1 tablet (0.1 mg total) by mouth 2 (two) times daily.    Dispense:  180 tablet    Refill:  1    Order Specific Question:   Supervising Provider    Answer:   Quentin Angst [id number]    Follow-up: 6 weeks shoulder pain.  Loletta Specter PA

## 2017-10-12 ENCOUNTER — Telehealth (INDEPENDENT_AMBULATORY_CARE_PROVIDER_SITE_OTHER): Payer: Self-pay | Admitting: *Deleted

## 2017-10-12 LAB — BASIC METABOLIC PANEL
BUN/Creatinine Ratio: 23 (ref 10–24)
BUN: 17 mg/dL (ref 8–27)
CO2: 25 mmol/L (ref 20–29)
CREATININE: 0.74 mg/dL — AB (ref 0.76–1.27)
Calcium: 9.5 mg/dL (ref 8.6–10.2)
Chloride: 97 mmol/L (ref 96–106)
GFR, EST AFRICAN AMERICAN: 116 mL/min/{1.73_m2} (ref >59)
GFR, EST NON AFRICAN AMERICAN: 100 mL/min/{1.73_m2} (ref >59)
Glucose: 113 mg/dL — ABNORMAL HIGH (ref 65–99)
Potassium: 4.4 mmol/L (ref 3.5–5.2)
SODIUM: 141 mmol/L (ref 134–144)

## 2017-10-12 NOTE — Telephone Encounter (Signed)
-----   Message from Loletta Specteroger David Gomez, PA-C sent at 10/12/2017  8:40 AM EST ----- Normal kidney function. Continue on all meds as directed.

## 2017-10-12 NOTE — Telephone Encounter (Signed)
Patient verified DOB Patient is aware of kidney function being normal and to continue with current medications. No further questions.

## 2017-10-17 MED FILL — ?FUROSEMIDE 40 MG TABLET: 40 | 30 days supply | Qty: 30 | Fill #0

## 2017-10-19 ENCOUNTER — Ambulatory Visit (HOSPITAL_COMMUNITY)
Admission: RE | Admit: 2017-10-19 | Discharge: 2017-10-19 | Disposition: A | Discharge status: Home / Self Care | Payer: Self-pay | Source: Ambulatory Visit | Attending: Physician Assistant | Admitting: Physician Assistant

## 2017-10-19 DIAGNOSIS — G8929 Other chronic pain: Secondary | ICD-10-CM

## 2017-10-19 DIAGNOSIS — M25511 Pain in right shoulder: Secondary | ICD-10-CM

## 2017-10-19 DIAGNOSIS — M7551 Bursitis of right shoulder: Secondary | ICD-10-CM | POA: Insufficient documentation

## 2017-10-19 DIAGNOSIS — M65811 Other synovitis and tenosynovitis, right shoulder: Secondary | ICD-10-CM | POA: Insufficient documentation

## 2017-10-19 DIAGNOSIS — Z1389 Encounter for screening for other disorder: Secondary | ICD-10-CM

## 2017-10-19 DIAGNOSIS — M7552 Bursitis of left shoulder: Secondary | ICD-10-CM | POA: Insufficient documentation

## 2017-10-19 DIAGNOSIS — M19012 Primary osteoarthritis, left shoulder: Secondary | ICD-10-CM | POA: Insufficient documentation

## 2017-10-19 DIAGNOSIS — M25512 Pain in left shoulder: Principal | ICD-10-CM

## 2017-10-19 DIAGNOSIS — Z0189 Encounter for other specified special examinations: Secondary | ICD-10-CM

## 2017-10-19 DIAGNOSIS — M19011 Primary osteoarthritis, right shoulder: Secondary | ICD-10-CM | POA: Insufficient documentation

## 2017-10-19 DIAGNOSIS — M65812 Other synovitis and tenosynovitis, left shoulder: Secondary | ICD-10-CM | POA: Insufficient documentation

## 2017-10-20 ENCOUNTER — Other Ambulatory Visit (INDEPENDENT_AMBULATORY_CARE_PROVIDER_SITE_OTHER): Payer: Self-pay | Admitting: Physician Assistant

## 2017-10-20 ENCOUNTER — Telehealth (INDEPENDENT_AMBULATORY_CARE_PROVIDER_SITE_OTHER): Payer: Self-pay | Admitting: *Deleted

## 2017-10-20 DIAGNOSIS — G8929 Other chronic pain: Secondary | ICD-10-CM

## 2017-10-20 DIAGNOSIS — M25511 Pain in right shoulder: Principal | ICD-10-CM

## 2017-10-20 NOTE — Telephone Encounter (Signed)
Medical Assistant left message on patient's home and cell voicemail. Voicemail states to give [place] [facility] [place] [facility] [phone]. Patient is aware of tendinopathy, bursitis, and AC osteoarthritis being noted in the shoulder. Patient has been referred to ortho for further evaluation and treatment.

## 2017-10-20 NOTE — Telephone Encounter (Signed)
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 I have now placed another orthopedic referral.

## 2017-10-24 MED FILL — !VIAGRA 100MG TABLET: 100 | 30 days supply | Qty: 10 | Fill #2

## 2017-10-24 MED FILL — HYDROCHLOROTHIAZIDE 25 MG T: 25 | 30 days supply | Qty: 30 | Fill #5

## 2017-10-24 MED FILL — ?AMLODIPINE BESYLATE 10MG T: 10 | 30 days supply | Qty: 30 | Fill #6

## 2017-10-24 MED FILL — ?CLONIDINE HCL 0.1 MG TABL: 0.1 | 30 days supply | Qty: 60 | Fill #5

## 2017-10-30 ENCOUNTER — Other Ambulatory Visit (INDEPENDENT_AMBULATORY_CARE_PROVIDER_SITE_OTHER): Payer: Self-pay | Admitting: Physician Assistant

## 2017-11-03 ENCOUNTER — Ambulatory Visit: Payer: Self-pay | Attending: Internal Medicine

## 2017-11-08 ENCOUNTER — Encounter (INDEPENDENT_AMBULATORY_CARE_PROVIDER_SITE_OTHER): Payer: Self-pay | Admitting: Orthopedic Surgery

## 2017-11-08 ENCOUNTER — Other Ambulatory Visit (INDEPENDENT_AMBULATORY_CARE_PROVIDER_SITE_OTHER): Payer: Self-pay | Admitting: Physician Assistant

## 2017-11-08 ENCOUNTER — Ambulatory Visit (INDEPENDENT_AMBULATORY_CARE_PROVIDER_SITE_OTHER): Payer: Self-pay | Admitting: Orthopedic Surgery

## 2017-11-08 DIAGNOSIS — M7501 Adhesive capsulitis of right shoulder: Secondary | ICD-10-CM

## 2017-11-08 MED ORDER — BUPIVACAINE HCL 0.5 % IJ SOLN
9.0000 mL | INTRAMUSCULAR | Status: AC | PRN
  Administered 2017-11-08: 9 mL via INTRA_ARTICULAR

## 2017-11-08 MED ORDER — LIDOCAINE HCL 1 % IJ SOLN
5.0000 mL | INTRAMUSCULAR | Status: AC | PRN
  Administered 2017-11-08: 5 mL

## 2017-11-08 MED ORDER — METHYLPREDNISOLONE ACETATE 40 MG/ML IJ SUSP
40.0000 mg | INTRAMUSCULAR | Status: AC | PRN
  Administered 2017-11-08: 40 mg via INTRA_ARTICULAR

## 2017-11-08 NOTE — Telephone Encounter (Signed)
Patient requesting a refill of Ibuprofen  600 mg  He use [facility] . Please, call him when is ready to pick up .

## 2017-11-08 NOTE — Progress Notes (Signed)
Office Visit Note   Patient: Thomas Medina Companionnthony Medina           Date of Birth: 1956-03-22           MRN: [id number] Visit Date: 11/08/2017 Requested by: Loletta SpecterGomez, Roger David, PA-C [address] [place], [place] [id number] PCP: Denny LevyGomez, Roger David, PA-C  Subjective: Chief Complaint  Patient presents with  . Shoulder Pain    bilateral-right worse than left    HPI: Thomas Brownsnthony is a patient with bilateral shoulder pain right worse than left.  Been going on 7 months.  Denies any history of injury.  Hurts for him to get out of a chair.  He has been using ibuprofen and hydrocodone.  He does not have diabetes.  He is not currently working but he used to be a [employer] architecttruck driver.  MRI scans are reviewed with the patient.  They do show a mild to moderate AC joint arthritis but no rotator cuff tear and no significant arthritis in the glenohumeral joint.  There is a little bit of thickening of the inferior capsule bilaterally.  This is a non-arthrogram study.  Patient does describe loss of range of motion.  Review of clinic notes demonstrates prior recognition of loss of passive motion in the right shoulder..              ROS: All systems reviewed are negative as they relate to the chief complaint within the history of present illness.  Patient denies  fevers or chills.   Assessment & Plan: Visit Diagnoses: No diagnosis found.  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  Rotator cuff strength is good on exam and on MRI scanning.  Plan today is for intra-articular injection.  This is done in the right shoulder joint.  If this helps him achieve pain relief in range of motion gains then we will continue with an injection in the left shoulder in 1-2 weeks.  I also adjusted his physical therapy to focus more on stretching and less on strengthening.  Recheck him back in 8 weeks for clinical recheck and possible repeat injection into both shoulders.  We discussed with him the natural history of frozen  shoulder which is typically for resolution over a 2-year time frame from  onset of symptoms.    Follow-Up Instructions: No Follow-up on file.   Orders:  No orders of the defined types were placed in this encounter.  No orders of the defined types were placed in this encounter.     Procedures: Large Joint Inj: R glenohumeral on 11/08/2017 4:07 PM Indications: diagnostic evaluation and pain Details: 18 G 1.5 in needle, posterior approach  Arthrogram: No  Medications: 9 mL bupivacaine 0.5 %; 40 mg methylPREDNISolone acetate 40 MG/ML; 5 mL lidocaine 1 % Outcome: tolerated well, no immediate complications Procedure, treatment alternatives, risks and benefits explained, specific risks discussed. Consent was given by the patient. Immediately prior to procedure a time out was called to verify the correct patient, procedure, equipment, support staff and site/side marked as required. Patient was prepped and draped in the usual sterile fashion.       Clinical Data: No additional findings.  Objective: Vital Signs: There were no vitals taken for this visit.  Physical Exam:   Constitutional: Patient appears well-developed HEENT:  Head: Normocephalic Eyes:EOM are normal Neck: Normal range of motion Cardiovascular: Normal rate Pulmonary/chest: Effort normal Neurologic: Patient is alert Skin: Skin is warm Psychiatric: Patient has normal mood and affect  Ortho Exam: Orthopedic examination demonstrates good cervical spine range of motion.  5 out of 5 grip EPL FPL interosseous resection wrist extension biceps triceps and deltoid strength with palpable radial pulses bilaterally.  Patient does have forward flexion passively to about 130 on the right and 150 on the left.  Isolated glenohumeral abduction on the right is about 75 on the left 95.  External rotation to 15 degrees of abduction is about 45 on the left compared to 30 on the right ... rotator cuff strength is excellent to  infraspinatus supraspinatus and subscap muscle testing.  No masses lymphadenopathy or skin changes noted in the shoulder girdle region  Specialty Comments:  No specialty comments available.  Imaging: No results found.   PMFS History: Patient Active Problem List   Diagnosis Date Noted  . Hyperlipidemia 03/29/2017  . Hypertension 03/27/2017   Past Medical History:  Diagnosis Date  . Hypertension     History reviewed. No pertinent family history.  History reviewed. No pertinent surgical history. Social History   Occupational History  . Not on file  Tobacco Use  . Smoking status: Current Every Day Smoker    Packs/day: 0.15    Types: Cigarettes  . Smokeless tobacco: Never Used  Substance and Sexual Activity  . Alcohol use: Yes    Comment: occ  . Drug use: No  . Sexual activity: Not on file

## 2017-11-08 NOTE — Telephone Encounter (Signed)
Patient requested for a refill on ibuprofen 600mg  tab. [facility]

## 2017-11-13 MED ORDER — IBUPROFEN 600 MG PO TABS: 600.0000 mg | ORAL_TABLET | Freq: Three times a day (TID) | ORAL | 0 refills | Status: DC | PRN

## 2017-11-13 NOTE — Telephone Encounter (Signed)
FWD to PCP. Johnluke Haugen S Tarissa Kerin, CMA  

## 2017-11-14 ENCOUNTER — Ambulatory Visit: Payer: Self-pay | Admitting: Physical Therapy

## 2017-11-14 ENCOUNTER — Encounter: Payer: Self-pay | Admitting: Physical Therapy

## 2017-11-14 MED FILL — IBUPROFEN 600 MG TABLET: 600 | 10 days supply | Qty: 30 | Fill #0

## 2017-11-20 MED FILL — HYDROCHLOROTHIAZIDE 25 MG T: 25 | 30 days supply | Qty: 30 | Fill #6

## 2017-11-20 MED FILL — LOVASTATIN 20 MG TABS: 20 | 30 days supply | Qty: 30 | Fill #6

## 2017-11-20 MED FILL — LOSARTAN POTASSIUM 100 MG T: 100 | 30 days supply | Qty: 30 | Fill #6

## 2017-11-20 MED FILL — !VIAGRA 100MG TABLET: 100 | 30 days supply | Qty: 10 | Fill #3

## 2017-11-20 MED FILL — FUROSEMIDE 40 MG TAB: 40 | 30 days supply | Qty: 30 | Fill #1

## 2017-11-22 ENCOUNTER — Ambulatory Visit (INDEPENDENT_AMBULATORY_CARE_PROVIDER_SITE_OTHER): Payer: Self-pay | Admitting: Physician Assistant

## 2017-11-22 ENCOUNTER — Encounter (INDEPENDENT_AMBULATORY_CARE_PROVIDER_SITE_OTHER): Payer: Self-pay | Admitting: Physician Assistant

## 2017-11-22 ENCOUNTER — Other Ambulatory Visit: Payer: Self-pay

## 2017-11-22 DIAGNOSIS — M25511 Pain in right shoulder: Secondary | ICD-10-CM

## 2017-11-22 DIAGNOSIS — M25512 Pain in left shoulder: Secondary | ICD-10-CM

## 2017-11-22 DIAGNOSIS — G8929 Other chronic pain: Secondary | ICD-10-CM

## 2017-11-22 MED ORDER — HYDROCODONE-ACETAMINOPHEN 5-325 MG PO TABS: 1.0000 | ORAL_TABLET | Freq: Two times a day (BID) | ORAL | 0 refills | Status: DC

## 2017-11-22 NOTE — Progress Notes (Signed)
Subjective:  Patient ID: Thomas Medina, male    DOB: [birth year]  Age: 62 y.o. MRN: [id number]  CC: shoulder pain, back pain  HPI Thomas Medina Joyneris a 62 y.o.malewith a medical history of hypertension and chronic bilateral shoulder pain presents with bilateral shoulder pain. Recent visit to orthopedic specialist has diagnosed adhesive capsulitis and expects to start physical therapy tomorrow. Expected recovery for adhesive capsulitis is two years according to Dr. August Saucerean orthopedic specialist. Pt says he needs something now for pain. Had previously taken Hydrocodone for pain which he reports using sparingly. No other symptoms or complaints to report.     Outpatient Medications Prior to Visit  Medication Sig Dispense Refill  . amLODipine (NORVASC) 10 MG tablet Take 1 tablet (10 mg total) by mouth daily. 90 tablet 3  . cloNIDine (CATAPRES) 0.1 MG tablet Take 1 tablet (0.1 mg total) by mouth 2 (two) times daily. 180 tablet 1  . furosemide (LASIX) 40 MG tablet Take 1 tablet (40 mg total) by mouth daily. 90 tablet 3  . ibuprofen (ADVIL,MOTRIN) 600 MG tablet Take 1 tablet (600 mg total) by mouth every 8 (eight) hours as needed. 30 tablet 0  . losartan (COZAAR) 100 MG tablet Take 1 tablet (100 mg total) by mouth daily. 90 tablet 3  . lovastatin (MEVACOR) 20 MG tablet Take 1 tablet (20 mg total) by mouth at bedtime. 90 tablet 3  . metoprolol succinate (TOPROL-XL) 50 MG 24 hr tablet Take 1 tablet (50 mg total) by mouth daily. Take with or immediately following a meal. 90 tablet 3  . sildenafil (VIAGRA) 100 MG tablet Take 1 tablet (100 mg total) by mouth daily as needed for erectile dysfunction. 30 tablet 3  . tolnaftate (TINACTIN) 1 % powder Apply 1 application topically 2 (two) times daily. 45 g 0  . HYDROcodone-acetaminophen (NORCO) 5-325 MG tablet Take 1 tablet by mouth every 8 (eight) hours as needed for moderate pain. 90 tablet 0   No facility-administered medications prior to visit.       ROS Review of Systems  Constitutional: Negative for chills, fever and malaise/fatigue.  Eyes: Negative for blurred vision.  Respiratory: Negative for shortness of breath.   Cardiovascular: Negative for chest pain and palpitations.  Gastrointestinal: Negative for abdominal pain and nausea.  Genitourinary: Negative for dysuria and hematuria.  Musculoskeletal: Positive for joint pain. Negative for myalgias.  Skin: Negative for rash.  Neurological: Negative for tingling and headaches.  Psychiatric/Behavioral: Negative for depression. The patient is not nervous/anxious.     Objective:  BP (!) 151/78 (BP Location: Left Arm, Patient Position: Sitting, Cuff Size: Large)   Pulse 98   Temp 98.3 F (36.8 C) (Oral)   Wt 267 lb 12.8 oz (121.5 kg)   SpO2 95%   BMI 43.22 kg/m   BP/Weight 11/22/2017 10/11/2017 09/20/2017  Systolic BP 151 120 148  Diastolic BP 78 69 73  Wt. (Lbs) 267.8 268 276.4  BMI 43.22 43.26 39.66      Physical Exam  Constitutional:  Obese, NAD, polite  Musculoskeletal:  Right shoulder aROM approximately 80-90 degrees of abduction. Right shoulder pROM approximately 130 degrees of abduction.      Assessment & Plan:   1. Chronic pain of both shoulders - Ambulatory referral to Pain Clinic - HYDROcodone-acetaminophen (NORCO) 5-325 MG tablet; Take 1 tablet by mouth every 12 (twelve) hours.  Dispense: 60 tablet; Refill: 0 - Keep PT appt for tomorrow.  Meds ordered this encounter  Medications  . HYDROcodone-acetaminophen (NORCO) 5-325  MG tablet    Sig: Take 1 tablet by mouth every 12 (twelve) hours.    Dispense:  60 tablet    Refill:  0    Order Specific Question:   Supervising Provider    Answer:   Quentin Angst [id number]    Follow-up: Return if symptoms worsen or fail to improve.   Loletta Specter PA

## 2017-11-22 NOTE — Patient Instructions (Signed)
Adhesive Capsulitis Adhesive capsulitis is inflammation of the tendons and ligaments that surround the shoulder joint (shoulder capsule). This condition causes the shoulder to become stiff and painful to move. Adhesive capsulitis is also called frozen shoulder. What are the causes? This condition may be caused by:  An injury to the shoulder joint.  Straining the shoulder.  Not moving the shoulder for a period of time. This can happen if your arm was injured or in a sling.  Long-standing health problems, such as: ? Diabetes. ? Thyroid problems. ? Heart disease. ? Stroke. ? Rheumatoid arthritis. ? Lung disease.  In some cases, the cause may not be known. What increases the risk? This condition is more likely to develop in:  Women.  People who are older than 61 years of age.  What are the signs or symptoms? Symptoms of this condition include:  Pain in the shoulder when moving the arm. There may also be pain when parts of the shoulder are touched. The pain is worse at night or when at rest.  Soreness or aching in the shoulder.  Inability to move the shoulder normally.  Muscle spasms.  How is this diagnosed? This condition is diagnosed with a physical exam and imaging tests, such as an X-ray or MRI. How is this treated? This condition may be treated with:  Treatment of the underlying cause or condition.  Physical therapy. This involves performing exercises to get the shoulder moving again.  Medicine. Medicine may be given to relieve pain, inflammation, or muscle spasms.  Steroid injections into the shoulder joint.  Shoulder manipulation. This is a procedure to move the shoulder into another position. It is done after you are given a medicine to make you fall asleep (general anesthetic). The joint may also be injected with salt water at high pressure to break down scarring.  Surgery. This may be done in severe cases when other treatments have failed.  Although most  people recover completely from adhesive capsulitis, some may not regain the full movement of the shoulder. Follow these instructions at home:  Take over-the-counter and prescription medicines only as told by your health care provider.  If you are being treated with physical therapy, follow instructions from your physical therapist.  Avoid exercises that put a lot of demand on your shoulder, such as throwing. These exercises can make pain worse.  If directed, apply ice to the injured area: ? Put ice in a plastic bag. ? Place a towel between your skin and the bag. ? Leave the ice on for 20 minutes, 2-3 times per day. Contact a health care provider if:  You develop new symptoms.  Your symptoms get worse. This information is not intended to replace advice given to you by your health care provider. Make sure you discuss any questions you have with your health care provider. Document Released: 06/26/2009 Document Revised: 02/04/2016 Document Reviewed: 12/22/2014 Elsevier Interactive Patient Education  2018 Elsevier Inc.  

## 2017-11-22 NOTE — Progress Notes (Signed)
   Subjective:  Patient ID: Thomas Medina, male    DOB: 1956-09-11  Age: 62 y.o. MRN: [id number]  CC: shoulder pain, back pain  HPI Thomas Grananthony Joyneris a 62 y.o.malewith a medical history of hypertension and chronic bilateral shoulder pain presents with bilateral shoulder pain. Recent visit to orthopedic specialist has diagnosed adhesive capsulitis and expects to start physical therapy tomorrow. Expected recovery for adhesive capsulitis is two years according to Dr. August Saucerean orthopedic specialist.       Outpatient Medications Prior to Visit  Medication Sig Dispense Refill  . amLODipine (NORVASC) 10 MG tablet Take 1 tablet (10 mg total) by mouth daily. 90 tablet 3  . cloNIDine (CATAPRES) 0.1 MG tablet Take 1 tablet (0.1 mg total) by mouth 2 (two) times daily. 180 tablet 1  . furosemide (LASIX) 40 MG tablet Take 1 tablet (40 mg total) by mouth daily. 90 tablet 3  . HYDROcodone-acetaminophen (NORCO) 5-325 MG tablet Take 1 tablet by mouth every 8 (eight) hours as needed for moderate pain. 90 tablet 0  . ibuprofen (ADVIL,MOTRIN) 600 MG tablet Take 1 tablet (600 mg total) by mouth every 8 (eight) hours as needed. 30 tablet 0  . losartan (COZAAR) 100 MG tablet Take 1 tablet (100 mg total) by mouth daily. 90 tablet 3  . lovastatin (MEVACOR) 20 MG tablet Take 1 tablet (20 mg total) by mouth at bedtime. 90 tablet 3  . metoprolol succinate (TOPROL-XL) 50 MG 24 hr tablet Take 1 tablet (50 mg total) by mouth daily. Take with or immediately following a meal. 90 tablet 3  . sildenafil (VIAGRA) 100 MG tablet Take 1 tablet (100 mg total) by mouth daily as needed for erectile dysfunction. 30 tablet 3  . tolnaftate (TINACTIN) 1 % powder Apply 1 application topically 2 (two) times daily. 45 g 0   No facility-administered medications prior to visit.      ROS ROS  Objective:  BP (!) 151/78 (BP Location: Left Arm, Patient Position: Sitting, Cuff Size: Large)   Pulse 98   Temp 98.3 F (36.8 C) (Oral)   Wt  267 lb 12.8 oz (121.5 kg)   SpO2 95%   BMI 43.22 kg/m   BP/Weight 11/22/2017 10/11/2017 09/20/2017  Systolic BP 151 120 148  Diastolic BP 78 69 73  Wt. (Lbs) 267.8 268 276.4  BMI 43.22 43.26 39.66      Physical Exam   Assessment & Plan:   1. Chronic pain of both shoulders - Ambulatory referral to Pain Clinic - HYDROcodone-acetaminophen (NORCO) 5-325 MG tablet; Take 1 tablet by mouth every 12 (twelve) hours.  Dispense: 60 tablet; Refill: 0   Meds ordered this encounter  Medications  . HYDROcodone-acetaminophen (NORCO) 5-325 MG tablet    Sig: Take 1 tablet by mouth every 12 (twelve) hours.    Dispense:  60 tablet    Refill:  0    Order Specific Question:   Supervising Provider    Answer:   Thomas Medina, Thomas E [id number]    Follow-up: Return if symptoms worsen or fail to improve.   Thomas Specteroger David Kody Brandl PA

## 2017-11-23 ENCOUNTER — Ambulatory Visit: Payer: Self-pay | Admitting: Physical Therapy

## 2017-11-24 ENCOUNTER — Telehealth: Payer: Self-pay | Admitting: Physician Assistant

## 2017-11-24 NOTE — Telephone Encounter (Signed)
Faxed patient's application for Bipap to the [employer] [phone].

## 2017-12-01 ENCOUNTER — Encounter: Payer: Self-pay | Attending: Physical Medicine & Rehabilitation

## 2017-12-01 ENCOUNTER — Ambulatory Visit (HOSPITAL_BASED_OUTPATIENT_CLINIC_OR_DEPARTMENT_OTHER): Payer: Self-pay | Admitting: Physical Medicine & Rehabilitation

## 2017-12-01 ENCOUNTER — Encounter: Payer: Self-pay | Admitting: Physical Medicine & Rehabilitation

## 2017-12-01 VITALS — BP 120/77 | HR 82

## 2017-12-01 DIAGNOSIS — M7502 Adhesive capsulitis of left shoulder: Secondary | ICD-10-CM | POA: Insufficient documentation

## 2017-12-01 DIAGNOSIS — M7501 Adhesive capsulitis of right shoulder: Secondary | ICD-10-CM | POA: Insufficient documentation

## 2017-12-01 DIAGNOSIS — Z9889 Other specified postprocedural states: Secondary | ICD-10-CM | POA: Insufficient documentation

## 2017-12-01 DIAGNOSIS — F17210 Nicotine dependence, cigarettes, uncomplicated: Secondary | ICD-10-CM | POA: Insufficient documentation

## 2017-12-01 DIAGNOSIS — I10 Essential (primary) hypertension: Secondary | ICD-10-CM | POA: Insufficient documentation

## 2017-12-01 DIAGNOSIS — Z8051 Family history of malignant neoplasm of kidney: Secondary | ICD-10-CM | POA: Insufficient documentation

## 2017-12-01 DIAGNOSIS — Z8249 Family history of ischemic heart disease and other diseases of the circulatory system: Secondary | ICD-10-CM | POA: Insufficient documentation

## 2017-12-01 MED ORDER — IBUPROFEN 600 MG PO TABS: 600.0000 mg | ORAL_TABLET | Freq: Three times a day (TID) | ORAL | 1 refills | Status: DC

## 2017-12-01 NOTE — Patient Instructions (Addendum)
SUPRASCAPULAR NERVE BLOCK UNDER ULTRASOUND NEXT VISIT Adhesive Capsulitis Adhesive capsulitis is inflammation of the tendons and ligaments that surround the shoulder joint (shoulder capsule). This condition causes the shoulder to become stiff and painful to move. Adhesive capsulitis is also called frozen shoulder. What are the causes? This condition may be caused by:  An injury to the shoulder joint.  Straining the shoulder.  Not moving the shoulder for a period of time. This can happen if your arm was injured or in a sling.  Long-standing health problems, such as: ? Diabetes. ? Thyroid problems. ? Heart disease. ? Stroke. ? Rheumatoid arthritis. ? Lung disease.  In some cases, the cause may not be known. What increases the risk? This condition is more likely to develop in:  Women.  People who are older than 61 years of age.  What are the signs or symptoms? Symptoms of this condition include:  Pain in the shoulder when moving the arm. There may also be pain when parts of the shoulder are touched. The pain is worse at night or when at rest.  Soreness or aching in the shoulder.  Inability to move the shoulder normally.  Muscle spasms.  How is this diagnosed? This condition is diagnosed with a physical exam and imaging tests, such as an X-ray or MRI. How is this treated? This condition may be treated with:  Treatment of the underlying cause or condition.  Physical therapy. This involves performing exercises to get the shoulder moving again.  Medicine. Medicine may be given to relieve pain, inflammation, or muscle spasms.  Steroid injections into the shoulder joint.  Shoulder manipulation. This is a procedure to move the shoulder into another position. It is done after you are given a medicine to make you fall asleep (general anesthetic). The joint may also be injected with salt water at high pressure to break down scarring.  Surgery. This may be done in severe cases  when other treatments have failed.  Although most people recover completely from adhesive capsulitis, some may not regain the full movement of the shoulder. Follow these instructions at home:  Take over-the-counter and prescription medicines only as told by your health care provider.  If you are being treated with physical therapy, follow instructions from your physical therapist.  Avoid exercises that put a lot of demand on your shoulder, such as throwing. These exercises can make pain worse.  If directed, apply ice to the injured area: ? Put ice in a plastic bag. ? Place a towel between your skin and the bag. ? Leave the ice on for 20 minutes, 2-3 times per day. Contact a health care provider if:  You develop new symptoms.  Your symptoms get worse. This information is not intended to replace advice given to you by your health care provider. Make sure you discuss any questions you have with your health care provider. Document Released: 06/26/2009 Document Revised: 02/04/2016 Document Reviewed: 12/22/2014 Elsevier Interactive Patient Education  [employer]2018 [employer]vier Inc.

## 2017-12-01 NOTE — Progress Notes (Signed)
Subjective:    Patient ID: Thomas Medina, male    DOB: Jan 07, 1956, 62 y.o.   MRN: [id number]  HPI  CC:  R>L shoulder pain 62 yo male 9-12 mo hx of insidious onset shoulder pain and stifness.  Denies any recent trauma.  Had PT which was reportedly not helpful in increasing ROM or reducing pain Takes Ibuprofen BID rather than TID, denies stomach upset with this med  Seen by ortho, MRI of shoulder reviewed, no sig abnormalities  Took oxycodone from wife which worked better than hydrocodone, we discussed it is illegal to borrow narcotics from another person Hydrocodone caused stomach problems per pt  Takes Cymbalta 40mg  per day No neck pain , has some low back pain but this is not severe compared to shoulder pain No lower ext pain  Evaluated by Dr August Saucerean of Ortho, diagnosed with adhesive capsulitis and was given shoulder injection which helped for a day or two Pt denies hx of CVA, DM, Thyroid disease, shoulder injury or shoulder surgery in the past Pain Inventory Average Pain 10 Pain Right Now 9 My pain is sharp, tingling and aching  In the last 24 hours, has pain interfered with the following? General activity 3 Relation with others 7 Enjoyment of life 4 What TIME of day is your pain at its worst? evening Sleep (in general) Poor  Pain is worse with: walking, bending and standing Pain improves with: therapy/exercise and medication Relief from Meds: 8  Mobility walk without assistance ability to climb steps?  yes do you drive?  yes  Function Do you have any goals in this area?  no  Neuro/Psych numbness tingling trouble walking dizziness depression  Prior Studies Any changes since last visit?  no CLINICAL DATA:  Chronic bilateral shoulder pain.  No known injury.  EXAM: MRI OF THE RIGHT SHOULDER WITHOUT CONTRAST  TECHNIQUE: Multiplanar, multisequence MR imaging of the shoulder was performed. No intravenous contrast was administered.  COMPARISON:   None.  FINDINGS: Patient motion degrades the exam.  Rotator cuff: There is some thickening and heterogeneously increased T2 signal in the supraspinatus and infraspinatus tendons consistent with tendinopathy. No tear is identified.  Muscles:  No atrophy or focal lesion.  Biceps long head:  Intact.  Acromioclavicular Joint: Moderate to advanced degenerative disease is seen. Type 1 acromion. Small volume of fluid in the subacromial/subdeltoid bursa.  Glenohumeral Joint: Mild degenerative change is seen.  Labrum: Evaluation is limited due to motion but no tear is identified.  Bones:  No fracture or worrisome lesion.  Other: None.  IMPRESSION: Supraspinatus and infraspinatus tendinopathy without tear.  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  Small volume of subacromial/subdeltoid fluid compatible with bursitis.   Electronically Signed   By: Drusilla Kannerhomas  Dalessio M.D.   On: 10/20/2017 09:27  CLINICAL DATA:  Chronic bilateral shoulder pain.  No known injury.  EXAM: MRI OF THE LEFT SHOULDER WITHOUT CONTRAST  TECHNIQUE: Multiplanar, multisequence MR imaging of the shoulder was performed. No intravenous contrast was administered.  COMPARISON:  None.  FINDINGS: Rotator cuff: Thickening and heterogeneously increased T2 signal in the rotator cuff tendons are most notable in the supraspinatus and infraspinatus. No rotator cuff tear.  Muscles:  No atrophy or focal lesion.  Biceps long head:  Intact.  Acromioclavicular Joint: Moderate to advanced degenerative change is present. Type 1 acromion. Small volume of subacromial/subdeltoid fluid.  Glenohumeral Joint: Negative.  Labrum:  Intact.  Bones:  No fracture or worrisome lesion.  Other: None.  IMPRESSION: Rotator cuff tendinopathy  without tear appears most notable in the supraspinatus.  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  Small volume of  subacromial/subdeltoid fluid compatible with bursitis.   Electronically Signed   By: Drusilla Kanner M.D.   On: 10/20/2017 09:13 Physicians involved in your care Any changes since last visit?  no   Family History  Problem Relation Age of Onset  . Aneurysm Mother   . Heart attack Father   . Renal cancer Brother   . Kidney disease Brother    Social History   Socioeconomic History  . Marital status: Divorced    Spouse name: Not on file  . Number of children: Not on file  . Years of education: Not on file  . Highest education level: Not on file  Occupational History  . Not on file  Social Needs  . Financial resource strain: Not on file  . Food insecurity:    Worry: Not on file    Inability: Not on file  . Transportation needs:    Medical: Not on file    Non-medical: Not on file  Tobacco Use  . Smoking status: Current Every Day Smoker    Packs/day: 0.25    Types: Cigarettes  . Smokeless tobacco: Never Used  Substance and Sexual Activity  . Alcohol use: Yes    Comment: occ  . Drug use: No  . Sexual activity: Not on file  Lifestyle  . Physical activity:    Days per week: Not on file    Minutes per session: Not on file  . Stress: Not on file  Relationships  . Social connections:    Talks on phone: Not on file    Gets together: Not on file    Attends religious service: Not on file    Active member of club or organization: Not on file    Attends meetings of clubs or organizations: Not on file    Relationship status: Not on file  Other Topics Concern  . Not on file  Social History Narrative  . Not on file   Past Surgical History:  Procedure Laterality Date  . POLYPECTOMY     Past Medical History:  Diagnosis Date  . Hypertension    There were no vitals taken for this visit.  Opioid Risk Score:   Fall Risk Score:  `1  Depression screen PHQ 2/9  Depression screen [facility] 2/9 11/22/2017 09/20/2017 08/16/2017 07/13/2017 06/21/2017 06/01/2017 05/16/2017    Decreased Interest 2 0 0 0 0 0 -  Down, Depressed, Hopeless 2 0 0 0 0 0 0  PHQ - 2 Score 4 0 0 0 0 0 0  Altered sleeping 3 - - - - - -  Tired, decreased energy 2 - - - - - -  Change in appetite 2 - - - - - -  Feeling bad or failure about yourself  2 - - - - - -  Trouble concentrating 1 - - - - - -  Moving slowly or fidgety/restless 2 - - - - - -  Suicidal thoughts 0 - - - - - -  PHQ-9 Score 16 - - - - - -     Review of Systems  Constitutional: Positive for diaphoresis.  HENT: Negative.   Eyes: Negative.   Respiratory: Positive for apnea.   Cardiovascular: Positive for leg swelling.  Gastrointestinal: Positive for abdominal pain and constipation.  Endocrine: Negative.   Genitourinary: Negative.   Musculoskeletal: Positive for joint swelling.  Skin: Negative.   Allergic/Immunologic: Negative.  Neurological: Negative.   Hematological: Negative.   Psychiatric/Behavioral: Negative.   All other systems reviewed and are negative.      Objective:   Physical Exam  Constitutional: He is oriented to person, place, and time. He appears well-developed and well-nourished.  obese  HENT:  Head: Normocephalic and atraumatic.  Eyes: Pupils are equal, round, and reactive to light. Conjunctivae and EOM are normal.  Neck: Normal range of motion.  Cardiovascular: Normal rate, regular rhythm, normal heart sounds and intact distal pulses.  Pulmonary/Chest: Effort normal and breath sounds normal. No respiratory distress. He has no wheezes.  Abdominal: Soft. Bowel sounds are normal. He exhibits no distension. There is no tenderness.  Musculoskeletal: He exhibits no edema.       Right shoulder: He exhibits decreased range of motion, pain and decreased strength. He exhibits no tenderness, no effusion, no crepitus and no deformity.       Left shoulder: He exhibits decreased range of motion, pain and decreased strength. He exhibits no tenderness, no swelling and no effusion.       Cervical back:  Normal.  Neurological: He is alert and oriented to person, place, and time. He displays no tremor. He exhibits normal muscle tone. Coordination and gait normal.  Reflex Scores:      Tricep reflexes are 2+ on the right side and 2+ on the left side.      Bicep reflexes are 2+ on the right side and 2+ on the left side.      Brachioradialis reflexes are 2+ on the right side and 2+ on the left side.      Patellar reflexes are 1+ on the right side and 1+ on the left side.      Achilles reflexes are 1+ on the right side and 1+ on the left side. Motor 5/5 in bilateral delt, bi, tri, grip, HF, KE, ADF  Nursing note and vitals reviewed.  Pain with external ROM, Internal ROM, abd & flexion (at 90deg) No hand edema, no hypersensitivity to touch       Assessment & Plan:  1.  Bilateral adhesive capsulitis of shoulders, idiopathic.  ~35yr duration thus far and would expect resolution within the next year.  Pt states the he gets decent relief from ibuprofen and has no side effects but only is using twice a day.  Pt instructed to increase to TID.  Pt has tried narcotic analgesics and states no sig relief with Hydrocodone (also had abd discomfort) or tramadol.  No relief from Cymbalta.  No steroid dose pack tried. Intra articular steroids with short term relief  Would rec suprascapular nerve block with marcaine on the Right side under ultrasound guidance, will schedule.  May need to perform on left side in future  As discussed with pt , this is a soft tissue problem and does not require schedule 2 narcotic analgesics

## 2017-12-07 ENCOUNTER — Telehealth: Payer: Self-pay | Admitting: Physician Assistant

## 2017-12-07 ENCOUNTER — Encounter: Payer: Self-pay | Admitting: Physical Therapy

## 2017-12-07 ENCOUNTER — Ambulatory Visit: Payer: Self-pay | Attending: Physician Assistant | Admitting: Physical Therapy

## 2017-12-07 DIAGNOSIS — G8929 Other chronic pain: Secondary | ICD-10-CM | POA: Insufficient documentation

## 2017-12-07 DIAGNOSIS — M25512 Pain in left shoulder: Secondary | ICD-10-CM | POA: Insufficient documentation

## 2017-12-07 DIAGNOSIS — M25612 Stiffness of left shoulder, not elsewhere classified: Secondary | ICD-10-CM | POA: Insufficient documentation

## 2017-12-07 DIAGNOSIS — M6281 Muscle weakness (generalized): Secondary | ICD-10-CM | POA: Insufficient documentation

## 2017-12-07 DIAGNOSIS — M25511 Pain in right shoulder: Secondary | ICD-10-CM | POA: Insufficient documentation

## 2017-12-07 DIAGNOSIS — M25611 Stiffness of right shoulder, not elsewhere classified: Secondary | ICD-10-CM | POA: Insufficient documentation

## 2017-12-07 NOTE — Therapy (Addendum)
[place], [place], [address] Phone: [phone]   Fax:  [phone]  Physical Therapy Evaluation/ Discharge Note  Patient Details  Name: Thomas Medina MRN: [id number] Date of Birth: Jul 11, 1956 Referring Provider: Clent Demark PA   Encounter Date: 12/07/2017  PT End of Session - 12/07/17 1415    Visit Number  1    Number of Visits  12    Date for PT Re-Evaluation  01/18/18    Authorization Type  self pay/ CAFA?    PT Start Time  1330    PT Stop Time  1426    PT Time Calculation (min)  56 min    Activity Tolerance  Patient tolerated treatment well    Behavior During Therapy  WFL for tasks assessed/performed       Past Medical History:  Diagnosis Date  . Hypertension     Past Surgical History:  Procedure Laterality Date  . POLYPECTOMY      There were no vitals filed for this visit.   Subjective Assessment - 12/07/17 1333    Subjective  I have problems with my back and my shoulders.  My shoulders are the worst right now.  I cant scratch my back, I have difficulty washing my self.  I have to take extra time to put my shoes on    Pertinent History  Hernia, Unable to lift > 20 lb,     Limitations  Lifting    How long can you sit comfortably?  20 minutes with arms un propped but I feel my arms     How long can you stand comfortably?  10-15 minutes    How long can you walk comfortably?  cant walk 10-15 minutes    Diagnostic tests  x ray    Patient Stated Goals  I want to be able to sleep and use my hands/shoulder to carry groceries and ADL's    Currently in Pain?  Yes    Pain Score  9     Pain Location  Shoulder    Pain Orientation  Right    Pain Descriptors / Indicators  Sore;Throbbing;Pounding;Sharp;Aching    Pain Type  Chronic pain    Pain Onset  More than a month ago 8-9 months    Pain Frequency  Constant    Aggravating Factors   must sleep on back , cant sleep  on side. lifting  groceries     Multiple Pain Sites  Yes    Pain Score  10    Pain Location  Shoulder    Pain Orientation  Left    Pain Descriptors / Indicators  Tightness;Pounding;Throbbing;Sore    Pain Onset  More than a month ago    Pain Frequency  Constant    Aggravating Factors   sleeping must be on back , cant sleep on Side.  Cant put on my shoes and socks, or wash myself         [facility] PT Assessment - 12/07/17 1322      Assessment   Medical Diagnosis  chronic pain of both shoulders    Referring Provider  Clent Demark PA    Onset Date/Surgical Date  -- began 9-12 months ago    Hand Dominance  Right    Next MD Visit  need to schedule    Prior Therapy  yes at this clinic for shoulders      Precautions   Precautions  Other (comment)  Precaution Comments  not carrying over 20 lbs      Restrictions   Weight Bearing Restrictions  No      Balance Screen   Has the patient fallen in the past 6 months  No    Has the patient had a decrease in activity level because of a fear of falling?   No    Is the patient reluctant to leave their home because of a fear of falling?   No      Home Film/video editor residence    Living Arrangements  Children;Non-relatives/Friends    Type of Home  Apartment    Home Access  Stairs to enter    Entrance Stairs-Number of Steps  20    Entrance Stairs-Rails  Can reach both    Home Layout  Two level      Prior Function   Level of Independence  Independent with household mobility without device      Cognition   Overall Cognitive Status  Within Functional Limits for tasks assessed      Observation/Other Assessments   Focus on Therapeutic Outcomes (FOTO)   FOTO intake 29% limitation 71 % predicted 44%       ROM / Strength   AROM / PROM / Strength  AROM;PROM;Strength      AROM   Overall AROM   Deficits    Overall AROM Comments  Pt with pain with every motion    Right Shoulder Extension  12 Degrees    Right Shoulder Flexion  60  Degrees    Right Shoulder ABduction  60 Degrees    Right Shoulder Internal Rotation  9 Degrees able to only touch buttocks bil    Right Shoulder External Rotation  61 Degrees only able to touch upper trap    Left Shoulder Extension  15 Degrees    Left Shoulder Flexion  75 Degrees    Left Shoulder ABduction  75 Degrees    Left Shoulder Internal Rotation  30 Degrees    Left Shoulder External Rotation  72 Degrees      PROM   Overall PROM   Deficits    Right Shoulder Extension  25 Degrees    Right Shoulder Flexion  109 Degrees    Right Shoulder ABduction  100 Degrees    Right Shoulder Internal Rotation  12 Degrees    Right Shoulder External Rotation  65 Degrees    Left Shoulder Extension  30 Degrees    Left Shoulder Flexion  140 Degrees    Left Shoulder ABduction  125 Degrees    Left Shoulder Internal Rotation  35 Degrees    Left Shoulder External Rotation  75 Degrees      Strength   Right Shoulder Flexion  3-/5    Right Shoulder Extension  3-/5    Right Shoulder ABduction  3-/5    Left Shoulder Flexion  3-/5    Left Shoulder Extension  3-/5    Left Shoulder ABduction  3-/5    Right Hand Grip (lbs)  18 average 18, 16,20    Left Hand Grip (lbs)  14.6 ave of 15, 16, 13      Palpation   Palpation comment  Pt with stiffness with capsules bil and very dense tissue and stiffness of bil shoulder musculature, marked TTP for subscapularis and teres major              No data recorded  Objective measurements completed on examination:  See above findings.      [facility] PT Treatment/Exercise - 12/07/17 1410      Self-Care   Self-Care  Other Self-Care Comments    Other Self-Care Comments   education about TPDN after care and precautians      Shoulder Exercises: Supine   Other Supine Exercises  supine cane exercise for flexion, abduction and ER x 10 each bil      Moist Heat Therapy   Moist Heat Location  Shoulder left      Manual Therapy   Manual Therapy  Soft tissue  mobilization    Soft tissue mobilization  subscapularis and upper trap left side       Trigger Point Dry Needling - 12/07/17 1405    Consent Given?  Yes    Education Handout Provided  Yes    Muscles Treated Upper Body  Subscapularis;Upper trapezius left side only    Upper Trapezius Response  Twitch reponse elicited;Palpable increased muscle length    Subscapularis Response  Twitch response elicited;Palpable increased muscle length           PT Education - 12/07/17 1355    Education provided  Yes    Education Details  POC Explanation of findings and course of adhesive capsulitis, Education on iniital HEP and TPDN    Person(s) Educated  Patient    Methods  Explanation;Demonstration;Tactile cues;Verbal cues;Handout    Comprehension  Verbalized understanding;Returned demonstration       PT Short Term Goals - 12/07/17 1339      PT SHORT TERM GOAL #1   Title  STG=LTG    Baseline  --    Time  --    Period  --    Status  --    Target Date  --        PT Long Term Goals - 12/07/17 1341      PT LONG TERM GOAL #1   Title  Pt will be able to carry a 15 lbs of groceries  without exacerbating    Baseline  Pt unable to carry groceries     Time  6    Period  Weeks    Status  New    Target Date  01/18/18      PT LONG TERM GOAL #2   Title  Pt will be able to lift each arm to 120 deg for improved ADLs, home tasks, cooking meals with pain <4/10.     Baseline  9/10 pain and limited AROM  See flowsheet    Time  6    Period  Weeks    Status  New    Target Date  01/18/18      PT LONG TERM GOAL #3   Title  Pt will demo UE strength bilaterally to 4/5 to demonstrate ability to lift arms over head  for light household chores    Baseline  3-/5 today     Time  6    Period  Weeks    Status  New      PT LONG TERM GOAL #4   Title  Pt will improve avg grip strength by 10 lbs each arm     Baseline   Pt today right 14 . 6 lb, left 18 pounds     Time  6    Period  Weeks    Status  New     Target Date  01/18/18      PT LONG TERM GOAL #5   Title  Pt will be able to reach to lower back with each arm for improved ADLs, self care, pain minimal.     Baseline  Pt unable to reach past buttocks    Time  6    Period  Weeks    Status  New    Target Date  01/18/18      PT LONG TERM GOAL #6   Title  "FOTO will improve from 71 % limitation   to  44%limitation   indicating improved functional mobility     Baseline  limitation 71% on eval    Time  6    Period  Weeks    Status  New    Target Date  01/18/18             Plan - 12/07/17 1729    Clinical Impression Statement  Pt returns to clinic for bil shoulder pain and decreasing abilty to use shoulders for ADL's and self care. Pt states he is limited to 20 lb of lifting due to hernia and back pain. Pt pain is 9 to 10/10 in right and left shoulder. Pt also complains of back pain.   Pt states he is unable to sleep on either side and must sleep on back. Pt cannot lift his shoulder above shoulder level and is discouraged about doing daily activities at home.  Pt feels his pain is now more constant and he is becoming increasingly limited. Pt complains that Left is worse than right. Pt denies any neck pain. Pt consented to TPDN and was able to lift left arm over head to 105 post needling.  Pt was closely monitored throughout session.  Pt pleased after RX session and will benefit from skilled PT to address impairments.    History and Personal Factors relevant to plan of care:  Hernia, low back pain , hyperlipidemia Limited to 20 lb of lifting    Clinical Presentation  Stable    Clinical Decision Making  Low    Rehab Potential  Good    PT Frequency  2x / week    PT Duration  6 weeks    PT Treatment/Interventions  ADLs/Self Care Home Management;Electrical Stimulation;Iontophoresis '4mg'$ /ml Dexamethasone;Moist Heat;Ultrasound;Therapeutic exercise;Therapeutic activities;Neuromuscular re-education;Patient/family education;Manual  techniques;Passive range of motion;Dry needling;Taping    PT Next Visit Plan  Assess Dry needling and progress strengthening as able    PT Home Exercise Plan  supine cane exercises    Consulted and Agree with Plan of Care  Patient       Patient will benefit from skilled therapeutic intervention in order to improve the following deficits and impairments:  Postural dysfunction, Improper body mechanics, Pain, Decreased range of motion, Decreased strength, Increased fascial restricitons, Impaired UE functional use, Impaired flexibility, Increased muscle spasms, Obesity  Visit Diagnosis: Chronic left shoulder pain  Muscle weakness (generalized)  Chronic right shoulder pain  Stiffness of left shoulder, not elsewhere classified  Stiffness of right shoulder, not elsewhere classified     Problem List Patient Active Problem List   Diagnosis Date Noted  . Hyperlipidemia 03/29/2017  . Hypertension 03/27/2017    Voncille Lo, PT Certified Exercise Expert for the Aging Adult  12/07/17 5:50 PM Phone: [phone] Fax: [facility] [address] [place], [place], [address] Phone: [phone]   Fax:  [phone]  Name: Thomas Medina MRN: [id number] Date of Birth: [birth year]   PHYSICAL THERAPY DISCHARGE SUMMARY  Visits from Start of Care: 1  Current functional level related to  goals / functional outcomes: unknown   Remaining deficits: unknown   Education / Equipment: Initial eval and initial HEP Plan:                                                    Patient goals were not met. Patient is being discharged due to not returning since the last visit.  ?????    Voncille Lo, PT Certified Exercise Expert for the Aging Adult  02/08/18 10:17 AM Phone: [phone] Fax: [phone]

## 2017-12-07 NOTE — Telephone Encounter (Signed)
Call placed to the [employer] #[phone], to check on the status of patient's application and to make payment. No answer. Left voice message asking for a call back at [phone].

## 2017-12-07 NOTE — Patient Instructions (Signed)
Trigger Point Dry Needling  . What is Trigger Point Dry Needling (DN)? o DN is a physical therapy technique used to treat muscle pain and dysfunction. Specifically, DN helps deactivate muscle trigger points (muscle knots).  o A thin filiform needle is used to penetrate the skin and stimulate the underlying trigger point. The goal is for a local twitch response (LTR) to occur and for the trigger point to relax. No medication of any kind is injected during the procedure.   . What Does Trigger Point Dry Needling Feel Like?  o The procedure feels different for each individual patient. Some patients report that they do not actually feel the needle enter the skin and overall the process is not painful. Very mild bleeding may occur. However, many patients feel a deep cramping in the muscle in which the needle was inserted. This is the local twitch response.   Marland Kitchen. How Will I feel after the treatment? o Soreness is normal, and the onset of soreness may not occur for a few hours. Typically this soreness does not last longer than two days.  o Bruising is uncommon, however; ice can be used to decrease any possible bruising.  o In rare cases feeling tired or nauseous after the treatment is normal. In addition, your symptoms may get worse before they get better, this period will typically not last longer than 24 hours.   . What Can I do After My Treatment? o Increase your hydration by drinking more water for the next 24 hours. o You may place ice or heat on the areas treated that have become sore, however, do not use heat on inflamed or bruised areas. Heat often brings more relief post needling. o You can continue your regular activities, but vigorous activity is not recommended initially after the treatment for 24 hours. o DN is best combined with other physical therapy such as strengthening, stretching, and other therapies.  SHOULDER: External Rotation - Supine (Cane)   Hold cane with both hands. Rotate arm  away from body. Keep elbow on floor and next to body. _10__ reps per set, _2__ sets per day, __7_ days per week Add towel to keep elbow at side.  Copyright  VHI. All rights reserved.  Cane Horizontal - Supine   With straight arms holding cane above shoulders, bring cane out to right, center, out to left, and back to above head. Repeat 10___ times. Do _1-2__ times per day.  Copyright  VHI. All rights reserved.  Cane Exercise: Flexion   Lie on back, holding cane above chest. Keeping arms as straight as possible, lower cane toward floor beyond head. Hold __10 __ seconds.for good stretch Repeat _10___ times. Do 1-2____ sessions per day.  http://gt2.exer.us/91   Copyright  VHI. All rights reserved.    Garen LahLawrie Beardsley, PT Certified Exercise Expert for the Aging Adult  12/07/17 1:53 PM Phone: [phone] Fax: [phone]

## 2017-12-08 ENCOUNTER — Telehealth (INDEPENDENT_AMBULATORY_CARE_PROVIDER_SITE_OTHER): Payer: Self-pay | Admitting: Physician Assistant

## 2017-12-08 NOTE — Telephone Encounter (Signed)
Pt called to speak with the nurse, he want to know if the PCP can write a letter generic for Disability that he can not work due to his medical condition since he was turn down when he apply for disability, please let him know if the pcp can do this for him, please follow up

## 2017-12-08 NOTE — Telephone Encounter (Signed)
Patient aware that he needs to contact disability to fin out the next steps after being denied. PCP has already provided disability with the necessary medical documentation they asked for. Maryjean Mornempestt S Tasmin Exantus, CMA

## 2017-12-11 MED FILL — ?CLONIDINE HCL 0.1 MG TABL: 0.1 | 30 days supply | Qty: 60 | Fill #0

## 2017-12-11 MED FILL — METOPROLOL SUCCINATE ER 50: 50 | 30 days supply | Qty: 30 | Fill #6

## 2017-12-14 MED FILL — IBUPROFEN 600 MG TABLET: 600 | 30 days supply | Qty: 90 | Fill #0

## 2017-12-15 ENCOUNTER — Telehealth: Payer: Self-pay | Admitting: Physician Assistant

## 2017-12-15 MED FILL — HYDROCHLOROTHIAZIDE 25 MG T: 25 | 30 days supply | Qty: 30 | Fill #7

## 2017-12-15 MED FILL — !VIAGRA 100MG TABLET: 100 | 30 days supply | Qty: 10 | Fill #4

## 2017-12-15 NOTE — Telephone Encounter (Signed)
Call placed to the [employer] #[phone] to make payment for patient's machine. No answer. Left a message asking for a call back at [phone].

## 2017-12-19 MED FILL — LOVASTATIN 20 MG TABS: 20 | 30 days supply | Qty: 30 | Fill #7

## 2017-12-19 MED FILL — FUROSEMIDE 40 MG TAB: 40 | 30 days supply | Qty: 30 | Fill #2

## 2017-12-19 MED FILL — LOSARTAN POTASSIUM 100 MG T: 100 | 30 days supply | Qty: 30 | Fill #7

## 2017-12-21 ENCOUNTER — Ambulatory Visit (INDEPENDENT_AMBULATORY_CARE_PROVIDER_SITE_OTHER): Payer: Self-pay | Admitting: Physician Assistant

## 2017-12-21 ENCOUNTER — Other Ambulatory Visit: Payer: Self-pay

## 2017-12-21 ENCOUNTER — Encounter (INDEPENDENT_AMBULATORY_CARE_PROVIDER_SITE_OTHER): Payer: Self-pay | Admitting: Physician Assistant

## 2017-12-21 VITALS — BP 99/58 | HR 79 | Temp 98.1°F | Ht 66.0 in | Wt 270.4 lb

## 2017-12-21 DIAGNOSIS — M7502 Adhesive capsulitis of left shoulder: Secondary | ICD-10-CM

## 2017-12-21 DIAGNOSIS — M7501 Adhesive capsulitis of right shoulder: Secondary | ICD-10-CM

## 2017-12-21 MED ORDER — HYDROCODONE-ACETAMINOPHEN 5-325 MG PO TABS: 1.0000 | ORAL_TABLET | Freq: Three times a day (TID) | ORAL | 0 refills | Status: DC | PRN

## 2017-12-21 NOTE — Progress Notes (Signed)
Subjective:  Patient ID: Thomas Medina, male    DOB: 1956-05-10  Age: 62 y.o. MRN: [id number]  CC: back pain and shoulder pain  HPI  Thomas Medina a 62 y.o.malewith a medical history of OSA, HTN, and adhesive capsulitis presents with bilateral shoulder pain R > L. Has seen orthopedics 6 weeks ago and has been doing rehab. Had needling done at rehab which allowed greater aROM "for one day". He has also gone to pain clinic and is scheduled for a suprascapular nerve block. Pt states pain clinic doctor told him to go to PCP for oral opioid prescription. Does not think he is any better overall despite efforts from ortho, rehab, and pain clinic. Request medication to help relieve his pain. No other associated symptoms or complaints.      Outpatient Medications Prior to Visit  Medication Sig Dispense Refill  . amLODipine (NORVASC) 10 MG tablet Take 1 tablet (10 mg total) by mouth daily. 90 tablet 3  . cloNIDine (CATAPRES) 0.1 MG tablet Take 1 tablet (0.1 mg total) by mouth 2 (two) times daily. 180 tablet 1  . DULoxetine (CYMBALTA) 20 MG capsule Take 40 mg by mouth daily.    . furosemide (LASIX) 40 MG tablet Take 1 tablet (40 mg total) by mouth daily. 90 tablet 3  . HYDROcodone-acetaminophen (NORCO) 5-325 MG tablet Take 1 tablet by mouth every 12 (twelve) hours. 60 tablet 0  . hydrOXYzine (ATARAX/VISTARIL) 50 MG tablet Take 50 mg by mouth at bedtime.    Marland Kitchen. ibuprofen (ADVIL,MOTRIN) 600 MG tablet Take 1 tablet (600 mg total) by mouth 3 (three) times daily. TAKE WITH FOOD 90 tablet 1  . losartan (COZAAR) 100 MG tablet Take 1 tablet (100 mg total) by mouth daily. 90 tablet 3  . lovastatin (MEVACOR) 20 MG tablet Take 1 tablet (20 mg total) by mouth at bedtime. 90 tablet 3  . metoprolol succinate (TOPROL-XL) 50 MG 24 hr tablet Take 1 tablet (50 mg total) by mouth daily. Take with or immediately following a meal. 90 tablet 3  . sildenafil (VIAGRA) 100 MG tablet Take 1 tablet (100 mg total) by mouth  daily as needed for erectile dysfunction. 30 tablet 3  . tolnaftate (TINACTIN) 1 % powder Apply 1 application topically 2 (two) times daily. 45 g 0   No facility-administered medications prior to visit.      ROS Review of Systems  Constitutional: Negative for chills, fever and malaise/fatigue.  Eyes: Negative for blurred vision.  Respiratory: Negative for shortness of breath.   Cardiovascular: Negative for chest pain and palpitations.  Gastrointestinal: Negative for abdominal pain and nausea.  Genitourinary: Negative for dysuria and hematuria.  Musculoskeletal: Positive for joint pain. Negative for myalgias.  Skin: Negative for rash.  Neurological: Negative for tingling and headaches.  Psychiatric/Behavioral: Negative for depression. The patient is not nervous/anxious.     Objective:  BP (!) 99/58 (BP Location: Left Arm, Patient Position: Sitting, Cuff Size: Large)   Pulse 79   Temp 98.1 F (36.7 C) (Oral)   Ht 5\' 6"  (1.676 m)   Wt 270 lb 6.4 oz (122.7 kg)   SpO2 94%   BMI 43.64 kg/m   BP/Weight 12/21/2017 12/01/2017 11/22/2017  Systolic BP 99 120 151  Diastolic BP 58 77 78  Wt. (Lbs) 270.4 - 267.8  BMI 43.64 - 43.22      Physical Exam  Constitutional: He is oriented to person, place, and time.  Well developed, well nourished, NAD, polite  HENT:  Head:  Normocephalic and atraumatic.  Eyes: No scleral icterus.  Neck: Normal range of motion. Neck supple. No thyromegaly present.  Cardiovascular: Normal rate, regular rhythm and normal heart sounds.  Pulmonary/Chest: Effort normal and breath sounds normal.  Abdominal: Soft. Bowel sounds are normal. There is no tenderness.  Musculoskeletal: He exhibits no edema.  Neurological: He is alert and oriented to person, place, and time.  Skin: Skin is warm and dry. No rash noted. No erythema. No pallor.  Psychiatric: He has a normal mood and affect. His behavior is normal. Thought content normal.  Vitals  reviewed.    Assessment & Plan:    1. Adhesive capsulitis of both shoulders - [facility] reportedly does not prescribe opioid narcotics. Begin HYDROcodone-acetaminophen (NORCO/VICODIN) 5-325 MG tablet; Take 1 tablet by mouth 3 (three) times daily as needed for moderate pain.  Dispense: 90 tablet; Refill: 0 - keep upcoming appointments with rehab, pain clinic, and orthopedics.   Meds ordered this encounter  Medications  . HYDROcodone-acetaminophen (NORCO/VICODIN) 5-325 MG tablet    Sig: Take 1 tablet by mouth 3 (three) times daily as needed for moderate pain.    Dispense:  90 tablet    Refill:  0    Order Specific Question:   Supervising Provider    Answer:   Quentin Angst [id number]    Follow-up: Return in about 3 months (around 03/22/2018) for shoulder pain.   Loletta Specter PA

## 2017-12-21 NOTE — Patient Instructions (Signed)
Acetaminophen; Hydrocodone tablets or capsules What is this medicine? ACETAMINOPHEN; HYDROCODONE (a set a MEE noe fen; hye droe KOE done) is a pain reliever. It is used to treat moderate to severe pain. This medicine may be used for other purposes; ask your health care provider or pharmacist if you have questions. COMMON BRAND NAME(S): Anexsia, Bancap HC, Ceta-Plus, Co-Gesic, Comfortpak, Dolagesic, Dolorex Forte, DuoCet, Hydrocet, Hydrogesic, Lorcet, Lorcet HD, Lorcet Plus, Lortab, Margesic H, Maxidone, Norco, Polygesic, Stagesic, Vanacet, Verdrocet, Vicodin, Vicodin ES, Vicodin HP, Xodol, Zydone What should I tell my health care provider before I take this medicine? They need to know if you have any of these conditions: -brain tumor -Crohn's disease, inflammatory bowel disease, or ulcerative colitis -drug abuse or addiction -head injury -heart or circulation problems -if you often drink alcohol -kidney disease or problems going to the bathroom -liver disease -lung disease, asthma, or breathing problems -an unusual or allergic reaction to acetaminophen, hydrocodone, other opioid analgesics, other medicines, foods, dyes, or preservatives -pregnant or trying to get pregnant -breast-feeding How should I use this medicine? Take this medicine by mouth with a glass of water. Follow the directions on the prescription label. You can take it with or without food. If it upsets your stomach, take it with food. Do not take your medicine more often than directed. A special MedGuide will be given to you by the pharmacist with each prescription and refill. Be sure to read this information carefully each time. Talk to your pediatrician regarding the use of this medicine in children. Special care may be needed. Overdosage: If you think you have taken too much of this medicine contact a poison control center or emergency room at once. NOTE: This medicine is only for you. Do not share this medicine with  others. What if I miss a dose? If you miss a dose, take it as soon as you can. If it is almost time for your next dose, take only that dose. Do not take double or extra doses. What may interact with this medicine? This medicine may interact with the following medications: -alcohol -antiviral medicines for HIV or AIDS -atropine -antihistamines for allergy, cough and cold -certain antibiotics like erythromycin, clarithromycin -certain medicines for anxiety or sleep -certain medicines for bladder problems like oxybutynin, tolterodine -certain medicines for depression like amitriptyline, fluoxetine, sertraline -certain medicines for fungal infections like ketoconazole and itraconazole -certain medicines for Parkinson's disease like benztropine, trihexyphenidyl -certain medicines for seizures like carbamazepine, phenobarbital, phenytoin, primidone -certain medicines for stomach problems like dicyclomine, hyoscyamine -certain medicines for travel sickness like scopolamine -general anesthetics like halothane, isoflurane, methoxyflurane, propofol -ipratropium -local anesthetics like lidocaine, pramoxine, tetracaine -MAOIs like Carbex, Eldepryl, Marplan, Nardil, and Parnate -medicines that relax muscles for surgery -other medicines with acetaminophen -other narcotic medicines for pain or cough -phenothiazines like chlorpromazine, mesoridazine, prochlorperazine, thioridazine -rifampin This list may not describe all possible interactions. Give your health care provider a list of all the medicines, herbs, non-prescription drugs, or dietary supplements you use. Also tell them if you smoke, drink alcohol, or use illegal drugs. Some items may interact with your medicine. What should I watch for while using this medicine? Tell your doctor or health care professional if your pain does not go away, if it gets worse, or if you have new or a different type of pain. You may develop tolerance to the medicine.  Tolerance means that you will need a higher dose of the medicine for pain relief. Tolerance is normal and is expected if   you take the medicine for a long time. Do not suddenly stop taking your medicine because you may develop a severe reaction. Your body becomes used to the medicine. This does NOT mean you are addicted. Addiction is a behavior related to getting and using a drug for a non-medical reason. If you have pain, you have a medical reason to take pain medicine. Your doctor will tell you how much medicine to take. If your doctor wants you to stop the medicine, the dose will be slowly lowered over time to avoid any side effects. There are different types of narcotic medicines (opiates). If you take more than one type at the same time or if you are taking another medicine that also causes drowsiness, you may have more side effects. Give your health care provider a list of all medicines you use. Your doctor will tell you how much medicine to take. Do not take more medicine than directed. Call emergency for help if you have problems breathing or unusual sleepiness. Do not take other medicines that contain acetaminophen with this medicine. Always read labels carefully. If you have questions, ask your doctor or pharmacist. If you take too much acetaminophen get medical help right away. Too much acetaminophen can be very dangerous and cause liver damage. Even if you do not have symptoms, it is important to get help right away. You may get drowsy or dizzy. Do not drive, use machinery, or do anything that needs mental alertness until you know how this medicine affects you. Do not stand or sit up quickly, especially if you are an older patient. This reduces the risk of dizzy or fainting spells. Alcohol may interfere with the effect of this medicine. Avoid alcoholic drinks. The medicine will cause constipation. Try to have a bowel movement at least every 2 to 3 days. If you do not have a bowel movement for 3  days, call your doctor or health care professional. Your mouth may get dry. Chewing sugarless gum or sucking hard candy, and drinking plenty of water may help. Contact your doctor if the problem does not go away or is severe. What side effects may I notice from receiving this medicine? Side effects that you should report to your doctor or health care professional as soon as possible: -allergic reactions like skin rash, itching or hives, swelling of the face, lips, or tongue -breathing problems -confusion -redness, blistering, peeling or loosening of the skin, including inside the mouth -signs and symptoms of low blood pressure like dizziness; feeling faint or lightheaded, falls; unusually weak or tired -trouble passing urine or change in the amount of urine -yellowing of the eyes or skin Side effects that usually do not require medical attention (report to your doctor or health care professional if they continue or are bothersome): -constipation -dry mouth -nausea, vomiting -tiredness This list may not describe all possible side effects. Call your doctor for medical advice about side effects. You may report side effects to FDA at 1-800-FDA-1088. Where should I keep my medicine? Keep out of the reach of children. This medicine can be abused. Keep your medicine in a safe place to protect it from theft. Do not share this medicine with anyone. Selling or giving away this medicine is dangerous and against the law. This medicine may cause accidental overdose and death if it taken by other adults, children, or pets. Mix any unused medicine with a substance like cat litter or coffee grounds. Then throw the medicine away in a sealed container like   a sealed bag or a coffee can with a lid. Do not use the medicine after the expiration date. Store at room temperature between 15 and 30 degrees C (59 and 86 degrees F). NOTE: This sheet is a summary. It may not cover all possible information. If you have  questions about this medicine, talk to your doctor, pharmacist, or health care provider.  2018 Elsevier/Gold Standard (2015-05-22 10:02:16)  

## 2017-12-22 ENCOUNTER — Encounter: Payer: Self-pay | Attending: Physical Medicine & Rehabilitation

## 2017-12-22 ENCOUNTER — Telehealth: Payer: Self-pay | Admitting: Physician Assistant

## 2017-12-22 ENCOUNTER — Ambulatory Visit: Payer: Self-pay | Admitting: Physical Medicine & Rehabilitation

## 2017-12-22 DIAGNOSIS — I10 Essential (primary) hypertension: Secondary | ICD-10-CM | POA: Insufficient documentation

## 2017-12-22 DIAGNOSIS — Z9889 Other specified postprocedural states: Secondary | ICD-10-CM | POA: Insufficient documentation

## 2017-12-22 DIAGNOSIS — F17210 Nicotine dependence, cigarettes, uncomplicated: Secondary | ICD-10-CM | POA: Insufficient documentation

## 2017-12-22 DIAGNOSIS — Z8051 Family history of malignant neoplasm of kidney: Secondary | ICD-10-CM | POA: Insufficient documentation

## 2017-12-22 DIAGNOSIS — M7501 Adhesive capsulitis of right shoulder: Secondary | ICD-10-CM | POA: Insufficient documentation

## 2017-12-22 DIAGNOSIS — Z8249 Family history of ischemic heart disease and other diseases of the circulatory system: Secondary | ICD-10-CM | POA: Insufficient documentation

## 2017-12-22 DIAGNOSIS — M7502 Adhesive capsulitis of left shoulder: Secondary | ICD-10-CM | POA: Insufficient documentation

## 2017-12-22 NOTE — Telephone Encounter (Signed)
Call placed to the [employer] #[phone], to check if patient made his payment and to make our payment for CPAP machine ($80). No answer. Left a message asking for a call back at [phone].

## 2017-12-28 ENCOUNTER — Other Ambulatory Visit (INDEPENDENT_AMBULATORY_CARE_PROVIDER_SITE_OTHER): Payer: Self-pay | Admitting: Physician Assistant

## 2017-12-28 DIAGNOSIS — M545 Low back pain, unspecified: Secondary | ICD-10-CM

## 2017-12-28 DIAGNOSIS — G8929 Other chronic pain: Secondary | ICD-10-CM

## 2017-12-28 MED FILL — AMLODIPINE BESYLATE 10 MG T: 10 | 30 days supply | Qty: 30 | Fill #7

## 2017-12-28 NOTE — Telephone Encounter (Signed)
FWD to PCP. Tempestt S Roberts, CMA  

## 2018-01-08 ENCOUNTER — Ambulatory Visit (INDEPENDENT_AMBULATORY_CARE_PROVIDER_SITE_OTHER): Payer: Self-pay | Admitting: Orthopedic Surgery

## 2018-01-16 MED FILL — $VIAGRA 100 MG TABLET: 100 | 30 days supply | Qty: 10 | Fill #5

## 2018-01-16 MED FILL — METOPROLOL SUCCINATE ER 50: 50 | 30 days supply | Qty: 30 | Fill #7

## 2018-01-16 MED FILL — LOVASTATIN 20 MG TABLET: 20 | 30 days supply | Qty: 30 | Fill #8

## 2018-01-16 MED FILL — ?FUROSEMIDE 40 MG TABLET: 40 | 30 days supply | Qty: 30 | Fill #3

## 2018-01-16 MED FILL — LOSARTAN POTASSIUM 100 MG T: 100 | 30 days supply | Qty: 30 | Fill #8

## 2018-01-18 ENCOUNTER — Ambulatory Visit (INDEPENDENT_AMBULATORY_CARE_PROVIDER_SITE_OTHER): Payer: Self-pay | Admitting: Orthopedic Surgery

## 2018-01-18 ENCOUNTER — Encounter (INDEPENDENT_AMBULATORY_CARE_PROVIDER_SITE_OTHER): Payer: Self-pay | Admitting: Orthopedic Surgery

## 2018-01-18 DIAGNOSIS — M7502 Adhesive capsulitis of left shoulder: Secondary | ICD-10-CM

## 2018-01-19 ENCOUNTER — Telehealth: Payer: Self-pay | Admitting: Physician Assistant

## 2018-01-19 NOTE — Telephone Encounter (Signed)
Spoke with Aundra Millet from [employer] [phone] and a payment of $100 was made towards patient's machine. Transaction ID is [id number].

## 2018-01-21 ENCOUNTER — Encounter (INDEPENDENT_AMBULATORY_CARE_PROVIDER_SITE_OTHER): Payer: Self-pay | Admitting: Orthopedic Surgery

## 2018-01-21 DIAGNOSIS — M7502 Adhesive capsulitis of left shoulder: Secondary | ICD-10-CM

## 2018-01-21 MED ORDER — LIDOCAINE HCL 1 % IJ SOLN
5.0000 mL | INTRAMUSCULAR | Status: AC | PRN
  Administered 2018-01-21: 5 mL

## 2018-01-21 MED ORDER — BUPIVACAINE HCL 0.5 % IJ SOLN
9.0000 mL | INTRAMUSCULAR | Status: AC | PRN
  Administered 2018-01-21: 9 mL via INTRA_ARTICULAR

## 2018-01-21 MED ORDER — METHYLPREDNISOLONE ACETATE 40 MG/ML IJ SUSP
40.0000 mg | INTRAMUSCULAR | Status: AC | PRN
  Administered 2018-01-21: 40 mg via INTRA_ARTICULAR

## 2018-01-21 NOTE — Progress Notes (Signed)
Office Visit Note   Patient: Thomas Medina           Date of Birth: [birth year]           MRN: [id number] Visit Date: 01/18/2018 Requested by: Loletta Specter, PA-C [address] [place], [place] [address] PCP: Denny Levy  Subjective: Chief Complaint  Patient presents with  . Right Shoulder - Follow-up  . Left Shoulder - Follow-up    HPI: [name] is a patient with bilateral frozen shoulder.  His right shoulder was injected 11/08/2017 with good relief and improvement in range of motion.  He describes similar symptoms in the left shoulder.  He would like to have the left shoulder injected today.  He has been doing some home exercises.              ROS: All systems reviewed are negative as they relate to the chief complaint within the history of present illness.  Patient denies  fevers or chills.   Assessment & Plan: Visit Diagnoses:  1. Adhesive capsulitis of left shoulder     Plan: Impression is bilateral shoulder adhesive capsulitis with good result from injection in the right shoulder.  Plan is to inject that shoulder on the left-hand side today.  On the right-hand side he is got 90 degrees of forward flexion which is an improvement.  I will see him back as needed  Follow-Up Instructions: Return if symptoms worsen or fail to improve.   Orders:  No orders of the defined types were placed in this encounter.  No orders of the defined types were placed in this encounter.     Procedures: Large Joint Inj: L glenohumeral on 01/21/2018 9:02 PM Indications: diagnostic evaluation and pain Details: 18 G 1.5 in needle, posterior approach  Arthrogram: No  Medications: 9 mL bupivacaine 0.5 %; 40 mg methylPREDNISolone acetate 40 MG/ML; 5 mL lidocaine 1 % Outcome: tolerated well, no immediate complications Procedure, treatment alternatives, risks and benefits explained, specific risks discussed. Consent was given by the patient. Immediately prior to procedure a time  out was called to verify the correct patient, procedure, equipment, support staff and site/side marked as required. Patient was prepped and draped in the usual sterile fashion.       Clinical Data: No additional findings.  Objective: Vital Signs: There were no vitals taken for this visit.  Physical Exam:   Constitutional: Patient appears well-developed HEENT:  Head: Normocephalic Eyes:EOM are normal Neck: Normal range of motion Cardiovascular: Normal rate Pulmonary/chest: Effort normal Neurologic: Patient is alert Skin: Skin is warm Psychiatric: Patient has normal mood and affect    Ortho Exam: Orthopedic exam demonstrates good cervical spine range of motion.  No paresthesias C5-T1.  Symmetric reflexes bilateral biceps and triceps.  Has good rotator cuff strength isolated infraspinatus supraspinatus and subscap muscle testing.  No masses lymphadenopathy or skin changes noted in the shoulder girdle region.  On the right shoulder he has 90 degrees plus of forward flexion.  On the left he is little shy of 90 degrees.  Also has less external rotation at 15 degrees of abduction on the left compared to the right.  In general the left shoulder is a little bit stiff but has good strength.  Specialty Comments:  No specialty comments available.  Imaging: No results found.   PMFS History: Patient Active Problem List   Diagnosis Date Noted  . Hyperlipidemia 03/29/2017  . Hypertension 03/27/2017   Past Medical History:  Diagnosis Date  .  Hypertension     Family History  Problem Relation Age of Onset  . Aneurysm Mother   . Heart attack Father   . Renal cancer Brother   . Kidney disease Brother     Past Surgical History:  Procedure Laterality Date  . POLYPECTOMY     Social History   Occupational History  . Not on file  Tobacco Use  . Smoking status: Current Every Day Smoker    Packs/day: 0.25    Types: Cigarettes  . Smokeless tobacco: Never Used  Substance and  Sexual Activity  . Alcohol use: Yes    Comment: occ  . Drug use: No  . Sexual activity: Not on file

## 2018-01-29 ENCOUNTER — Telehealth (INDEPENDENT_AMBULATORY_CARE_PROVIDER_SITE_OTHER): Payer: Self-pay | Admitting: Physician Assistant

## 2018-01-29 NOTE — Telephone Encounter (Signed)
Patient advised to go to urgent care for symptoms since they are no available appointment slots. Maryjean Morn, CMA

## 2018-01-29 NOTE — Telephone Encounter (Signed)
Thomas Medina wants to talk to you  Regarding Dizziness and meds  I offer him and appointment for today but he refuse

## 2018-01-30 MED FILL — AMLODIPINE BESYLATE 10 MG T: 10 | 30 days supply | Qty: 30 | Fill #8

## 2018-01-30 MED FILL — HYDROCHLOROTHIAZIDE 25 MG T: 25 | 30 days supply | Qty: 30 | Fill #8

## 2018-02-13 ENCOUNTER — Telehealth: Payer: Self-pay | Admitting: Physician Assistant

## 2018-02-13 NOTE — Telephone Encounter (Signed)
Call placed to patient #[phone], to inform him that Cpap machine arrived. Spoke with patient and informed him that machined was delivered and cpap teaching has been scheduled [facility] next [facility]. Patient understood and confirmed.

## 2018-02-14 MED FILL — $VIAGRA 100 MG TABLET: 100 | 30 days supply | Qty: 10 | Fill #6

## 2018-02-14 MED FILL — ?FUROSEMIDE 40 MG TABLET: 40 | 30 days supply | Qty: 30 | Fill #4

## 2018-02-14 MED FILL — METOPROLOL SUCCINATE ER 50: 50 | 30 days supply | Qty: 30 | Fill #8

## 2018-02-14 MED FILL — ?CLONIDINE HCL 0.1 MG TABL: 0.1 | 30 days supply | Qty: 60 | Fill #1

## 2018-02-19 ENCOUNTER — Ambulatory Visit (INDEPENDENT_AMBULATORY_CARE_PROVIDER_SITE_OTHER): Payer: Medicaid Other | Admitting: Orthopedic Surgery

## 2018-02-19 ENCOUNTER — Encounter (INDEPENDENT_AMBULATORY_CARE_PROVIDER_SITE_OTHER): Payer: Self-pay | Admitting: Orthopedic Surgery

## 2018-02-19 DIAGNOSIS — M7502 Adhesive capsulitis of left shoulder: Secondary | ICD-10-CM | POA: Diagnosis not present

## 2018-02-19 NOTE — Progress Notes (Signed)
   Office Visit Note   Patient: Thomas Medina           Date of Birth: 10-Jan-1956           MRN: [id number] Visit Date: 02/19/2018 Requested by: Loletta SpecterGomez, Roger David, PA-C [address] [place], [place] [id number] PCP: Denny LevyGomez, Roger David, PA-C  Subjective: Chief Complaint  Patient presents with  . Right Shoulder - Follow-up  . Left Shoulder - Follow-up    HPI: Thomas Medina is a patient with bilateral frozen shoulder.  Left shoulder injected 01/18/2018 right shoulder injected 11/08/2017.  He states his range of motion is better.  He is sleeping better.  He is on ibuprofen.  He has not done any heavy lifting yet.              ROS: All systems reviewed are negative as they relate to the chief complaint within the history of present illness.  Patient denies  fevers or chills.   Assessment & Plan: Visit Diagnoses:  1. Adhesive capsulitis of left shoulder     Plan: Impression is bilateral frozen shoulder improved with injections.  I will see him back in 4 months as needed but in general his shoulders look much improved.  He is doing a good job with his home exercise program to maintain gains in range of motion  Follow-Up Instructions: Return in about 4 months (around 06/21/2018), or if symptoms worsen or fail to improve.   Orders:  No orders of the defined types were placed in this encounter.  No orders of the defined types were placed in this encounter.     Procedures: No procedures performed   Clinical Data: No additional findings.  Objective: Vital Signs: There were no vitals taken for this visit.  Physical Exam:   Constitutional: Patient appears well-developed HEENT:  Head: Normocephalic Eyes:EOM are normal Neck: Normal range of motion Cardiovascular: Normal rate Pulmonary/chest: Effort normal Neurologic: Patient is alert Skin: Skin is warm Psychiatric: Patient has normal mood and affect    Ortho Exam: Ortho examination today demonstrates external rotation of 50  degrees of abduction in both shoulders to about 40 to 45 degrees.  Isolated glenohumeral abduction to 90 degrees bilaterally.  Forward flexion 160 bilaterally.  Rotator cuff strength intact in both shoulders.  Motor or sensory function to the hands intact in both hands.  Specialty Comments:  No specialty comments available.  Imaging: No results found.   PMFS History: Patient Active Problem List   Diagnosis Date Noted  . Hyperlipidemia 03/29/2017  . Hypertension 03/27/2017   Past Medical History:  Diagnosis Date  . Hypertension     Family History  Problem Relation Age of Onset  . Aneurysm Mother   . Heart attack Father   . Renal cancer Brother   . Kidney disease Brother     Past Surgical History:  Procedure Laterality Date  . POLYPECTOMY     Social History   Occupational History  . Not on file  Tobacco Use  . Smoking status: Current Every Day Smoker    Packs/day: 0.25    Types: Cigarettes  . Smokeless tobacco: Never Used  Substance and Sexual Activity  . Alcohol use: Yes    Comment: occ  . Drug use: No  . Sexual activity: Not on file

## 2018-02-21 ENCOUNTER — Encounter (INDEPENDENT_AMBULATORY_CARE_PROVIDER_SITE_OTHER): Payer: Self-pay | Admitting: Physician Assistant

## 2018-02-21 ENCOUNTER — Ambulatory Visit (INDEPENDENT_AMBULATORY_CARE_PROVIDER_SITE_OTHER): Payer: Medicaid Other | Admitting: Physician Assistant

## 2018-02-21 ENCOUNTER — Other Ambulatory Visit: Payer: Self-pay

## 2018-02-21 VITALS — BP 131/74 | HR 77 | Temp 97.5°F | Ht 67.5 in | Wt 285.6 lb

## 2018-02-21 DIAGNOSIS — R0609 Other forms of dyspnea: Secondary | ICD-10-CM | POA: Diagnosis not present

## 2018-02-21 DIAGNOSIS — R609 Edema, unspecified: Secondary | ICD-10-CM | POA: Diagnosis not present

## 2018-02-21 DIAGNOSIS — K5900 Constipation, unspecified: Secondary | ICD-10-CM | POA: Diagnosis not present

## 2018-02-21 DIAGNOSIS — E119 Type 2 diabetes mellitus without complications: Secondary | ICD-10-CM

## 2018-02-21 LAB — POCT GLYCOSYLATED HEMOGLOBIN (HGB A1C): Hemoglobin A1C: 6.9 % — AB (ref 4.0–5.6)

## 2018-02-21 MED ORDER — METFORMIN HCL ER 500 MG PO TB24: 500.0000 mg | ORAL_TABLET | Freq: Every day | ORAL | 5 refills | Status: DC

## 2018-02-21 MED FILL — METFORMIN HCL ER 500 MG TAB: 500 | 30 days supply | Qty: 30 | Fill #0

## 2018-02-21 NOTE — Progress Notes (Signed)
Subjective:  Patient ID: Thomas Medina, male    DOB: [birth year]  Age: 62 y.o. MRN: [id number]  CC: dizziness  HPI Thomas Medina a 62 y.o.malewith a medical history of OSA, HTN, and adhesive capsulitis presents with dizziness. Onset of dizziness since one week ago. Feels "woozy" upon standing.  Had a few episodes of lightheadedness but feels he is doing better now since drinking more water. Found out to drink more water from videos on YouTube. Does not endorse CP, palpitations, SOB, tingling, numbness, weakness, PND, orthopnea, presyncope, syncope, abdominal pain, or GI/GU sxs.           Outpatient Medications Prior to Visit  Medication Sig Dispense Refill  . amLODipine (NORVASC) 10 MG tablet Take 1 tablet (10 mg total) by mouth daily. 90 tablet 3  . cloNIDine (CATAPRES) 0.1 MG tablet Take 1 tablet (0.1 mg total) by mouth 2 (two) times daily. 180 tablet 1  . DULoxetine (CYMBALTA) 20 MG capsule Take 40 mg by mouth daily.    . furosemide (LASIX) 40 MG tablet Take 1 tablet (40 mg total) by mouth daily. 90 tablet 3  . hydrOXYzine (ATARAX/VISTARIL) 50 MG tablet Take 50 mg by mouth at bedtime.    Marland Kitchen ibuprofen (ADVIL,MOTRIN) 600 MG tablet Take 1 tablet (600 mg total) by mouth 3 (three) times daily. TAKE WITH FOOD 90 tablet 1  . losartan (COZAAR) 100 MG tablet Take 1 tablet (100 mg total) by mouth daily. 90 tablet 3  . lovastatin (MEVACOR) 20 MG tablet Take 1 tablet (20 mg total) by mouth at bedtime. 90 tablet 3  . metoprolol succinate (TOPROL-XL) 50 MG 24 hr tablet Take 1 tablet (50 mg total) by mouth daily. Take with or immediately following a meal. 90 tablet 3  . sildenafil (VIAGRA) 100 MG tablet Take 1 tablet (100 mg total) by mouth daily as needed for erectile dysfunction. 30 tablet 3  . cyclobenzaprine (FLEXERIL) 10 MG tablet TAKE 1 TABLET BY MOUTH AT BEDTIME. (Patient not taking: Reported on 02/21/2018) 30 tablet 0  . HYDROcodone-acetaminophen (NORCO/VICODIN) 5-325 MG tablet Take 1  tablet by mouth 3 (three) times daily as needed for moderate pain. (Patient not taking: Reported on 02/21/2018) 90 tablet 0   No facility-administered medications prior to visit.      ROS Review of Systems  Constitutional: Negative for chills, fever and malaise/fatigue.  Eyes: Negative for blurred vision.  Respiratory: Positive for shortness of breath.   Cardiovascular: Positive for leg swelling. Negative for chest pain, palpitations, orthopnea and PND.  Gastrointestinal: Positive for constipation. Negative for abdominal pain and nausea.  Genitourinary: Negative for dysuria and hematuria.  Musculoskeletal: Negative for joint pain and myalgias.  Skin: Negative for rash.  Neurological: Positive for dizziness. Negative for tingling and headaches.  Psychiatric/Behavioral: Negative for depression. The patient is not nervous/anxious.     Objective:  BP 131/74 (BP Location: Left Arm, Patient Position: Sitting, Cuff Size: Large)   Pulse 77   Temp (!) 97.5 F (36.4 C) (Oral)   Ht 5' 7.5" (1.715 m)   Wt 285 lb 9.6 oz (129.5 kg)   SpO2 94%   BMI 44.07 kg/m   BP/Weight 02/21/2018 12/21/2017 12/01/2017  Systolic BP 131 99 120  Diastolic BP 74 58 77  Wt. (Lbs) 285.6 270.4 -  BMI 44.07 43.64 -      Physical Exam  Constitutional: He is oriented to person, place, and time.  Well developed, obese, NAD, polite  HENT:  Head: Normocephalic and atraumatic.  Eyes: No scleral icterus.  Neck: Normal range of motion. Neck supple. No thyromegaly present.  Cardiovascular: Normal rate, regular rhythm and normal heart sounds.  2+ pitting edema of the lower extremity bilaterally  Pulmonary/Chest: Effort normal and breath sounds normal.  Musculoskeletal: He exhibits no edema.  Neurological: He is alert and oriented to person, place, and time.  Skin: Skin is warm and dry. No rash noted. No erythema. No pallor.  Psychiatric: He has a normal mood and affect. His behavior is normal. Thought content  normal.  Vitals reviewed.    Assessment & Plan:    1. Exertional dyspnea - Ambulatory referral to Cardiology - Brain natriuretic peptide - Basic Metabolic Panel  2. Peripheral edema - Compression stockings - Advised to reduce salt intake and continue using his Lasix.  - Ambulatory referral to Cardiology  3. Type 2 diabetes mellitus without complication, without long-term current use of insulin (HCC) - HgB A1c 6.9% - Begin metFORMIN (GLUCOPHAGE XR) 500 MG 24 hr tablet; Take 1 tablet (500 mg total) by mouth daily with breakfast.  Dispense: 30 tablet; Refill: 5  4. Constipation, unspecified constipation type - Begin metFORMIN (GLUCOPHAGE XR) 500 MG 24 hr tablet; Take 1 tablet (500 mg total) by mouth daily with breakfast.  Dispense: 30 tablet; Refill: 5    Meds ordered this encounter  Medications  . metFORMIN (GLUCOPHAGE XR) 500 MG 24 hr tablet    Sig: Take 1 tablet (500 mg total) by mouth daily with breakfast.    Dispense:  30 tablet    Refill:  5    Order Specific Question:   Supervising Provider    Answer:   Hoy RegisterNEWLIN, ENOBONG [[id number]]    Follow-up: Return in about 3 months (around 05/24/2018) for DM2.   Loletta Specteroger David Davene Jobin PA

## 2018-02-21 NOTE — Patient Instructions (Signed)
Shortness of Breath, Adult  Shortness of breath means you have trouble breathing. Your lungs are organs for breathing.  Follow these instructions at home:  Pay attention to any changes in your symptoms. Take these actions to help with your condition:  ? Do not smoke. Smoking can cause shortness of breath. If you need help to quit smoking, ask your doctor.  ? Avoid things that can make it harder to breathe, such as:  ? Mold.  ? Dust.  ? Air pollution.  ? Chemical smells.  ? Things that can cause allergy symptoms (allergens), if you have allergies.  ? Keep your living space clean and free of mold and dust.  ? Rest as needed. Slowly return to your usual activities.  ? Take over-the-counter and prescription medicines, including oxygen and inhaled medicines, only as told by your doctor.  ? Keep all follow-up visits as told by your doctor. This is important.  Contact a doctor if:  ? Your condition does not get better as soon as expected.  ? You have a hard time doing your normal activities, even after you rest.  ? You have new symptoms.  Get help right away if:  ? You have trouble breathing when you are resting.  ? You feel light-headed or you faint.  ? You have a cough that is not helped by medicines.  ? You cough up blood.  ? You have pain with breathing.  ? You have pain in your chest, arms, shoulders, or belly (abdomen).  ? You have a fever.  ? You cannot walk up stairs.  ? You cannot exercise the way you normally do.  This information is not intended to replace advice given to you by your health care provider. Make sure you discuss any questions you have with your health care provider.  Document Released: 02/15/2008 Document Revised: 09/15/2016 Document Reviewed: 09/15/2016  Elsevier Interactive Patient Education ? 2017 Elsevier Inc.

## 2018-02-22 ENCOUNTER — Telehealth: Payer: Self-pay

## 2018-02-22 LAB — BASIC METABOLIC PANEL
BUN/Creatinine Ratio: 14 (ref 10–24)
BUN: 10 mg/dL (ref 8–27)
CALCIUM: 9.5 mg/dL (ref 8.6–10.2)
CO2: 25 mmol/L (ref 20–29)
CREATININE: 0.73 mg/dL — AB (ref 0.76–1.27)
Chloride: 94 mmol/L — ABNORMAL LOW (ref 96–106)
GFR, EST AFRICAN AMERICAN: 117 mL/min/{1.73_m2} (ref >59)
GFR, EST NON AFRICAN AMERICAN: 101 mL/min/{1.73_m2} (ref >59)
Glucose: 143 mg/dL — ABNORMAL HIGH (ref 65–99)
Potassium: 4.3 mmol/L (ref 3.5–5.2)
Sodium: 137 mmol/L (ref 134–144)

## 2018-02-22 LAB — BRAIN NATRIURETIC PEPTIDE: BNP: 9.6 pg/mL (ref 0.0–100.0)

## 2018-02-22 NOTE — Telephone Encounter (Signed)
CMA spoke to patient to inform that CPAP is cancel for tomorrow and to come on 02/27/2018 at 10:30 for his CPAP appt.  Patient understood.

## 2018-02-23 ENCOUNTER — Telehealth (INDEPENDENT_AMBULATORY_CARE_PROVIDER_SITE_OTHER): Payer: Self-pay

## 2018-02-23 ENCOUNTER — Other Ambulatory Visit (INDEPENDENT_AMBULATORY_CARE_PROVIDER_SITE_OTHER): Payer: Self-pay | Admitting: Physician Assistant

## 2018-02-23 DIAGNOSIS — M7502 Adhesive capsulitis of left shoulder: Secondary | ICD-10-CM

## 2018-02-23 MED ORDER — IBUPROFEN 600 MG PO TABS: 600.0000 mg | ORAL_TABLET | Freq: Three times a day (TID) | ORAL | 1 refills | Status: DC | PRN

## 2018-02-23 MED FILL — CYCLOBENZAPRINE 10 MG TAB: 10 | 30 days supply | Qty: 30 | Fill #0

## 2018-02-23 MED FILL — IBUPROFEN 600 MG TABLET: 600 | 10 days supply | Qty: 30 | Fill #0

## 2018-02-23 NOTE — Telephone Encounter (Signed)
-----   Message from Loletta Specteroger David Gomez, PA-C sent at 02/22/2018  6:19 PM EDT ----- No congestive heart failure. Rest of labs unremarkable.

## 2018-02-23 NOTE — Telephone Encounter (Signed)
Patient aware. Thomas Medina, CMA  

## 2018-02-23 NOTE — Telephone Encounter (Signed)
Patient is aware that he does not have congestive heart failure and the other labs were normal. Patient is asking for a referral to pain management. And a refill on ibuprofen. Please advise. [name], CMA

## 2018-02-23 NOTE — Telephone Encounter (Signed)
I have sent referral to Pain clinic and a refill of ibuprofen 600 mg TID.

## 2018-02-26 ENCOUNTER — Telehealth: Payer: Self-pay

## 2018-02-26 NOTE — Telephone Encounter (Signed)
Called patient and reminded him of his CPAP teaching tomorrow-02/27/18 @ 1030 @ [facility].  He stated that he understood

## 2018-02-27 ENCOUNTER — Telehealth: Payer: Self-pay

## 2018-02-27 NOTE — Telephone Encounter (Signed)
The patient met with Phillis Knack, Respiratory therapist, [facility] hospital and completed CPAP teaching. He was instructed regarding the use and care of the machine  He had no further questions at the end of the teaching session and was given the machine.

## 2018-03-01 MED FILL — $VIAGRA 100 MG TABLET: 100 | 30 days supply | Qty: 10 | Fill #7

## 2018-03-01 MED FILL — LOVASTATIN 20 MG TABLET: 20 | 30 days supply | Qty: 30 | Fill #9

## 2018-03-01 MED FILL — AMLODIPINE BESYLATE 10 MG T: 10 | 30 days supply | Qty: 30 | Fill #9

## 2018-03-01 MED FILL — IBUPROFEN 600 MG TABLET: 600 | 30 days supply | Qty: 90 | Fill #1

## 2018-03-08 ENCOUNTER — Telehealth (INDEPENDENT_AMBULATORY_CARE_PROVIDER_SITE_OTHER): Payer: Self-pay | Admitting: Physician Assistant

## 2018-03-08 NOTE — Telephone Encounter (Signed)
Patient called requesting referral for physical therapist and was told that he would have to be seen and evaluated by PCP in order to get the referral in place. Patient was verbally understanding and made in appointment for  04-03-18  Please Follow Up [phone]  Thank You

## 2018-03-19 ENCOUNTER — Ambulatory Visit (INDEPENDENT_AMBULATORY_CARE_PROVIDER_SITE_OTHER): Payer: Self-pay | Admitting: Physician Assistant

## 2018-03-22 ENCOUNTER — Encounter (HOSPITAL_COMMUNITY): Payer: Self-pay | Admitting: Emergency Medicine

## 2018-03-22 ENCOUNTER — Telehealth (INDEPENDENT_AMBULATORY_CARE_PROVIDER_SITE_OTHER): Payer: Self-pay | Admitting: Physician Assistant

## 2018-03-22 ENCOUNTER — Ambulatory Visit (HOSPITAL_COMMUNITY)
Admission: EM | Admit: 2018-03-22 | Discharge: 2018-03-22 | Disposition: A | Discharge status: Home / Self Care | Payer: Medicaid Other | Attending: Family Medicine | Admitting: Family Medicine

## 2018-03-22 DIAGNOSIS — R600 Localized edema: Secondary | ICD-10-CM | POA: Diagnosis not present

## 2018-03-22 DIAGNOSIS — M545 Low back pain, unspecified: Secondary | ICD-10-CM

## 2018-03-22 LAB — POCT URINALYSIS DIP (DEVICE)
Bilirubin Urine: NEGATIVE
Glucose, UA: NEGATIVE mg/dL
HGB URINE DIPSTICK: NEGATIVE
KETONES UR: NEGATIVE mg/dL
LEUKOCYTES UA: NEGATIVE
Nitrite: NEGATIVE
Protein, ur: NEGATIVE mg/dL
SPECIFIC GRAVITY, URINE: 1.025 (ref 1.005–1.030)
UROBILINOGEN UA: 0.2 mg/dL (ref 0.0–1.0)
pH: 6 (ref 5.0–8.0)

## 2018-03-22 LAB — POCT I-STAT, CHEM 8
BUN: 22 mg/dL — AB (ref 6–20)
CREATININE: 0.9 mg/dL (ref 0.61–1.24)
Calcium, Ion: 1.12 mmol/L — ABNORMAL LOW (ref 1.15–1.40)
Chloride: 98 mmol/L (ref 98–111)
GLUCOSE: 119 mg/dL — AB (ref 70–99)
HCT: 43 % (ref 39.0–52.0)
HEMOGLOBIN: 14.6 g/dL (ref 13.0–17.0)
POTASSIUM: 4.2 mmol/L (ref 3.5–5.1)
Sodium: 139 mmol/L (ref 135–145)
TCO2: 30 mmol/L (ref 22–32)

## 2018-03-22 MED ORDER — TRAMADOL HCL 50 MG PO TABS: 50.0000 mg | ORAL_TABLET | Freq: Four times a day (QID) | ORAL | 0 refills | Status: DC | PRN

## 2018-03-22 MED ORDER — PREDNISONE 10 MG (21) PO TBPK: ORAL_TABLET | Freq: Every day | ORAL | 0 refills | Status: DC

## 2018-03-22 MED ORDER — FUROSEMIDE 40 MG PO TABS: 40.0000 mg | ORAL_TABLET | Freq: Every day | ORAL | 0 refills | Status: DC

## 2018-03-22 NOTE — ED Notes (Signed)
Pt discharged by provider.

## 2018-03-22 NOTE — Telephone Encounter (Signed)
Patient called to inform that his hands and feet are very swollen. Patient states that he has really bad lower back pain and also on his shoulders. Patients states that his physical therapist is requesting another referral for him to continue with his therapies. Patient has an appointment scheduled on 04-03-18 @ 9:50 am  Please Advices [phone]  Thank You Thomas Medina

## 2018-03-22 NOTE — Discharge Instructions (Addendum)
Stop taking amlodipine. This may be causing your swelling.  Be aware, pain medications may cause drowsiness.  Swelling happens when fluid collects in small spaces around tissues and organs inside the body. Another word for swelling is "edema." Some common parts of the body where people can have swelling are the lower legs or hands. This typically is worse in the areas of the body that are closest to the ground (because of gravity)  Symptoms of swelling can include puffiness of the skin, which can cause the skin to look stretched and shiny. This often occurs with swelling in the lower legs and can be worse after you sit or stand for a long time.  Treatment of edema includes several components: treatment of the underlying cause (if possible), reducing the amount of salt (sodium) in your diet, and, in many cases, use of a medication called a diuretic to eliminate excess fluid. Using compression stockings and elevating the legs may also be recommended.

## 2018-03-22 NOTE — ED Triage Notes (Signed)
Pt c/o back pain, shoulder pain, bilateral arms and legs swelling.

## 2018-03-23 NOTE — Telephone Encounter (Signed)
Patient states he went to [facility] last night and was taken off of amlodipine. And prescribed prednisone and lasix. Was told to discuss medications with PCP. [name], CMA

## 2018-03-26 NOTE — Telephone Encounter (Signed)
That is fine. Keep appt.

## 2018-03-30 ENCOUNTER — Other Ambulatory Visit (INDEPENDENT_AMBULATORY_CARE_PROVIDER_SITE_OTHER): Payer: Self-pay | Admitting: Physician Assistant

## 2018-03-30 DIAGNOSIS — I10 Essential (primary) hypertension: Secondary | ICD-10-CM

## 2018-03-30 MED FILL — METOPROLOL SUCCINATE ER 50: 50 | 30 days supply | Qty: 90 | Fill #0

## 2018-03-30 MED FILL — LOSARTAN POTASSIUM 100 MG T: 100 | 30 days supply | Qty: 30 | Fill #0

## 2018-03-30 MED FILL — METFORMIN HCL ER 500 MG TAB: 500 | 30 days supply | Qty: 30 | Fill #1

## 2018-03-30 MED FILL — $VIAGRA 100 MG TABLET: 100 | 30 days supply | Qty: 10 | Fill #8

## 2018-03-30 MED FILL — FUROSEMIDE 40 MG TAB: 40 | 30 days supply | Qty: 30 | Fill #5

## 2018-03-30 NOTE — Telephone Encounter (Signed)
FWD to PCP. Thomas Medina S Kinga Cassar, CMA  

## 2018-04-03 ENCOUNTER — Encounter (INDEPENDENT_AMBULATORY_CARE_PROVIDER_SITE_OTHER): Payer: Self-pay | Admitting: Physician Assistant

## 2018-04-03 ENCOUNTER — Ambulatory Visit (INDEPENDENT_AMBULATORY_CARE_PROVIDER_SITE_OTHER): Payer: Medicaid Other | Admitting: Physician Assistant

## 2018-04-03 ENCOUNTER — Other Ambulatory Visit: Payer: Self-pay

## 2018-04-03 VITALS — BP 135/74 | HR 77 | Temp 98.2°F | Ht 67.5 in | Wt 290.4 lb

## 2018-04-03 DIAGNOSIS — M4310 Spondylolisthesis, site unspecified: Secondary | ICD-10-CM | POA: Diagnosis not present

## 2018-04-03 DIAGNOSIS — G8929 Other chronic pain: Secondary | ICD-10-CM

## 2018-04-03 DIAGNOSIS — I10 Essential (primary) hypertension: Secondary | ICD-10-CM | POA: Diagnosis not present

## 2018-04-03 DIAGNOSIS — M7501 Adhesive capsulitis of right shoulder: Secondary | ICD-10-CM | POA: Diagnosis not present

## 2018-04-03 DIAGNOSIS — E785 Hyperlipidemia, unspecified: Secondary | ICD-10-CM

## 2018-04-03 DIAGNOSIS — Z760 Encounter for issue of repeat prescription: Secondary | ICD-10-CM

## 2018-04-03 DIAGNOSIS — M7502 Adhesive capsulitis of left shoulder: Secondary | ICD-10-CM

## 2018-04-03 DIAGNOSIS — M5441 Lumbago with sciatica, right side: Secondary | ICD-10-CM

## 2018-04-03 DIAGNOSIS — M5442 Lumbago with sciatica, left side: Secondary | ICD-10-CM

## 2018-04-03 MED ORDER — SILDENAFIL CITRATE 100 MG PO TABS: 100.0000 mg | ORAL_TABLET | Freq: Every day | ORAL | 11 refills | Status: DC | PRN

## 2018-04-03 MED ORDER — LOSARTAN POTASSIUM 100 MG PO TABS: 100.0000 mg | ORAL_TABLET | Freq: Every day | ORAL | 11 refills | Status: DC

## 2018-04-03 MED ORDER — DULOXETINE HCL 20 MG PO CPEP: 40.0000 mg | ORAL_CAPSULE | Freq: Every day | ORAL | 11 refills | Status: DC

## 2018-04-03 MED ORDER — LOVASTATIN 20 MG PO TABS: 20.0000 mg | ORAL_TABLET | Freq: Every day | ORAL | 11 refills | Status: DC

## 2018-04-03 MED ORDER — METOPROLOL SUCCINATE ER 50 MG PO TB24: ORAL_TABLET | ORAL | 11 refills | Status: DC

## 2018-04-03 MED ORDER — FUROSEMIDE 40 MG PO TABS: 40.0000 mg | ORAL_TABLET | Freq: Every day | ORAL | 5 refills | Status: DC | PRN

## 2018-04-03 MED ORDER — ACETAMINOPHEN-CODEINE #3 300-30 MG PO TABS: 2.0000 | ORAL_TABLET | Freq: Three times a day (TID) | ORAL | 0 refills | Status: AC | PRN

## 2018-04-03 MED ORDER — METFORMIN HCL ER 500 MG PO TB24: 500.0000 mg | ORAL_TABLET | Freq: Every day | ORAL | 11 refills | Status: DC

## 2018-04-03 MED ORDER — IBUPROFEN 600 MG PO TABS: 600.0000 mg | ORAL_TABLET | Freq: Three times a day (TID) | ORAL | 1 refills | Status: DC | PRN

## 2018-04-03 MED FILL — LOVASTATIN 20 MG TABLET: 20 | 30 days supply | Qty: 30 | Fill #0

## 2018-04-03 MED FILL — DULoxetine HCL 20 MG CPEP: 20 | 30 days supply | Qty: 60 | Fill #0

## 2018-04-03 MED FILL — IBUPROFEN 600 MG TABLET: 600 | 10 days supply | Qty: 30 | Fill #0

## 2018-04-03 NOTE — Progress Notes (Signed)
Subjective:  Patient ID: Thomas Medina, male    DOB: 1955-11-03  Age: 62 y.o. MRN: [id number]  CC: requesting referral for PT  HPI Thomas Medina a 62 y.o.malewith a medical history ofOSA, HTN, HLD, andbilateral adhesive capsulitis presents with bilateral shoulder pain and LBP. Requests referral to physical therapy. Went to orthopedic specialist on 02/19/18 and was noted to be doing better in regards to pain and range of motion of the shoulders. Advised to continue on home exercise program. Pt believes his range of motion is somewhat better but believes the shoulder pain is worsening and would like to be referred to another orthopedic practice. Says the orthopedic specialist completely ignores his complaints of lower back pain. Pt is having difficulty with his ADLs but also acknowledges his central obesity is contributing to reduced functions such as dressing. Would like to be more active but his shoulder and back pain preclude him from activities. Pt is also going to pain management clinic but missed one appointment and was being charged $50 dollars to be seen again. Last seen on 12/01/17. Was referred to the same pain clinic a second time but patient has not heard from them again. Would like to be referred to a pain clinic outside of [facility].     Pt is confused as to what medications he should be taking. Would like a list of the medications he should be taking.      Outpatient Medications Prior to Visit  Medication Sig Dispense Refill  . DULoxetine (CYMBALTA) 20 MG capsule Take 40 mg by mouth daily.    . furosemide (LASIX) 40 MG tablet Take 1 tablet (40 mg total) by mouth daily. 5 tablet 0  . ibuprofen (ADVIL,MOTRIN) 600 MG tablet Take 1 tablet (600 mg total) by mouth every 8 (eight) hours as needed. 30 tablet 1  . losartan (COZAAR) 100 MG tablet TAKE 1 TABLET BY MOUTH DAILY. 90 tablet 3  . lovastatin (MEVACOR) 20 MG tablet Take 1 tablet (20 mg total) by mouth at bedtime. 90 tablet 3   . metFORMIN (GLUCOPHAGE XR) 500 MG 24 hr tablet Take 1 tablet (500 mg total) by mouth daily with breakfast. 30 tablet 5  . metoprolol succinate (TOPROL-XL) 50 MG 24 hr tablet TAKE 1 TABLET BY MOUTH DAILY. TAKE WITH OR IMMEDIATELY FOLLOWING A MEAL. 90 tablet 3  . sildenafil (VIAGRA) 100 MG tablet Take 1 tablet (100 mg total) by mouth daily as needed for erectile dysfunction. 30 tablet 3  . cloNIDine (CATAPRES) 0.1 MG tablet Take 1 tablet (0.1 mg total) by mouth 2 (two) times daily. (Patient not taking: Reported on 04/03/2018) 180 tablet 1  . hydrOXYzine (ATARAX/VISTARIL) 50 MG tablet Take 50 mg by mouth at bedtime.    . cyclobenzaprine (FLEXERIL) 10 MG tablet TAKE 1 TABLET BY MOUTH AT BEDTIME. (Patient not taking: Reported on 02/21/2018) 30 tablet 0  . HYDROcodone-acetaminophen (NORCO/VICODIN) 5-325 MG tablet Take 1 tablet by mouth 3 (three) times daily as needed for moderate pain. (Patient not taking: Reported on 02/21/2018) 90 tablet 0  . predniSONE (STERAPRED UNI-PAK 21 TAB) 10 MG (21) TBPK tablet Take by mouth daily. Take as directed. 21 tablet 0  . traMADol (ULTRAM) 50 MG tablet Take 1 tablet (50 mg total) by mouth every 6 (six) hours as needed. 12 tablet 0   No facility-administered medications prior to visit.      ROS Review of Systems  Constitutional: Negative for chills, fever and malaise/fatigue.  Eyes: Negative for blurred vision.  Respiratory: Negative for shortness of breath.   Cardiovascular: Negative for chest pain and palpitations.  Gastrointestinal: Negative for abdominal pain and nausea.  Genitourinary: Negative for dysuria and hematuria.  Musculoskeletal: Positive for back pain and joint pain. Negative for myalgias.  Skin: Negative for rash.  Neurological: Negative for tingling and headaches.  Psychiatric/Behavioral: Negative for depression. The patient is not nervous/anxious.     Objective:  BP 135/74 (BP Location: Left Arm, Patient Position: Sitting, Cuff Size: Large)    Pulse 77   Temp 98.2 F (36.8 C) (Oral)   Ht 5' 7.5" (1.715 m)   Wt 290 lb 6.4 oz (131.7 kg)   SpO2 93%   BMI 44.81 kg/m   BP/Weight 04/03/2018 03/22/2018 02/21/2018  Systolic BP 135 146 131  Diastolic BP 74 61 74  Wt. (Lbs) 290.4 - 285.6  BMI 44.81 - 44.07      Physical Exam  Constitutional: He is oriented to person, place, and time.  Well developed, obese, NAD, polite  HENT:  Head: Normocephalic and atraumatic.  Eyes: No scleral icterus.  Neck: Normal range of motion. Neck supple. No thyromegaly present.  Cardiovascular: Normal rate, regular rhythm and normal heart sounds. Exam reveals no friction rub.  No murmur heard. Pulmonary/Chest: Effort normal and breath sounds normal.  Musculoskeletal: He exhibits no edema.  Neurological: He is alert and oriented to person, place, and time.  Skin: Skin is warm and dry. No rash noted. No erythema. No pallor.  Psychiatric: His behavior is normal. Thought content normal.  frustrated  Vitals reviewed.    Assessment & Plan:     1. Adhesive capsulitis of both shoulders - AMB referral to orthopedics - Ambulatory referral to Pain Clinic - Begin acetaminophen-codeine (TYLENOL #3) 300-30 MG tablet; Take 2 tablets by mouth every 8 (eight) hours as needed for up to 7 days for moderate pain.  Dispense: 42 tablet; Refill: 0. Pt fully aware he will not be receiving any more opioid pain killers from me and he will need to obtain pain relief with the pain clinic.   2. Chronic bilateral low back pain with bilateral sciatica - AMB referral to orthopedics - Ambulatory referral to Pain Clinic - acetaminophen-codeine (TYLENOL #3) 300-30 MG tablet; Take 2 tablets by mouth every 8 (eight) hours as needed for up to 7 days for moderate pain.  Dispense: 42 tablet; Refill: 0. Pt fully aware he will not be receiving any more opioid pain killers from me and he will need to obtain pain relief with the pain clinic.   3. Anterolisthesis - AMB referral to  orthopedics - Ambulatory referral to Pain Clinic  4. Hypertension, unspecified type - metoprolol succinate (TOPROL-XL) 50 MG 24 hr tablet; TAKE 1 TABLET BY MOUTH DAILY. TAKE WITH OR IMMEDIATELY FOLLOWING A MEAL.  Dispense: 30 tablet; Refill: 11 - losartan (COZAAR) 100 MG tablet; Take 1 tablet (100 mg total) by mouth daily.  Dispense: 30 tablet; Refill: 11 - furosemide (LASIX) 40 MG tablet; Take 1 tablet (40 mg total) by mouth daily as needed.  Dispense: 30 tablet; Refill: 5 - Comprehensive metabolic panel  5. Hyperlipidemia, unspecified hyperlipidemia type - lovastatin (MEVACOR) 20 MG tablet; Take 1 tablet (20 mg total) by mouth at bedtime.  Dispense: 30 tablet; Refill: 11 - Lipid panel  6. Medication refill - sildenafil (VIAGRA) 100 MG tablet; Take 1 tablet (100 mg total) by mouth daily as needed for erectile dysfunction.  Dispense: 10 tablet; Refill: 11 - metFORMIN (GLUCOPHAGE XR) 500 MG 24  hr tablet; Take 1 tablet (500 mg total) by mouth daily with breakfast.  Dispense: 30 tablet; Refill: 11 - ibuprofen (ADVIL,MOTRIN) 600 MG tablet; Take 1 tablet (600 mg total) by mouth every 8 (eight) hours as needed.  Dispense: 30 tablet; Refill: 1 - DULoxetine (CYMBALTA) 20 MG capsule; Take 2 capsules (40 mg total) by mouth daily.  Dispense: 60 capsule; Refill: 11  *I have personally explained each medication on his printed medication list and how to take the medication. *Greater than 45 minutes spent in this encounter   Meds ordered this encounter  Medications  . sildenafil (VIAGRA) 100 MG tablet    Sig: Take 1 tablet (100 mg total) by mouth daily as needed for erectile dysfunction.    Dispense:  10 tablet    Refill:  11    Order Specific Question:   Supervising Provider    Answer:   Hoy RegisterNEWLIN, ENOBONG [[id number]]  . metoprolol succinate (TOPROL-XL) 50 MG 24 hr tablet    Sig: TAKE 1 TABLET BY MOUTH DAILY. TAKE WITH OR IMMEDIATELY FOLLOWING A MEAL.    Dispense:  30 tablet    Refill:  11    Order  Specific Question:   Supervising Provider    Answer:   Hoy RegisterNEWLIN, ENOBONG [[id number]]  . metFORMIN (GLUCOPHAGE XR) 500 MG 24 hr tablet    Sig: Take 1 tablet (500 mg total) by mouth daily with breakfast.    Dispense:  30 tablet    Refill:  11    Order Specific Question:   Supervising Provider    Answer:   Hoy RegisterNEWLIN, ENOBONG [[id number]]  . lovastatin (MEVACOR) 20 MG tablet    Sig: Take 1 tablet (20 mg total) by mouth at bedtime.    Dispense:  30 tablet    Refill:  11    Order Specific Question:   Supervising Provider    Answer:   Hoy RegisterNEWLIN, ENOBONG [[id number]]  . losartan (COZAAR) 100 MG tablet    Sig: Take 1 tablet (100 mg total) by mouth daily.    Dispense:  30 tablet    Refill:  11    Order Specific Question:   Supervising Provider    Answer:   Hoy RegisterNEWLIN, ENOBONG [[id number]]  . ibuprofen (ADVIL,MOTRIN) 600 MG tablet    Sig: Take 1 tablet (600 mg total) by mouth every 8 (eight) hours as needed.    Dispense:  30 tablet    Refill:  1    Order Specific Question:   Supervising Provider    Answer:   Hoy RegisterNEWLIN, ENOBONG [[id number]]  . furosemide (LASIX) 40 MG tablet    Sig: Take 1 tablet (40 mg total) by mouth daily as needed.    Dispense:  30 tablet    Refill:  5    Order Specific Question:   Supervising Provider    Answer:   Hoy RegisterNEWLIN, ENOBONG [[id number]]  . DULoxetine (CYMBALTA) 20 MG capsule    Sig: Take 2 capsules (40 mg total) by mouth daily.    Dispense:  60 capsule    Refill:  11    Order Specific Question:   Supervising Provider    Answer:   Hoy RegisterNEWLIN, ENOBONG [[id number]]  . acetaminophen-codeine (TYLENOL #3) 300-30 MG tablet    Sig: Take 2 tablets by mouth every 8 (eight) hours as needed for up to 7 days for moderate pain.    Dispense:  42 tablet    Refill:  0    Order Specific Question:   Supervising Provider    Answer:  NEWLIN, ENOBONG [[id number]]    Follow-up: 6 weeks for HTN and pain  Loletta Specter PA

## 2018-04-03 NOTE — Patient Instructions (Signed)

## 2018-04-04 ENCOUNTER — Other Ambulatory Visit (INDEPENDENT_AMBULATORY_CARE_PROVIDER_SITE_OTHER): Payer: Self-pay | Admitting: Physician Assistant

## 2018-04-04 ENCOUNTER — Telehealth (INDEPENDENT_AMBULATORY_CARE_PROVIDER_SITE_OTHER): Payer: Self-pay

## 2018-04-04 DIAGNOSIS — E781 Pure hyperglyceridemia: Secondary | ICD-10-CM

## 2018-04-04 LAB — LIPID PANEL
Chol/HDL Ratio: 5.3 ratio — ABNORMAL HIGH (ref 0.0–5.0)
Cholesterol, Total: 168 mg/dL (ref 100–199)
HDL: 32 mg/dL — ABNORMAL LOW (ref >39)
LDL Calculated: 92 mg/dL (ref 0–99)
Triglycerides: 218 mg/dL — ABNORMAL HIGH (ref 0–149)
VLDL CHOLESTEROL CAL: 44 mg/dL — AB (ref 5–40)

## 2018-04-04 LAB — COMPREHENSIVE METABOLIC PANEL
ALK PHOS: 97 IU/L (ref 39–117)
ALT: 68 IU/L — ABNORMAL HIGH (ref 0–44)
AST: 31 IU/L (ref 0–40)
Albumin/Globulin Ratio: 1.6 (ref 1.2–2.2)
Albumin: 4.3 g/dL (ref 3.6–4.8)
BUN/Creatinine Ratio: 13 (ref 10–24)
BUN: 11 mg/dL (ref 8–27)
Bilirubin Total: 0.3 mg/dL (ref 0.0–1.2)
CO2: 25 mmol/L (ref 20–29)
Calcium: 9.2 mg/dL (ref 8.6–10.2)
Chloride: 100 mmol/L (ref 96–106)
Creatinine, Ser: 0.85 mg/dL (ref 0.76–1.27)
GFR calc Af Amer: 109 mL/min/{1.73_m2} (ref >59)
GFR calc non Af Amer: 95 mL/min/{1.73_m2} (ref >59)
Globulin, Total: 2.7 g/dL (ref 1.5–4.5)
Glucose: 136 mg/dL — ABNORMAL HIGH (ref 65–99)
Potassium: 4.9 mmol/L (ref 3.5–5.2)
Sodium: 140 mmol/L (ref 134–144)
TOTAL PROTEIN: 7 g/dL (ref 6.0–8.5)

## 2018-04-04 MED ORDER — OMEGA-3-ACID ETHYL ESTERS 1 G PO CAPS: 2.0000 g | ORAL_CAPSULE | Freq: Two times a day (BID) | ORAL | 5 refills | Status: DC

## 2018-04-04 NOTE — Telephone Encounter (Signed)
Patient called office for something unrelated to results but while speaking with patient he was made aware of elevated triglycerides and [facility]. Advised to eat less fried/fatty foods. Also aware of mild liver inflammation suspected to be due to fatty liver. [name], CMA

## 2018-04-04 NOTE — Telephone Encounter (Signed)
-----   Message from Loletta Specteroger David Gomez, PA-C sent at 04/04/2018  8:31 AM EDT ----- Triglycerides elevated and mild liver inflammation, I suspect due to fatty liver. Eat less fatty/fried foods. I have sent Lovaza to [facility] pharmacy to lower triglycerides.

## 2018-04-04 NOTE — ED Provider Notes (Addendum)
[facility]   [id number] 03/22/18 Arrival Time: 1555  ASSESSMENT & PLAN:  1. Extremity edema   2. Acute bilateral low back pain without sciatica     Meds ordered this encounter  Medications  . predniSONE (STERAPRED UNI-PAK 21 TAB) 10 MG (21) TBPK tablet    Sig: Take by mouth daily. Take as directed.    Dispense:  21 tablet    Refill:  0  . furosemide (LASIX) 40 MG tablet    Sig: Take 1 tablet (40 mg total) by mouth daily.    Dispense:  5 tablet    Refill:  0  . traMADol (ULTRAM) 50 MG tablet    Sig: Take 1 tablet (50 mg total) by mouth every 6 (six) hours as needed.    Dispense:  12 tablet    Refill:  0   Encouraged to f/u with PCP should symptoms persist or worsen. Reviewed expectations re: course of current medical issues. Questions answered. Outlined signs and symptoms indicating need for more acute intervention. Patient verbalized understanding. After Visit Summary given.   SUBJECTIVE: History from: patient.  Thomas Medina is a 62 y.o. male who presents with complaint of intermittent bilateral lower back discomfort. Onset gradual beginning a few weeks ago. Injury/trama: no. History of back problems: yes, similar symptoms in the past dx as sciatica; 'feels the same'. Previous back surgery: none reported. Discomfort described as aching with radiation to lower legs; transient. Certain movements exacerbate the described discomfort. Better with rest. Extremity sensation changes or weakness: none. Ambulatory without difficulty. Normal bowel/bladder habits. No associated abdominal pain/n/v. Self treatment: tried OTCs without relief of pain. Has noticed some LE edema.  Patient reports no fevers, IV drug use, recent back surgeries or procedures, urinary incontinence, or bowel incontinence.  ROS: As per HPI. All other systems negative.   OBJECTIVE:  Vitals:   03/22/18 1625  BP: (!) 146/61  Pulse: 95  Resp: 16  Temp: 98.5 F (36.9 C)  SpO2: 100%    General  appearance: alert; no distress Neck: supple with FROM; without midline tenderness Lungs: unlabored respirations; symmetrical air entry Abdomen: soft, non-tender; bowel sounds normal; no masses or organomegaly; no guarding or rebound tenderness Back: vague bilateral lower tenderness reported over low back; FROM at hips with mild discomfort reported; bruising: lower; without midline tenderness Extremities: slight bilateral LE edema; symmetrical with no gross deformities Skin: warm and dry Neurologic: normal gait; normal symmetric reflexes; normal LE strength and sensation Psychological: alert and cooperative; normal mood and affect  Labs: Results for orders placed or performed during the hospital encounter of 03/22/18  POCT urinalysis dip (device)  Result Value Ref Range   Glucose, UA NEGATIVE NEGATIVE mg/dL   Bilirubin Urine NEGATIVE NEGATIVE   Ketones, ur NEGATIVE NEGATIVE mg/dL   Specific Gravity, Urine 1.025 1.005 - 1.030   Hgb urine dipstick NEGATIVE NEGATIVE   pH 6.0 5.0 - 8.0   Protein, ur NEGATIVE NEGATIVE mg/dL   Urobilinogen, UA 0.2 0.0 - 1.0 mg/dL   Nitrite NEGATIVE NEGATIVE   Leukocytes, UA NEGATIVE NEGATIVE  I-STAT, chem 8  Result Value Ref Range   Sodium 139 135 - 145 mmol/L   Potassium 4.2 3.5 - 5.1 mmol/L   Chloride 98 98 - 111 mmol/L   BUN 22 (H) 6 - 20 mg/dL   Creatinine, Ser 4.090.90 0.61 - 1.24 mg/dL   Glucose, Bld 811119 (H) 70 - 99 mg/dL   Calcium, Ion 9.141.12 (L) 1.15 - 1.40 mmol/L   TCO2  30 22 - 32 mmol/L   Hemoglobin 14.6 13.0 - 17.0 g/dL   HCT 16.1 09.6 - 04.5 %   Labs Reviewed  POCT I-STAT, CHEM 8 - Abnormal; Notable for the following components:      Result Value   BUN 22 (*)    Glucose, Bld 119 (*)    Calcium, Ion 1.12 (*)    All other components within normal limits  POCT URINALYSIS DIP (DEVICE)    No Known Allergies  Past Medical History:  Diagnosis Date  . Hypertension    Social History   Socioeconomic History  . Marital status: Divorced     Spouse name: Not on file  . Number of children: Not on file  . Years of education: Not on file  . Highest education level: Not on file  Occupational History  . Not on file  Social Needs  . Financial resource strain: Not on file  . Food insecurity:    Worry: Not on file    Inability: Not on file  . Transportation needs:    Medical: Not on file    Non-medical: Not on file  Tobacco Use  . Smoking status: Current Every Day Smoker    Packs/day: 0.25    Types: Cigarettes  . Smokeless tobacco: Never Used  Substance and Sexual Activity  . Alcohol use: Yes    Comment: occ  . Drug use: No  . Sexual activity: Not on file  Lifestyle  . Physical activity:    Days per week: Not on file    Minutes per session: Not on file  . Stress: Not on file  Relationships  . Social connections:    Talks on phone: Not on file    Gets together: Not on file    Attends religious service: Not on file    Active member of club or organization: Not on file    Attends meetings of clubs or organizations: Not on file    Relationship status: Not on file  . Intimate partner violence:    Fear of current or ex partner: Not on file    Emotionally abused: Not on file    Physically abused: Not on file    Forced sexual activity: Not on file  Other Topics Concern  . Not on file  Social History Narrative  . Not on file   Family History  Problem Relation Age of Onset  . Aneurysm Mother   . Heart attack Father   . Renal cancer Brother   . Kidney disease Brother    Past Surgical History:  Procedure Laterality Date  . POLYPECTOMY       Mardella Layman, MD 04/05/18 [id number]    Mardella Layman, MD 04/05/18 1002

## 2018-04-06 ENCOUNTER — Ambulatory Visit (INDEPENDENT_AMBULATORY_CARE_PROVIDER_SITE_OTHER): Payer: Medicaid Other | Admitting: Cardiology

## 2018-04-06 ENCOUNTER — Telehealth (INDEPENDENT_AMBULATORY_CARE_PROVIDER_SITE_OTHER): Payer: Self-pay | Admitting: Physician Assistant

## 2018-04-06 ENCOUNTER — Encounter: Payer: Self-pay | Admitting: Cardiology

## 2018-04-06 VITALS — BP 140/60 | HR 75 | Ht 69.0 in | Wt 291.0 lb

## 2018-04-06 DIAGNOSIS — Z716 Tobacco abuse counseling: Secondary | ICD-10-CM

## 2018-04-06 DIAGNOSIS — R609 Edema, unspecified: Secondary | ICD-10-CM | POA: Diagnosis not present

## 2018-04-06 DIAGNOSIS — I10 Essential (primary) hypertension: Secondary | ICD-10-CM | POA: Diagnosis not present

## 2018-04-06 DIAGNOSIS — E785 Hyperlipidemia, unspecified: Secondary | ICD-10-CM

## 2018-04-06 DIAGNOSIS — R0602 Shortness of breath: Secondary | ICD-10-CM

## 2018-04-06 NOTE — Telephone Encounter (Signed)
Left message informing patient to get OTC omega 3 maximum strength per PCP. Maryjean Mornempestt S Darlene Brozowski, CMA

## 2018-04-06 NOTE — Progress Notes (Signed)
Cardiology Office Note:    Date:  04/06/2018   ID:  Thomas Medina, DOB [birth year], MRN [id number]  PCP:  Loletta SpecterGomez, Roger David, PA-C  Cardiologist:  Jodelle RedBridgette Kasidi Shanker, MD PhD  Referring MD: Loletta SpecterGomez, Roger David, PA-C   Chief Complaint  Patient presents with  . Follow-up  . Edema  . Shortness of Breath    History of Present Illness:    Thomas Companionnthony Carswell is a 62 y.o. male with a hx of type II diabetes, hypertension, sleep apnea who is seen as a new consult at the request of Sindy MessingRoger Gomez for the evaluation of LE edema and dyspnea.  He has never been told he has heart issues before. Never had a heart cath or echo. First noticed in December of last year that his left leg and arm were swollen. Went for evaluation in ER, had fluid aspirated, told it wasn't gout Notes report inflammatory arthritis without gout or pseudogout. Since that time, he has had stable lower extremity edema, with his left leg typically more swollen than right leg. At one time he went to urgent care; he was prescribed furosemide for three days but he didn't notice any increase in urine output. LE edema remained unchanged.  Never had chest pain. Breathing is stable. No SOB at rest. Has DOE with going up and down stairs or walking long distances. Back stops him first, then breathing bothers him. Has been occurring for the last year. Better with resting, taking deep breaths. Nothing that makes it worse other than exertion. Sleeps on two pillows stably, can lie flat without issues. No PND. Has sleep apnea, uses CPAP. Does have a mild chronic cough, which is exacerbated with CPAP. Does sleep better when he tolerates CPAP. No palpitations, syncope.   Trying to cut back on smoking, down to 5 cigarettes a day. Occasionally goes the whole day without smoking. Discussed options to help support him. He is technically homeless but has friends that he stays with and a daughter nearby for social support. Has been denied twice for SSD, has been  trying to work with a Clinical research associatelawyer. Offered case Research officer, political partymanager/social worker. Has medicaid.    Has just started working on his diet in the last three days. Trying to eat more vegetables and whole grains, avoiding salt. Has had no meat, though he is not sure this will be sustainable for him in the long term. Has changed from canned foods to frozen. Interested in speaking to nutrition. Wants to lose weight. Limited in his exertional ability due to back pain and abdominal hernia.   Past Medical History:  Diagnosis Date  . Depression   . Hypertension   . PTSD (post-traumatic stress disorder)   . Sleep apnea   . Vision abnormalities     Past Surgical History:  Procedure Laterality Date  . POLYPECTOMY      Current Medications: Current Outpatient Medications on File Prior to Visit  Medication Sig  . acetaminophen-codeine (TYLENOL #3) 300-30 MG tablet Take 2 tablets by mouth every 8 (eight) hours as needed for up to 7 days for moderate pain.  . DULoxetine (CYMBALTA) 20 MG capsule Take 2 capsules (40 mg total) by mouth daily.  . furosemide (LASIX) 40 MG tablet Take 1 tablet (40 mg total) by mouth daily as needed.  . hydrOXYzine (ATARAX/VISTARIL) 50 MG tablet Take 50 mg by mouth at bedtime.  Marland Kitchen. ibuprofen (ADVIL,MOTRIN) 600 MG tablet Take 1 tablet (600 mg total) by mouth every 8 (eight) hours as needed.  Marland Kitchen. losartan (  COZAAR) 100 MG tablet Take 1 tablet (100 mg total) by mouth daily.  Marland Kitchen lovastatin (MEVACOR) 20 MG tablet Take 1 tablet (20 mg total) by mouth at bedtime.  . metFORMIN (GLUCOPHAGE XR) 500 MG 24 hr tablet Take 1 tablet (500 mg total) by mouth daily with breakfast.  . metoprolol succinate (TOPROL-XL) 50 MG 24 hr tablet TAKE 1 TABLET BY MOUTH DAILY. TAKE WITH OR IMMEDIATELY FOLLOWING A MEAL.  Marland Kitchen omega-3 acid ethyl esters (LOVAZA) 1 g capsule Take 2 capsules (2 g total) by mouth 2 (two) times daily.  . sildenafil (VIAGRA) 100 MG tablet Take 1 tablet (100 mg total) by mouth daily as needed for erectile  dysfunction.   No current facility-administered medications on file prior to visit.      Allergies:   Patient has no known allergies.   Social History   Socioeconomic History  . Marital status: Divorced    Spouse name: Not on file  . Number of children: Not on file  . Years of education: Not on file  . Highest education level: Not on file  Occupational History  . Not on file  Social Needs  . Financial resource strain: Not on file  . Food insecurity:    Worry: Not on file    Inability: Not on file  . Transportation needs:    Medical: Not on file    Non-medical: Not on file  Tobacco Use  . Smoking status: Current Every Day Smoker    Packs/day: 0.25    Types: Cigarettes  . Smokeless tobacco: Never Used  Substance and Sexual Activity  . Alcohol use: Yes    Comment: occ  . Drug use: No  . Sexual activity: Not on file  Lifestyle  . Physical activity:    Days per week: Not on file    Minutes per session: Not on file  . Stress: Not on file  Relationships  . Social connections:    Talks on phone: Not on file    Gets together: Not on file    Attends religious service: Not on file    Active member of club or organization: Not on file    Attends meetings of clubs or organizations: Not on file    Relationship status: Not on file  Other Topics Concern  . Not on file  Social History Narrative  . Not on file     Family History: The patient's family history includes Aneurysm in his mother; Heart attack in his father; Hyperlipidemia in his father; Hypertension in his father; Kidney disease in his brother; Renal cancer in his brother. Brother had bioprosthetic valve replacement. Father passed from heart attack at age 56. Both of his father's brothers passed away from MI.  ROS:   Please see the history of present illness.  Additional pertinent ROS: Review of Systems  Constitutional: Negative for chills, fever, malaise/fatigue and weight loss.  HENT: Negative for ear pain and  hearing loss.   Eyes: Negative for blurred vision and pain.  Respiratory: Positive for cough and shortness of breath. Negative for hemoptysis, sputum production and wheezing.   Cardiovascular: Positive for leg swelling. Negative for chest pain, palpitations, orthopnea, claudication and PND.  Gastrointestinal: Negative for abdominal pain, blood in stool, melena, nausea and vomiting.  Genitourinary: Negative for dysuria and hematuria.  Musculoskeletal: Positive for back pain and myalgias. Negative for falls.  Skin: Negative for itching and rash.  Neurological: Negative for focal weakness, loss of consciousness and headaches.  Endo/Heme/Allergies: Does not  bruise/bleed easily.   EKGs/Labs/Other Studies Reviewed:    The following studies were reviewed today: Prior history, lab work, progress notes.   EKG:  EKG is ordered today.  The ekg ordered today demonstrates normal sinus rhythm with nonspecific ST changes (slight PR depression and concave st changes)  Recent Labs: 04/18/2017: TSH 2.540 09/20/2017: Platelets 342 02/21/2018: BNP 9.6 03/22/2018: Hemoglobin 14.6 04/03/2018: ALT 68; BUN 11; Creatinine, Ser 0.85; Potassium 4.9; Sodium 140  Recent Lipid Panel    Component Value Date/Time   CHOL 168 04/03/2018 1132   TRIG 218 (H) 04/03/2018 1132   HDL 32 (L) 04/03/2018 1132   CHOLHDL 5.3 (H) 04/03/2018 1132   LDLCALC 92 04/03/2018 1132    Physical Exam:    VS:  BP 140/60 (BP Location: Left Arm, Patient Position: Sitting, Cuff Size: Large)   Pulse 75   Ht 5\' 9"  (1.753 m)   Wt 291 lb (132 kg)   BMI 42.97 kg/m     Wt Readings from Last 3 Encounters:  04/06/18 291 lb (132 kg)  04/03/18 290 lb 6.4 oz (131.7 kg)  02/21/18 285 lb 9.6 oz (129.5 kg)    GEN: Well nourished, well developed in no acute distress HEENT: Normal NECK: No JVD; No carotid bruits LYMPHATICS: No lymphadenopathy CARDIAC: regular rhythm, normal S1 and S2, no murmurs, rubs, gallops. Radial and DP pulses 2+  bilaterally. RESPIRATORY:  Clear to auscultation without rales, wheezing or rhonchi  ABDOMEN: Soft, non-tender, non-distended. Abdominal hernia. MUSCULOSKELETAL:  Bilateral 1-2+ pitting edema to knees, left slightly greater than right. No erythema or warmth. Negative Homan's sign; No deformity  SKIN: Warm and dry NEUROLOGIC:  Alert and oriented x 3 PSYCHIATRIC:  Normal affect   ASSESSMENT:    1. Edema, unspecified type   2. Morbid obesity (HCC)   3. SOB (shortness of breath)   4. Hypertension, unspecified type   5. Hyperlipidemia, unspecified hyperlipidemia type   6. Tobacco abuse counseling    PLAN:    1. Chronic bilateral lower extremity edema: Denies any known heart history. First noticed swelling with episode of inflammatory arthropathy. No associated cardiac signs or symptoms except for mild dyspnea on exertion. Consider cardiogenic vs. Venous insufficiency vs. Salt induced retention -ordered echo to evaluate for cardiac cause. BNP only 9.6, but may be somewhat falsely low due to obesity. -counseled on dietary recommendations, including salt avoidance. He would like to meet with nutrition for help making changes to his diet for his heart and for weight loss, ordered today  2. Cardiac risk factors: -tobacco: The patient was counseled on the dangers of tobacco use, and was advised to quit.  Reviewed strategies to maximize success, including removing cigarettes and smoking materials from environment, stress management, substitution of other forms of reinforcement and pharmacotherapy (nicotine replacement). -hypertension: borderline today, was better controlled at last visit. Aiming for salt reduction, continue to monitor -hyperlipidemia: on lovastatin and lovaza. Lovaza is likely to be expensive for him; this is aimed at his triglycerides, so weight loss and exercise may be able to help manage this -obesity: counseled on diet and activity, he is motivated. Ordered nutrition follow  up  Plan for follow up: follow up in 2 mos.   Medication Adjustments/Labs and Tests Ordered: Current medicines are reviewed at length with the patient today.  Concerns regarding medicines are outlined above.  Orders Placed This Encounter  Procedures  . Amb Referral to Nutrition and Diabetic E  . EKG 12-Lead  . ECHOCARDIOGRAM COMPLETE   No  orders of the defined types were placed in this encounter.   Patient Instructions  Medication Instructions: Your physician recommends that you continue on your current medications as directed.    If you need a refill on your cardiac medications before your next appointment, please call your pharmacy.   Labwork: None  Procedures/Testing: Your physician has requested that you have an echocardiogram. Echocardiography is a painless test that uses sound waves to create images of your heart. It provides your doctor with information about the size and shape of your heart and how well your heart's chambers and valves are working. This procedure takes approximately one hour. There are no restrictions for this procedure. [address]   Follow-Up: Your physician wants you to follow-up in 2 months with Dr. Cristal Deer. Special Instructions:    Thank you for choosing Heart[facility] at [facility]!!       Signed, Jodelle Red, MD PhD 04/06/2018 3:46 PM    [facility]

## 2018-04-06 NOTE — Patient Instructions (Signed)
Medication Instructions: Your physician recommends that you continue on your current medications as directed.    If you need a refill on your cardiac medications before your next appointment, please call your pharmacy.   Labwork: None  Procedures/Testing: Your physician has requested that you have an echocardiogram. Echocardiography is a painless test that uses sound waves to create images of your heart. It provides your doctor with information about the size and shape of your heart and how well your heart's chambers and valves are working. This procedure takes approximately one hour. There are no restrictions for this procedure. [address]   Follow-Up: Your physician wants you to follow-up in 2 months with Dr. Cristal Deerhristopher. Special Instructions:    Thank you for choosing [facility] at [facility]!!

## 2018-04-06 NOTE — Telephone Encounter (Signed)
Patient called stating that he went to his pharmacy to pick up prescription for the omega 3 and was advice by pharmacist that it would be $239 and was also told that medicaid would not be able to cover the prescription.   Patient states he does not have the money to pay out of pocket. Patient wants to know if there is any other medication that is similar that can be more affordable .  Please Advice [phone]  Thank you Louisa SecondGloria I Vargas Hernandez

## 2018-04-11 ENCOUNTER — Other Ambulatory Visit (HOSPITAL_COMMUNITY): Payer: Medicaid Other

## 2018-04-12 ENCOUNTER — Other Ambulatory Visit: Payer: Self-pay

## 2018-04-12 ENCOUNTER — Ambulatory Visit (HOSPITAL_COMMUNITY): Payer: Medicaid Other | Attending: Cardiology

## 2018-04-12 DIAGNOSIS — R0602 Shortness of breath: Secondary | ICD-10-CM | POA: Insufficient documentation

## 2018-04-12 DIAGNOSIS — R600 Localized edema: Secondary | ICD-10-CM | POA: Diagnosis present

## 2018-04-12 DIAGNOSIS — R609 Edema, unspecified: Secondary | ICD-10-CM

## 2018-04-12 DIAGNOSIS — I10 Essential (primary) hypertension: Secondary | ICD-10-CM | POA: Insufficient documentation

## 2018-04-12 DIAGNOSIS — E785 Hyperlipidemia, unspecified: Secondary | ICD-10-CM | POA: Insufficient documentation

## 2018-04-12 MED ORDER — PERFLUTREN LIPID MICROSPHERE
1.0000 mL | INTRAVENOUS | Status: AC | PRN
  Administered 2018-04-12: 1 mL via INTRAVENOUS

## 2018-04-13 DIAGNOSIS — I10 Essential (primary) hypertension: Secondary | ICD-10-CM

## 2018-04-17 ENCOUNTER — Telehealth: Payer: Self-pay | Admitting: Physician Assistant

## 2018-04-17 NOTE — Telephone Encounter (Signed)
Patient called requesting advice only for CPAP machine. Please follow up with patient.

## 2018-04-23 ENCOUNTER — Telehealth (INDEPENDENT_AMBULATORY_CARE_PROVIDER_SITE_OTHER): Payer: Self-pay | Admitting: Physician Assistant

## 2018-04-23 NOTE — Telephone Encounter (Signed)
Patient called requesting medication   acetaminophen-codeine (TYLENOL #3) 300-30 MG tablet   Patient uses [facility] [address], [place], [place] [address]    Please Advice [phone]

## 2018-04-23 NOTE — Telephone Encounter (Signed)
Call placed to the patient. He explained that he doesn't think that his CPAP machine is working correctly, the pressure he feels is too high. Explained to him that the machines like he received from the [employer], do not come with a warranty and this CM has not been able to locate a DME provider that will service the machines that they have not provided.  He did not have insurance at the time that this machine was provided to him, since then he has obtained medicaid.  Explained to him that this information will be shared with his PCP and also noted that he may be eligible for a new CPAP machine through a local DME company now that he has medicaid.   He also spoke about his concerns about his abdominal hernia that he states weighs about 50 lbs. He verbalized frustrations that he has not been able to have surgery to repair the hernia and would like to have that addressed before any procedures on his shoulders. Again informed him that his PCP would be notified.   He then stated that he has no income. He has been denied disability twice and recently hired [employer] to assist him. He said that he is worried that he will not be able to afford the $3.00 co-pays for his medications. Explained to him that [facility] works with [employer] [place] and if he would like to see if there is any further assistance they can provide for him regarding his disability, he can come to the clinic and complete a legal aid referral.  There is no guarantee that they will be able to help him but it is worth a try.

## 2018-04-23 NOTE — Telephone Encounter (Signed)
No refill. Pt has been referred to pain clinic. Please give him the following information:  [facility] [phone].

## 2018-04-23 NOTE — Telephone Encounter (Signed)
FWD to PCP. Cray Monnin S Schon Zeiders, CMA  

## 2018-04-24 NOTE — Telephone Encounter (Signed)
Please make an appointment for patient to discuss abdominal hernia and CPAP.

## 2018-04-25 ENCOUNTER — Ambulatory Visit (INDEPENDENT_AMBULATORY_CARE_PROVIDER_SITE_OTHER): Payer: Medicaid Other | Admitting: Orthopedic Surgery

## 2018-04-25 ENCOUNTER — Encounter (INDEPENDENT_AMBULATORY_CARE_PROVIDER_SITE_OTHER): Payer: Self-pay | Admitting: Orthopedic Surgery

## 2018-04-25 DIAGNOSIS — M19012 Primary osteoarthritis, left shoulder: Secondary | ICD-10-CM

## 2018-04-25 DIAGNOSIS — M19011 Primary osteoarthritis, right shoulder: Secondary | ICD-10-CM

## 2018-04-25 MED ORDER — ACETAMINOPHEN-CODEINE #3 300-30 MG PO TABS: ORAL_TABLET | ORAL | 0 refills | Status: DC

## 2018-04-25 NOTE — Telephone Encounter (Signed)
Called patient to inform and try to schedule an appointment for today since we had a opening but he can not make it in today. I told him if I had any cancelations for the following days or week I would contact him to try to schedule an ov.   Louisa SecondGloria I Vargas Hernandez

## 2018-04-25 NOTE — Telephone Encounter (Signed)
Noted  

## 2018-04-26 ENCOUNTER — Telehealth (INDEPENDENT_AMBULATORY_CARE_PROVIDER_SITE_OTHER): Payer: Self-pay | Admitting: Orthopedic Surgery

## 2018-04-26 NOTE — Telephone Encounter (Signed)
Left message informing patient that refill of tylenol 3 can not be done as he has been referred to pain management. Left number to [facility] for patient to call and check status of referral. Maryjean Mornempestt S Kalei Mckillop, CMA

## 2018-04-26 NOTE — Telephone Encounter (Signed)
Patient needs prior authorization for tylenol # 3. Patients # [phone]

## 2018-04-26 NOTE — Telephone Encounter (Signed)
Rx was written yesterday afternoon for patient at his OV. I just received faxed notification from pharmacy this afternoon. Will submit PA on  tracks website as soon as I get an opportunity.

## 2018-04-26 NOTE — Telephone Encounter (Signed)
Submitted on [place] tracks Medication approved Advised patient and pharmacy.

## 2018-04-29 ENCOUNTER — Encounter (INDEPENDENT_AMBULATORY_CARE_PROVIDER_SITE_OTHER): Payer: Self-pay | Admitting: Orthopedic Surgery

## 2018-04-29 DIAGNOSIS — M19011 Primary osteoarthritis, right shoulder: Secondary | ICD-10-CM

## 2018-04-29 MED ORDER — BUPIVACAINE HCL 0.25 % IJ SOLN
0.6600 mL | INTRAMUSCULAR | Status: AC | PRN
  Administered 2018-04-29: .66 mL via INTRA_ARTICULAR

## 2018-04-29 MED ORDER — LIDOCAINE HCL 1 % IJ SOLN
3.0000 mL | INTRAMUSCULAR | Status: AC | PRN
  Administered 2018-04-29: 3 mL

## 2018-04-29 MED ORDER — METHYLPREDNISOLONE ACETATE 40 MG/ML IJ SUSP
13.3300 mg | INTRAMUSCULAR | Status: AC | PRN
  Administered 2018-04-29: 13.33 mg via INTRA_ARTICULAR

## 2018-04-29 NOTE — Progress Notes (Signed)
Office Visit Note   Patient: Thomas Medina           Date of Birth: December 03, 1955           MRN: [id number] Visit Date: 04/25/2018 Requested by: Loletta SpecterGomez, Roger David, PA-C [address] [place], [place] [address] PCP: Denny LevyGomez, Roger David, PA-C  Subjective: Chief Complaint  Patient presents with  . Right Shoulder - Pain  . Left Shoulder - Pain    HPI: Thomas Medina is a patient with bilateral shoulder pain.  Reports popping in the shoulder on the right-hand side.  Old records are reviewed and he does have a known history of bilateral frozen shoulder.  He is doing his own physical therapy at home.  He was in physical therapy for 8 to 9 months.  He does have a history of both right and left glenohumeral joint injections earlier this year.  Patient does not have diabetes.  It is hard for him to lift things.  He also has a midline hernia which needs to be fixed.  Last glenohumeral joint injection lasted a few days.  MRI scan of the right shoulder in February 2019 showed changes consistent with adhesive capsulitis but also right shoulder AC joint arthritis.              ROS: All systems reviewed are negative as they relate to the chief complaint within the history of present illness.  Patient denies  fevers or chills.   Assessment & Plan: Visit Diagnoses:  1. Arthritis of both acromioclavicular joints     Plan: Impression is improvement in bilateral shoulder range of motion but with some tenderness to palpation and crepitus in the [facility] joint.  He is never had the [facility] joint injected.  Based on the restoration of his range of motion I think that the [facility] joint may be his pain generator at this time.  One-time prescription only for Tylenol 3 prescribed.  Inject right AC joint today under ultrasound guidance.  Follow-up as needed.  Pain medicine should not be refilled.  Follow-Up Instructions: Return if symptoms worsen or fail to improve.   Orders:  No orders of the defined types were placed in this  encounter.  Meds ordered this encounter  Medications  . acetaminophen-codeine (TYLENOL #3) 300-30 MG tablet    Sig: 1 po q d prn pain    Dispense:  15 tablet    Refill:  0      Procedures: Medium Joint Inj: R acromioclavicular on 04/29/2018 9:58 AM Indications: pain and diagnostic evaluation Details: 27 G 1.5 in needle, ultrasound-guided superior approach Medications: 13.33 mg methylPREDNISolone acetate 40 MG/ML; 0.66 mL bupivacaine 0.25 %; 3 mL lidocaine 1 % Outcome: tolerated well, no immediate complications Procedure, treatment alternatives, risks and benefits explained, specific risks discussed. Consent was given by the patient. Immediately prior to procedure a time out was called to verify the correct patient, procedure, equipment, support staff and site/side marked as required. Patient was prepped and draped in the usual sterile fashion.       Clinical Data: No additional findings.  Objective: Vital Signs: There were no vitals taken for this visit.  Physical Exam:   Constitutional: Patient appears well-developed HEENT:  Head: Normocephalic Eyes:EOM are normal Neck: Normal range of motion Cardiovascular: Normal rate Pulmonary/chest: Effort normal Neurologic: Patient is alert Skin: Skin is warm Psychiatric: Patient has normal mood and affect    Ortho Exam: Ortho exam demonstrates good cervical spine range of motion.  Patient has forward  flexion and abduction both well above 90 degrees in both shoulders.  Rotator cuff strength is intact bilaterally.  Mild AC joint tenderness is present.  This is more on the right than the left.  O'Brien's testing is negative bilaterally.  Does have some pain with crossarm adduction on the right compared to the left.  Specialty Comments:  No specialty comments available.  Imaging: No results found.   PMFS History: Patient Active Problem List   Diagnosis Date Noted  . Hyperlipidemia 03/29/2017  . Hypertension 03/27/2017    Past Medical History:  Diagnosis Date  . Depression   . Hypertension   . PTSD (post-traumatic stress disorder)   . Sleep apnea   . Vision abnormalities     Family History  Problem Relation Age of Onset  . Aneurysm Mother   . Heart attack Father   . Hypertension Father   . Hyperlipidemia Father   . Renal cancer Brother   . Kidney disease Brother     Past Surgical History:  Procedure Laterality Date  . POLYPECTOMY     Social History   Occupational History  . Not on file  Tobacco Use  . Smoking status: Current Every Day Smoker    Packs/day: 0.25    Types: Cigarettes  . Smokeless tobacco: Never Used  Substance and Sexual Activity  . Alcohol use: Yes    Comment: occ  . Drug use: No  . Sexual activity: Not on file

## 2018-05-01 ENCOUNTER — Telehealth (INDEPENDENT_AMBULATORY_CARE_PROVIDER_SITE_OTHER): Payer: Self-pay | Admitting: Physician Assistant

## 2018-05-01 ENCOUNTER — Other Ambulatory Visit (INDEPENDENT_AMBULATORY_CARE_PROVIDER_SITE_OTHER): Payer: Self-pay | Admitting: Physician Assistant

## 2018-05-01 DIAGNOSIS — G4733 Obstructive sleep apnea (adult) (pediatric): Secondary | ICD-10-CM

## 2018-05-01 NOTE — Telephone Encounter (Signed)
Patient would be able to get new CPAP from DME since he has Medicaid now. Please advise on if patient should be seen first or if order for CPAP can be placed without OV. [name], CMA

## 2018-05-01 NOTE — Telephone Encounter (Signed)
I have printed his Bipap order. He may pick up order but I don't know where he should pick up machine and supplies. Once he picks up machine and supplies, he should call the sleep center and see if they can help him set up.

## 2018-05-01 NOTE — Telephone Encounter (Signed)
[facility] but patient needs an appointment within in the last 6 months after sleep study. Patient already has appointment scheduled. [name], CMA

## 2018-05-01 NOTE — Telephone Encounter (Signed)
Patient called wanting to know update on his CPAP machine. Patient states that he thinks for the past two months it has not been working properly and since he now has medicaid he wants to know if PCP could send in order for a new CPAP machine.  Please advice [phone]  Thank you Louisa SecondGloria I Vargas Hernandez

## 2018-05-03 MED FILL — METOPROLOL SUCCINATE ER 50: 50 | 30 days supply | Qty: 90 | Fill #1

## 2018-05-03 MED FILL — LOVASTATIN 20 MG TABLET: 20 | 30 days supply | Qty: 30 | Fill #1

## 2018-05-03 MED FILL — $VIAGRA 100 MG TABLET: 100 | 30 days supply | Qty: 10 | Fill #9

## 2018-05-03 MED FILL — FUROSEMIDE 40 MG TAB: 40 | 30 days supply | Qty: 30 | Fill #6

## 2018-05-03 MED FILL — DULoxetine HCL 20 MG CPEP: 20 | 30 days supply | Qty: 60 | Fill #1

## 2018-05-03 MED FILL — LOSARTAN POTASSIUM 100 MG T: 100 | 30 days supply | Qty: 30 | Fill #1

## 2018-05-03 MED FILL — METFORMIN HCL ER 500 MG TAB: 500 | 30 days supply | Qty: 30 | Fill #2

## 2018-05-07 ENCOUNTER — Encounter: Payer: Self-pay | Admitting: Registered"

## 2018-05-07 ENCOUNTER — Encounter: Payer: Medicaid Other | Attending: Physician Assistant | Admitting: Registered"

## 2018-05-07 DIAGNOSIS — E669 Obesity, unspecified: Secondary | ICD-10-CM

## 2018-05-07 DIAGNOSIS — Z713 Dietary counseling and surveillance: Secondary | ICD-10-CM | POA: Diagnosis not present

## 2018-05-07 NOTE — Progress Notes (Signed)
  Medical Nutrition Therapy:  Appt start time: 2:05 end time:  3:15.   Assessment:  Primary concerns today: Pt states he has a hernia and wants to lose weight.    Pt expectations: knowledge of learning to lose weight, what he should eat and shouldn't eat, and help with healthy attitude  Pt states he has a daughter and 3 granddaughters who live locally. Pt states he has depression and PTSD. Pt states he sees a therapist once a month and also taking medications. Pt states he has hypertension and since being in today's appointment wants to focus more on heart health.  Pt states his blood pressure has been around 146/70. Pt states sometimes he does not eat red meat. Pt states sometimes he expects results too fast.   Pt states he has limited finances because he does not have income at this time.   Pt states he wants to discuss portions and sometimes he overeats because his food is so good.   Next visit: local nutrition resources [employer](EFNEP handout, pt resources)   Preferred Learning Style:   No preference indicated   Learning Readiness:   Contemplating  Ready  Change in progress   MEDICATIONS: See list   DIETARY INTAKE:  Usual eating pattern includes 1-3 meals and 1-2 snacks per day.  Everyday foods include fruit, fruit juice, processed foods, alcohol, chicken, vegetables and ginger ale.  Avoided foods include red meat.    24-hr recall:  B ( AM): V8 juice + chicken breast sandwich or banana + V8 juice + fruit and veggie drink Snk ( AM): none  L ( PM): sometimes skips or pot pie  Snk ( PM): sometimes grapes D ( PM): collard greens, stuffing, chicken, potato salad, bread  Snk ( PM): sometimes grapres Beverages: juice, beer (2/day), ginger ale, soda (rarely), water  Usual physical activity: none stated  Estimated energy needs: 2000 calories 225 g carbohydrates 150 g protein 56 g fat  Progress Towards Goal(s):  In progress.   Nutritional Diagnosis:  NB-3.2 Limited access  to food or water As related to  lack of financial resources ti purchase sufficient quanitity or variety of healthful foods.  As evidenced by pt reports not having enough money for food at times.    Intervention:  Nutrition education and counseling. Pt was educated and counseled on ways to improve heart health and details of DASH plan. Pt was in agreement with goals listed.  Goals: - Aim to eat 3 meals a day consisently.  - Add in more fruits and vegetables to meals.  - Contact [employer] for scholarships.  - Have vegetables with lunch and dinner.  - Choose whole grain options.   Teaching Method Utilized:  Visual Auditory Hands on  Handouts given during visit include:  Hypertension Nutrition Therapy  Barriers to learning/adherence to lifestyle change: limited finances  Demonstrated degree of understanding via:  Teach Back   Monitoring/Evaluation:  Dietary intake, exercise, and body weight in 1 month(s).

## 2018-05-07 NOTE — Patient Instructions (Addendum)
-   Aim to eat 3 meals a day consisently.   - Add in more fruits and vegetables to meals.   - Contact [employer] for scholarships.   - Have vegetables with lunch and dinner.   - Choose whole grain options.

## 2018-05-15 ENCOUNTER — Other Ambulatory Visit (INDEPENDENT_AMBULATORY_CARE_PROVIDER_SITE_OTHER): Payer: Self-pay | Admitting: Physician Assistant

## 2018-05-15 ENCOUNTER — Encounter (INDEPENDENT_AMBULATORY_CARE_PROVIDER_SITE_OTHER): Payer: Self-pay | Admitting: Physician Assistant

## 2018-05-15 ENCOUNTER — Ambulatory Visit (INDEPENDENT_AMBULATORY_CARE_PROVIDER_SITE_OTHER): Payer: Medicaid Other | Admitting: Physician Assistant

## 2018-05-15 VITALS — BP 123/65 | HR 85 | Temp 98.5°F | Ht 68.0 in | Wt 291.0 lb

## 2018-05-15 DIAGNOSIS — G4733 Obstructive sleep apnea (adult) (pediatric): Secondary | ICD-10-CM

## 2018-05-15 NOTE — Progress Notes (Signed)
Subjective:  Patient ID: Thomas Medina, male    DOB: Aug 31, 1956  Age: 62 y.o. MRN: [id number]  CC: OSA  HPI Klaus Ellender a 62 y.o.malewith a medical history ofOSA, HTN, HLD, andbilateral adhesive capsulitis presents to discuss OSA. He has a remote history of OSA symptoms including daytime somnolence, snoring, and witnessed pause of breath during sleep. Had been referred to the sleep center and eventually had a sleep study done on 07/03/17 which found inadequate control during CPAP titration. Pt had thus been recommended to use a BiPAP.  BiPAP and supplies was written for but has not been filled because insurance requires a face to face within six months of prescription for BiPAP. Symptoms still persistent along with morbid obesity and the need to take anti-hypertensives. STOP-BANG score 8.     Outpatient Medications Prior to Visit  Medication Sig Dispense Refill  . acetaminophen-codeine (TYLENOL #3) 300-30 MG tablet 1 po q d prn pain 15 tablet 0  . DULoxetine (CYMBALTA) 20 MG capsule Take 2 capsules (40 mg total) by mouth daily. 60 capsule 11  . furosemide (LASIX) 40 MG tablet Take 1 tablet (40 mg total) by mouth daily as needed. 30 tablet 5  . hydrOXYzine (ATARAX/VISTARIL) 50 MG tablet Take 50 mg by mouth at bedtime.    Marland Kitchen ibuprofen (ADVIL,MOTRIN) 600 MG tablet Take 1 tablet (600 mg total) by mouth every 8 (eight) hours as needed. 30 tablet 1  . losartan (COZAAR) 100 MG tablet Take 1 tablet (100 mg total) by mouth daily. 30 tablet 11  . lovastatin (MEVACOR) 20 MG tablet Take 1 tablet (20 mg total) by mouth at bedtime. 30 tablet 11  . metFORMIN (GLUCOPHAGE XR) 500 MG 24 hr tablet Take 1 tablet (500 mg total) by mouth daily with breakfast. 30 tablet 11  . metoprolol succinate (TOPROL-XL) 50 MG 24 hr tablet TAKE 1 TABLET BY MOUTH DAILY. TAKE WITH OR IMMEDIATELY FOLLOWING A MEAL. 30 tablet 11  . omega-3 acid ethyl esters (LOVAZA) 1 g capsule Take 2 capsules (2 g total) by mouth 2 (two)  times daily. (Patient not taking: Reported on 05/07/2018) 120 capsule 5  . oxyCODONE-acetaminophen (PERCOCET/ROXICET) 5-325 MG tablet Take by mouth every 4 (four) hours as needed for severe pain.    . sildenafil (VIAGRA) 100 MG tablet Take 1 tablet (100 mg total) by mouth daily as needed for erectile dysfunction. 10 tablet 11   No facility-administered medications prior to visit.      ROS Review of Systems  Constitutional: Negative for chills, fever and malaise/fatigue.  Eyes: Negative for blurred vision.  Respiratory: Negative for shortness of breath.   Cardiovascular: Negative for chest pain and palpitations.  Gastrointestinal: Negative for abdominal pain and nausea.  Genitourinary: Negative for dysuria and hematuria.  Musculoskeletal: Negative for joint pain and myalgias.  Skin: Negative for rash.  Neurological: Negative for tingling and headaches.  Psychiatric/Behavioral: Negative for depression. The patient is not nervous/anxious.     Objective:   Vitals:   05/15/18 1358  BP: 123/65  Pulse: 85  Temp: 98.5 F (36.9 C)  TempSrc: Oral  SpO2: 95%  Weight: 291 lb (132 kg)  Height: 5\' 8"  (1.727 m)      Physical Exam  Constitutional: He is oriented to person, place, and time.  NAD, obese, polite  HENT:  Head: Normocephalic and atraumatic.  Neck circumference 18.25 inches  Eyes: Conjunctivae are normal. No scleral icterus.  Neck: Normal range of motion. Thyromegaly: difficult to assess due to body  habitus.  Difficult to assess for thyromegaly or nodules due to body habitus  Cardiovascular: Normal rate, regular rhythm and normal heart sounds.  Lower extremity distal pulses difficult to assess due to body habitus.  Pulmonary/Chest: Effort normal and breath sounds normal.  Abdominal: Soft. Bowel sounds are normal. There is no tenderness.  Difficult to assess for abdominal mass due to body habitus   Musculoskeletal:  Bilateral non-pitting edema of the lower extremity   Neurological: He is alert and oriented to person, place, and time.  Skin: Skin is warm and dry. No rash noted.  Psychiatric: He has a normal mood and affect.  Vitals reviewed.    Assessment & Plan:   1. OSA (obstructive sleep apnea) - Bipap; Future. - Trial of BiPAP therapy on 25/21 cm H2O with a Medium size [employer] Full Face Mask Simplus mask and heated humidification.   Follow-up: Return if symptoms worsen or fail to improve.   Loletta Specter PA

## 2018-05-15 NOTE — Patient Instructions (Signed)

## 2018-05-16 LAB — HM DIABETES EYE EXAM

## 2018-05-24 ENCOUNTER — Ambulatory Visit (INDEPENDENT_AMBULATORY_CARE_PROVIDER_SITE_OTHER): Payer: Medicaid Other | Admitting: Physician Assistant

## 2018-06-07 ENCOUNTER — Encounter: Payer: Self-pay | Admitting: Registered"

## 2018-06-07 ENCOUNTER — Encounter: Payer: Medicaid Other | Attending: Physician Assistant | Admitting: Registered"

## 2018-06-07 DIAGNOSIS — Z713 Dietary counseling and surveillance: Secondary | ICD-10-CM | POA: Diagnosis not present

## 2018-06-07 DIAGNOSIS — I10 Essential (primary) hypertension: Secondary | ICD-10-CM

## 2018-06-07 NOTE — Patient Instructions (Signed)
-   Contact [facility] this week.   - Increase physical activity with water exercises at local fitness center.

## 2018-06-07 NOTE — Progress Notes (Signed)
  Medical Nutrition Therapy:  Appt start time: 2:05 end time: 2:45.   Assessment:  Primary concerns today: Pt states he has a hernia and wants to lose weight.    Pt expectations: knowledge of learning to lose weight, what he should eat and shouldn't eat, and help with healthy attitude  Pt states he has a daughter and 3 granddaughters who live locally. Pt states he has depression and PTSD. Pt states he sees a therapist once a month and also taking medications. Pt states he has hypertension and since being in today's appointment wants to focus more on heart health.  Pt states he has been eating more vegetables with meals. Pt states he has been able to have 3 meals a day. Pt states he takes steps at home for physical activity. Pt is interested in going to a fitness center for physical activity because he states he does not do anything during the day other than go to appointments. Pt expresses concern for housing because he will need to move soon because daughter is being deployed.   Pt states he has been eating whole wheat bread, brown rice, and does not eat a lot of bread. Pt states he has been avoiding potatoes although he loves them. Pt states he has started using sea salt instead of iodized salt.   Previous visit: Pt states sometimes he expects results too fast. Pt states he has limited finances because he does not have income at this time. Pt states he wants to discuss portions and sometimes he overeats because his food is so good.   Next visit: physical activity, fluid intake, review previous goals  Preferred Learning Style:   No preference indicated   Learning Readiness:   Contemplating  Ready  Change in progress   MEDICATIONS: See list   DIETARY INTAKE:  Usual eating pattern includes 1-3 meals and 1-2 snacks per day.  Everyday foods include fruit, fruit juice, processed foods, alcohol, chicken, vegetables and ginger ale.  Avoided foods include red meat.    24-hr recall:  B  ( AM): V8 juice + chicken breast sandwich or banana + V8 juice + fruit and veggie drink Snk ( AM): none  L ( PM): sometimes skips or pot pie  Snk ( PM): sometimes grapes D ( PM): collard greens, stuffing, chicken, potato salad, bread  Snk ( PM): sometimes grapres Beverages: juice, beer (2/day), ginger ale, soda (rarely), water  Usual physical activity: walking 20-30 min, 3 days/week and takes steps  Estimated energy needs: 2000 calories 225 g carbohydrates 150 g protein 56 g fat  Progress Towards Goal(s):  In progress.   Nutritional Diagnosis:  NB-3.2 Limited access to food or water As related to  lack of financial resources ti purchase sufficient quanitity or variety of healthful foods.  As evidenced by pt reports not having enough money for food at times.    Intervention:  Nutrition education and counseling. Pt was provided information about local senior fitness center.  Pt was in agreement with goals listed.  Goals: - Contact [facility] this week.  - Increase physical activity with water exercises at local fitness center.    Teaching Method Utilized:  Visual Auditory Hands on  Handouts given during visit include:  [facility]  Barriers to learning/adherence to lifestyle change: limited finances  Demonstrated degree of understanding via:  Teach Back   Monitoring/Evaluation:  Dietary intake, exercise, and body weight in 1 month(s).

## 2018-06-08 ENCOUNTER — Telehealth (INDEPENDENT_AMBULATORY_CARE_PROVIDER_SITE_OTHER): Payer: Self-pay | Admitting: Physician Assistant

## 2018-06-08 NOTE — Telephone Encounter (Signed)
Patient called wanting to request medication Cialis and stated it was for erectile dysfunction. Patient stated that it was dicussed with PCP and is now wanting to know if PCP could prescribe Rx to him.  Please advice [phone]  Thank you Louisa Second

## 2018-06-11 ENCOUNTER — Ambulatory Visit: Payer: Medicaid Other | Admitting: Cardiology

## 2018-06-12 NOTE — Telephone Encounter (Signed)
Patient is aware that he has 11 refills on viagra. He does not want that any longer he has heard that Cialis gives better results and would like to try that. Please send to [facility]. Maryjean Morn, CMA

## 2018-06-12 NOTE — Telephone Encounter (Signed)
I have written sildenafil as a medication refill. I have never gotten to properly evaluate his erectile dysfunction due to discussing his many other complaints. He needs an office visit to discuss erectile dysfunction alone. He may also need urological evaluation.

## 2018-06-13 NOTE — Telephone Encounter (Signed)
Patient is aware and has scheduled for 07/10/18 at 1:30. Maryjean Morn, CMA

## 2018-07-04 ENCOUNTER — Encounter: Payer: Medicaid Other | Attending: Physician Assistant | Admitting: Registered"

## 2018-07-04 ENCOUNTER — Encounter: Payer: Self-pay | Admitting: Registered"

## 2018-07-04 DIAGNOSIS — Z713 Dietary counseling and surveillance: Secondary | ICD-10-CM | POA: Insufficient documentation

## 2018-07-04 DIAGNOSIS — I10 Essential (primary) hypertension: Secondary | ICD-10-CM

## 2018-07-04 NOTE — Progress Notes (Signed)
Medical Nutrition Therapy:  Appt start time: 2:05 end time: 2:41.   Assessment:  Primary concerns today: Pt states he has a hernia and wants to lose weight.    Pt expectations: knowledge of learning to lose weight, what he should eat and shouldn't eat, and help with healthy attitude  Pt states he wants to set his mind to lose weight healthfully. Pt states he started noticing swelling 2 weeks ago. Pt states he was busy today therefore didn't eat anything for lunch. Pt states he plans to attend senior center at least 3 times/week once he gets schedule for health care appointments.   Pt states he has a daughter and 3 granddaughters who live locally. Pt states he has depression and PTSD. Pt states he sees a therapist once a month and also taking medications. Pt states he has hypertension and since being in today's appointment wants to focus more on heart health.  Pt states he has been eating more vegetables with meals. Pt states he has been able to have 3 meals a day. Pt states he takes steps at home for physical activity. Pt is interested in going to a fitness center for physical activity because he states he does not do anything during the day other than go to appointments. Pt expresses concern for housing because he will need to move soon because daughter is being deployed.   Pt states he has been eating whole wheat bread, brown rice, and does not eat a lot of bread. Pt states he has been avoiding potatoes although he loves them. Pt states he has started using sea salt instead of iodized salt.   Previous visit: Pt states sometimes he expects results too fast. Pt states he has limited finances because he does not have income at this time. Pt states he wants to discuss portions and sometimes he overeats because his food is so good.   Next visit: physical activity, fluid intake, review previous goals  Preferred Learning Style:   No preference indicated   Learning Readiness:    Contemplating  Ready  Change in progress   MEDICATIONS: See list   DIETARY INTAKE:  Usual eating pattern includes 1-3 meals and 1-2 snacks per day.  Everyday foods include fruit, fruit juice, processed foods, alcohol, chicken, vegetables and ginger ale.  Avoided foods include red meat.    24-hr recall:  B ( AM): 3 scrambled eggs + 2 slices of wheat bread + water Snk ( AM): none  L ( PM): sometimes skips or pot pie  Snk ( PM): sometimes grapes D ( PM): 2 bowls of: steamed vegetables + fried cabbage + chicken  Snk ( PM): sometimes grapes Beverages: juice, beer (2/day), ginger ale, soda (rarely), water  Usual physical activity: walking 20-30 min, 3 days/week and takes steps  Estimated energy needs: 2000 calories 225 g carbohydrates 150 g protein 56 g fat  Progress Towards Goal(s):  In progress.   Nutritional Diagnosis:  NB-3.2 Limited access to food or water As related to  lack of financial resources ti purchase sufficient quanitity or variety of healthful foods.  As evidenced by pt reports not having enough money for food at times.    Intervention:  Nutrition education and counseling. Pt was educated and counseled on A1c values, sodium intake, ways to reduce salk intake, and importance of physical activity. Pt was in agreement with goals listed.  Goals: - Get planner and schedule days for physical activity.  - Check into Out of [employer]  [employer].  - Limit sodium intake to less than 2300 mg/day.  - Season food with other herbs and spices.  - Try to have 3 meals a day.     Teaching Method Utilized:  Visual Auditory Hands on  Handouts given during visit include:  Out of the [employer]  Barriers to learning/adherence to lifestyle change: limited finances  Demonstrated degree of understanding via:  Teach Back   Monitoring/Evaluation:  Dietary intake, exercise, and body weight in 1 month(s).

## 2018-07-04 NOTE — Patient Instructions (Addendum)
-   Get planner and schedule days for physical activity.   - Check into Out of [employer].   - Limit sodium intake to less than 2300 mg/day.   - Season food with other herbs and spices.   - Try to have 3 meals a day.

## 2018-07-05 ENCOUNTER — Other Ambulatory Visit: Payer: Self-pay

## 2018-07-05 ENCOUNTER — Encounter: Payer: Self-pay | Admitting: Physician Assistant

## 2018-07-05 ENCOUNTER — Ambulatory Visit (INDEPENDENT_AMBULATORY_CARE_PROVIDER_SITE_OTHER): Payer: Medicaid Other | Admitting: Physician Assistant

## 2018-07-05 ENCOUNTER — Encounter (INDEPENDENT_AMBULATORY_CARE_PROVIDER_SITE_OTHER): Payer: Self-pay | Admitting: Physician Assistant

## 2018-07-05 VITALS — BP 154/72 | HR 70 | Temp 97.5°F | Ht 68.0 in | Wt 297.8 lb

## 2018-07-05 DIAGNOSIS — E119 Type 2 diabetes mellitus without complications: Secondary | ICD-10-CM

## 2018-07-05 DIAGNOSIS — I10 Essential (primary) hypertension: Secondary | ICD-10-CM | POA: Diagnosis not present

## 2018-07-05 LAB — POCT GLYCOSYLATED HEMOGLOBIN (HGB A1C): Hemoglobin A1C: 6.8 % — AB (ref 4.0–5.6)

## 2018-07-05 MED ORDER — FUROSEMIDE 40 MG PO TABS
60.0000 mg | ORAL_TABLET | Freq: Every day | ORAL | 5 refills | Status: DC | PRN
Start: 2018-07-05 — End: 2018-08-02

## 2018-07-05 MED FILL — FUROSEMIDE 40 MG TAB: 40 | 30 days supply | Qty: 45 | Fill #0

## 2018-07-05 NOTE — Patient Instructions (Signed)
Edema Edema is an abnormal buildup of fluids in your bodytissues. Edema is somewhatdependent on gravity to pull the fluid to the lowest place in your body. That makes the condition more common in the legs and thighs (lower extremities). Painless swelling of the feet and ankles is common and becomes more likely as you get older. It is also common in looser tissues, like around your eyes. When the affected area is squeezed, the fluid may move out of that spot and leave a dent for a few moments. This dent is called pitting. What are the causes? There are many possible causes of edema. Eating too much salt and being on your feet or sitting for a long time can cause edema in your legs and ankles. Hot weather may make edema worse. Common medical causes of edema include:  Heart failure.  Liver disease.  Kidney disease.  Weak blood vessels in your legs.  Cancer.  An injury.  Pregnancy.  Some medications.  Obesity.  What are the signs or symptoms? Edema is usually painless.Your skin may look swollen or shiny. How is this diagnosed? Your health care provider may be able to diagnose edema by asking about your medical history and doing a physical exam. You may need to have tests such as X-rays, an electrocardiogram, or blood tests to check for medical conditions that may cause edema. How is this treated? Edema treatment depends on the cause. If you have heart, liver, or kidney disease, you need the treatment appropriate for these conditions. General treatment may include:  Elevation of the affected body part above the level of your heart.  Compression of the affected body part. Pressure from elastic bandages or support stockings squeezes the tissues and forces fluid back into the blood vessels. This keeps fluid from entering the tissues.  Restriction of fluid and salt intake.  Use of a water pill (diuretic). These medications are appropriate only for some types of edema. They pull fluid  out of your body and make you urinate more often. This gets rid of fluid and reduces swelling, but diuretics can have side effects. Only use diuretics as directed by your health care provider.  Follow these instructions at home:  Keep the affected body part above the level of your heart when you are lying down.  Do not sit still or stand for prolonged periods.  Do not put anything directly under your knees when lying down.  Do not wear constricting clothing or garters on your upper legs.  Exercise your legs to work the fluid back into your blood vessels. This may help the swelling go down.  Wear elastic bandages or support stockings to reduce ankle swelling as directed by your health care provider.  Eat a low-salt diet to reduce fluid if your health care provider recommends it.  Only take medicines as directed by your health care provider. Contact a health care provider if:  Your edema is not responding to treatment.  You have heart, liver, or kidney disease and notice symptoms of edema.  You have edema in your legs that does not improve after elevating them.  You have sudden and unexplained weight gain. Get help right away if:  You develop shortness of breath or chest pain.  You cannot breathe when you lie down.  You develop pain, redness, or warmth in the swollen areas.  You have heart, liver, or kidney disease and suddenly get edema.  You have a fever and your symptoms suddenly get worse. This information is   not intended to replace advice given to you by your health care provider. Make sure you discuss any questions you have with your health care provider. Document Released: 08/29/2005 Document Revised: 02/04/2016 Document Reviewed: 06/21/2013 [employer] Interactive Patient Education  2017 [employer]  

## 2018-07-05 NOTE — Progress Notes (Signed)
Subjective:  Patient ID: Thomas Medina, male    DOB: 01/25/1956  Age: 62 y.o. MRN: [id number]  CC: edema  HPI  Narada Uzzle a 62 y.o.malewith a medical history ofOSA, HTN,HLD,DM2, andbilateraladhesive capsulitis presents with bilateral LE edema. Says he may not have been taking his Lasix 40 mg daily. BP 154/72 mmHg today. Previously well controlled at 123/65 mmHg. Does not endorse CP, palpitations, SOB, HA, tingling, numbness, abdominal pain, f/c/n/v, or GI/GU sxs.    Outpatient Medications Prior to Visit  Medication Sig Dispense Refill  . DULoxetine (CYMBALTA) 20 MG capsule Take 2 capsules (40 mg total) by mouth daily. 60 capsule 11  . furosemide (LASIX) 40 MG tablet Take 1 tablet (40 mg total) by mouth daily as needed. 30 tablet 5  . hydrOXYzine (ATARAX/VISTARIL) 50 MG tablet Take 50 mg by mouth at bedtime.    Marland Kitchen losartan (COZAAR) 100 MG tablet Take 1 tablet (100 mg total) by mouth daily. 30 tablet 11  . lovastatin (MEVACOR) 20 MG tablet Take 1 tablet (20 mg total) by mouth at bedtime. 30 tablet 11  . metFORMIN (GLUCOPHAGE XR) 500 MG 24 hr tablet Take 1 tablet (500 mg total) by mouth daily with breakfast. 30 tablet 11  . metoprolol succinate (TOPROL-XL) 50 MG 24 hr tablet TAKE 1 TABLET BY MOUTH DAILY. TAKE WITH OR IMMEDIATELY FOLLOWING A MEAL. 30 tablet 11  . omega-3 acid ethyl esters (LOVAZA) 1 g capsule Take 2 capsules (2 g total) by mouth 2 (two) times daily. 120 capsule 5  . ibuprofen (ADVIL,MOTRIN) 600 MG tablet Take 1 tablet (600 mg total) by mouth every 8 (eight) hours as needed. 30 tablet 1  . sildenafil (VIAGRA) 100 MG tablet Take 1 tablet (100 mg total) by mouth daily as needed for erectile dysfunction. 10 tablet 11  . acetaminophen-codeine (TYLENOL #3) 300-30 MG tablet 1 po q d prn pain 15 tablet 0  . oxyCODONE-acetaminophen (PERCOCET/ROXICET) 5-325 MG tablet Take by mouth every 4 (four) hours as needed for severe pain.     No facility-administered medications  prior to visit.      ROS Review of Systems  Constitutional: Negative for chills, fever and malaise/fatigue.  Eyes: Negative for blurred vision.  Respiratory: Negative for shortness of breath.   Cardiovascular: Positive for leg swelling. Negative for chest pain and palpitations.  Gastrointestinal: Negative for abdominal pain and nausea.  Genitourinary: Negative for dysuria and hematuria.  Musculoskeletal: Negative for joint pain and myalgias.  Skin: Negative for rash.  Neurological: Negative for tingling and headaches.  Psychiatric/Behavioral: Negative for depression. The patient is not nervous/anxious.     Objective:  BP (!) 154/72 (BP Location: Left Arm, Patient Position: Sitting, Cuff Size: Large)   Pulse 70   Temp (!) 97.5 F (36.4 C) (Oral)   Ht 5\' 8"  (1.727 m)   Wt 297 lb 12.8 oz (135.1 kg)   SpO2 93%   BMI 45.28 kg/m   BP/Weight 07/05/2018 05/15/2018 04/06/2018  Systolic BP 154 123 140  Diastolic BP 72 65 60  Wt. (Lbs) 297.8 291 291  BMI 45.28 44.25 42.97      Physical Exam  Constitutional: He is oriented to person, place, and time.  Well developed, obese, NAD, polite  HENT:  Head: Normocephalic and atraumatic.  Eyes: No scleral icterus.  Neck: Normal range of motion. Neck supple. No thyromegaly present.  Cardiovascular: Normal rate, regular rhythm and normal heart sounds.  2+ pitting edema bilaterally  Pulmonary/Chest: Effort normal and breath sounds normal.  Neurological: He is alert and oriented to person, place, and time.  Skin: Skin is warm and dry. No rash noted. No erythema. No pallor.  Psychiatric: He has a normal mood and affect. His behavior is normal. Thought content normal.  Vitals reviewed.    Assessment & Plan:   1. Type 2 diabetes mellitus without complication, without long-term current use of insulin (HCC) - HgB A1c 6.8%, down from 6.9%   2. Hypertension, unspecified type - Increase furosemide (LASIX) 40 MG tablet; Take 1.5 tablets (60  mg total) by mouth daily as needed.  Dispense: 45 tablet; Refill: 5   Meds ordered this encounter  Medications  . furosemide (LASIX) 40 MG tablet    Sig: Take 1.5 tablets (60 mg total) by mouth daily as needed.    Dispense:  45 tablet    Refill:  5    Order Specific Question:   Supervising Provider    Answer:   Hoy Register [[id number]]    Follow-up: Return in about 4 weeks (around 08/02/2018) for LE edema.   Loletta Specter PA

## 2018-07-09 ENCOUNTER — Encounter: Payer: Self-pay | Admitting: Cardiology

## 2018-07-09 ENCOUNTER — Ambulatory Visit (INDEPENDENT_AMBULATORY_CARE_PROVIDER_SITE_OTHER): Payer: Medicaid Other | Admitting: Cardiology

## 2018-07-09 VITALS — BP 111/55 | HR 81 | Resp 16 | Ht 68.0 in | Wt 296.8 lb

## 2018-07-09 DIAGNOSIS — F17200 Nicotine dependence, unspecified, uncomplicated: Secondary | ICD-10-CM | POA: Insufficient documentation

## 2018-07-09 DIAGNOSIS — I10 Essential (primary) hypertension: Secondary | ICD-10-CM

## 2018-07-09 DIAGNOSIS — Z716 Tobacco abuse counseling: Secondary | ICD-10-CM

## 2018-07-09 DIAGNOSIS — E782 Mixed hyperlipidemia: Secondary | ICD-10-CM

## 2018-07-09 DIAGNOSIS — R600 Localized edema: Secondary | ICD-10-CM

## 2018-07-09 DIAGNOSIS — Z6841 Body Mass Index (BMI) 40.0 and over, adult: Secondary | ICD-10-CM

## 2018-07-09 NOTE — Patient Instructions (Signed)
Medication Instructions:  Your Physician recommend you continue on your current medication as directed.    If you need a refill on your cardiac medications before your next appointment, please call your pharmacy.   Lab work: None  If you have labs (blood work) drawn today and your tests are completely normal, you will receive your results only by: Marland Kitchen MyChart Message (if you have MyChart) OR . A paper copy in the mail If you have any lab test that is abnormal or we need to change your treatment, we will call you to review the results.  Testing/Procedures: None  Follow-Up: At [facility], you and your health needs are our priority.  As part of our continuing mission to provide you with exceptional heart care, we have created designated Provider Care Teams.  These Care Teams include your primary Cardiologist (physician) and Advanced Practice Providers (APPs -  Physician Assistants and Nurse Practitioners) who all work together to provide you with the care you need, when you need it. You will need a follow up appointment in 3 months.  Please call our office 2 months in advance to schedule this appointment.  You may see Jodelle Red, MD or one of the following Advanced Practice Providers on your designated Care Team:   Theodore Demark, PA-C . Joni Reining, DNP, ANP  Any Other Special Instructions Will Be Listed Below (If Applicable).

## 2018-07-09 NOTE — Progress Notes (Signed)
Cardiology Office Note:    Date:  07/09/2018   ID:  Thomas Medina, DOB 04-13-1956, MRN [id number]  PCP:  Loletta Specter, PA-C  Cardiologist:  Jodelle Red, MD PhD  Referring MD: Loletta Specter, PA-C   Chief Complaint  Patient presents with  . Edema    History of Present Illness:    Thomas Medina is a 62 y.o. male with a hx of type II diabetes, hypertension, sleep apnea who is seen in follow up today for LE edema. He was seen as a new consult on as a new consult on 04/06/18. At that time, noted about 7 mos of stable LE edema. Had echo, no systolic function, no significant valvular disease, only grade 1 diastolic dysfunction.  Today: LE edema still present. Worse in the AM, better when he is active. No urine on prior urine dipstick, CMP with only mildly abnormal transaminase.  Weights are down a few pounds. Taking furosemide every day, recently last week had dose increased from 40 to 60 mg with no change in the swelling. No chest pain or shortness of breath, no PND or orthopnea.  No palpitations or syncope. Working on weight loss, working with nutritionist. Using BiPAP at night. Recently started having cough with white-yellow phlegm, no fevers/chills. Granddaughter had a cold but no other recent sick contacts. Slowly cutting back on cigarettes, down to 3-4/day. Plans to sign up for 3 days/week of exercise soon.    Past Medical History:  Diagnosis Date  . Depression   . Hypertension   . PTSD (post-traumatic stress disorder)   . Sleep apnea   . Vision abnormalities     Past Surgical History:  Procedure Laterality Date  . POLYPECTOMY      Current Medications: Current Outpatient Medications on File Prior to Visit  Medication Sig  . DULoxetine (CYMBALTA) 20 MG capsule Take 2 capsules (40 mg total) by mouth daily.  . furosemide (LASIX) 40 MG tablet Take 1.5 tablets (60 mg total) by mouth daily as needed.  . hydrOXYzine (ATARAX/VISTARIL) 50 MG tablet Take 50 mg by  mouth at bedtime.  Marland Kitchen ibuprofen (ADVIL,MOTRIN) 600 MG tablet Take 1 tablet (600 mg total) by mouth every 8 (eight) hours as needed.  Marland Kitchen losartan (COZAAR) 100 MG tablet Take 1 tablet (100 mg total) by mouth daily.  Marland Kitchen lovastatin (MEVACOR) 20 MG tablet Take 1 tablet (20 mg total) by mouth at bedtime.  . metFORMIN (GLUCOPHAGE-XR) 750 MG 24 hr tablet Take 750 mg by mouth daily with breakfast.  . metoprolol succinate (TOPROL-XL) 50 MG 24 hr tablet TAKE 1 TABLET BY MOUTH DAILY. TAKE WITH OR IMMEDIATELY FOLLOWING A MEAL.  . sildenafil (VIAGRA) 100 MG tablet Take 1 tablet (100 mg total) by mouth daily as needed for erectile dysfunction.  Marland Kitchen omega-3 acid ethyl esters (LOVAZA) 1 g capsule Take 2 capsules (2 g total) by mouth 2 (two) times daily. (Patient not taking: Reported on 07/09/2018)   No current facility-administered medications on file prior to visit.      Allergies:   Patient has no known allergies.   Social History   Socioeconomic History  . Marital status: Divorced    Spouse name: Not on file  . Number of children: Not on file  . Years of education: Not on file  . Highest education level: Not on file  Occupational History  . Not on file  Social Needs  . Financial resource strain: Not on file  . Food insecurity:    Worry: Sometimes  true    Inability: Sometimes true  . Transportation needs:    Medical: Not on file    Non-medical: Not on file  Tobacco Use  . Smoking status: Current Every Day Smoker    Packs/day: 0.25    Types: Cigarettes  . Smokeless tobacco: Never Used  Substance and Sexual Activity  . Alcohol use: Yes    Comment: occ  . Drug use: No  . Sexual activity: Not on file  Lifestyle  . Physical activity:    Days per week: Not on file    Minutes per session: Not on file  . Stress: Not on file  Relationships  . Social connections:    Talks on phone: Not on file    Gets together: Not on file    Attends religious service: Not on file    Active member of club or  organization: Not on file    Attends meetings of clubs or organizations: Not on file    Relationship status: Not on file  Other Topics Concern  . Not on file  Social History Narrative  . Not on file     Family History: The patient's family history includes Aneurysm in his mother; Heart attack in his father; Hyperlipidemia in his father; Hypertension in his father; Kidney disease in his brother; Renal cancer in his brother. Brother had bioprosthetic valve replacement. Father passed from heart attack at age 51. Both of his father's brothers passed away from MI.  ROS:   Please see the history of present illness.  Additional pertinent ROS: Review of Systems  Constitutional: Negative for chills, fever, malaise/fatigue and weight loss.  HENT: Negative for ear pain and hearing loss.   Eyes: Negative for blurred vision and pain.  Respiratory: Positive for cough, sputum production and shortness of breath. Negative for hemoptysis and wheezing.   Cardiovascular: Positive for leg swelling. Negative for chest pain, palpitations, orthopnea, claudication and PND.  Gastrointestinal: Negative for abdominal pain, blood in stool, melena, nausea and vomiting.  Genitourinary: Negative for dysuria and hematuria.  Musculoskeletal: Positive for back pain and myalgias. Negative for falls.  Skin: Negative for itching and rash.  Neurological: Negative for focal weakness, loss of consciousness and headaches.  Endo/Heme/Allergies: Does not bruise/bleed easily.   EKGs/Labs/Other Studies Reviewed:    The following studies were reviewed today: Echo 04/12/18 Study Conclusions  - Left ventricle: The cavity size was normal. Wall thickness was   increased in a pattern of mild LVH. Systolic function was   vigorous. The estimated ejection fraction was in the range of 65%   to 70%. Wall motion was normal; there were no regional wall   motion abnormalities. Doppler parameters are consistent with   abnormal left  ventricular relaxation (grade 1 diastolic   dysfunction). The E/e&' ratio is >15, suggesting elevated LV   filling pressure. - Mitral valve: Mildly thickened leaflets . There was trivial   regurgitation. - Left atrium: The atrium was normal in size. - Right atrium: The atrium was mildly dilated. - Tricuspid valve: There was trivial regurgitation. - Pulmonary arteries: PA peak pressure: 12 mm Hg (S). - Inferior vena cava: The vessel was normal in size. The   respirophasic diameter changes were in the normal range (= 50%),   consistent with normal central venous pressure.  Impressions:  - LVEF 65-70%, mild LVH, normal wall motion, grade 1 DD, elevated   LV filling pressure, trivial MR, normal LA size, mild RAE,   trivial TR, RVSP 12 mmHg, normal  IVC.  EKG:  The ekg ordered previously demonstrates normal sinus rhythm with nonspecific ST changes (slight PR depression and concave st changes)  Recent Labs: 09/20/2017: Platelets 342 02/21/2018: BNP 9.6 03/22/2018: Hemoglobin 14.6 04/03/2018: ALT 68; BUN 11; Creatinine, Ser 0.85; Potassium 4.9; Sodium 140  Recent Lipid Panel    Component Value Date/Time   CHOL 168 04/03/2018 1132   TRIG 218 (H) 04/03/2018 1132   HDL 32 (L) 04/03/2018 1132   CHOLHDL 5.3 (H) 04/03/2018 1132   LDLCALC 92 04/03/2018 1132    Physical Exam:    VS:  BP (!) 111/55   Pulse 81   Resp 16   Ht 5\' 8"  (1.727 m)   Wt 296 lb 12.8 oz (134.6 kg)   SpO2 96%   BMI 45.13 kg/m     Wt Readings from Last 3 Encounters:  07/09/18 296 lb 12.8 oz (134.6 kg)  07/05/18 297 lb 12.8 oz (135.1 kg)  05/15/18 291 lb (132 kg)    GEN: Well nourished, well developed in no acute distress HEENT: Normal NECK: JVD 8 cm; No carotid bruits LYMPHATICS: No lymphadenopathy CARDIAC: regular rhythm, normal S1 and S2, no murmurs, rubs, gallops. Radial and DP pulses 2+ bilaterally. RESPIRATORY:  Clear to auscultation without rales, wheezing or rhonchi  ABDOMEN: Soft, non-tender,  non-distended. Abdominal hernia. MUSCULOSKELETAL:  Bilateral 1-2+ edema to knees, left slightly greater than right. Partially pitting but also nonpitting. No erythema or warmth. Negative Homan's sign; No deformity  SKIN: Warm and dry NEUROLOGIC:  Alert and oriented x 3 PSYCHIATRIC:  Normal affect   ASSESSMENT:    No diagnosis found. PLAN:    1. Chronic bilateral lower extremity edema: Denies any known heart history. First noticed swelling with episode of inflammatory arthropathy. No associated cardiac signs or symptoms except for mild dyspnea on exertion. Consider cardiogenic vs. Venous insufficiency vs. Salt induced retention -echo with normal systolic function, no significant valve issues, only grade 1 diastolic dysfunction. BNP only 9.6, but may be somewhat falsely low due to obesity. -counseled on dietary recommendations, including salt avoidance.  -may be venous insufficiency, as it improves with activity. Appears to have both pitting and nonpitting component. TSH normal, no protein in urine, LFTs largely unremarkable except for mild transaminase elevation.  -no significant change with lasix, also supporting venous insufficiency. Weights stable to decrease with nutrition efforts -encouraged increased physical activity. Once he can tolerate, recommend compression stockings. Leg elevation when sitting if possible.   2. Cardiac risk factors: -tobacco: The patient was counseled on the dangers of tobacco use, and was advised to quit.  Reviewed strategies to maximize success, including removing cigarettes and smoking materials from environment, stress management, substitution of other forms of reinforcement and pharmacotherapy (nicotine replacement). 7 minutes spent on tobacco cessation counseling. -hypertension: at goal today. Continue salt reduction, continue to monitor. Continue losartan and metoprolol. -hyperlipidemia: on lovastatin and lovaza. LDL 92, TG 218 Weight loss and exercise may be  able to help manage this -obesity: counseled on diet and activity, he is motivated. Working with nutritionist. Class 3 obesity, BMI 45.1. -diabetes: A1c 6.8. Working on diet control.  Plan for follow up: follow up in 3 mos to monitor edema.  Medication Adjustments/Labs and Tests Ordered: Current medicines are reviewed at length with the patient today.  Concerns regarding medicines are outlined above.  No orders of the defined types were placed in this encounter.  No orders of the defined types were placed in this encounter.   Patient Instructions  Medication Instructions:  Your Physician recommend you continue on your current medication as directed.    If you need a refill on your cardiac medications before your next appointment, please call your pharmacy.   Lab work: None  If you have labs (blood work) drawn today and your tests are completely normal, you will receive your results only by: Marland Kitchen MyChart Message (if you have MyChart) OR . A paper copy in the mail If you have any lab test that is abnormal or we need to change your treatment, we will call you to review the results.  Testing/Procedures: None  Follow-Up: At [facility], you and your health needs are our priority.  As part of our continuing mission to provide you with exceptional heart care, we have created designated Provider Care Teams.  These Care Teams include your primary Cardiologist (physician) and Advanced Practice Providers (APPs -  Physician Assistants and Nurse Practitioners) who all work together to provide you with the care you need, when you need it. You will need a follow up appointment in 3 months.  Please call our office 2 months in advance to schedule this appointment.  You may see Jodelle Red, MD or one of the following Advanced Practice Providers on your designated Care Team:   Theodore Demark, PA-C . Joni Reining, DNP, ANP  Any Other Special Instructions Will Be Listed Below (If  Applicable).       Signed, Jodelle Red, MD PhD 07/09/2018 1:21 PM    [facility]

## 2018-07-10 ENCOUNTER — Encounter (INDEPENDENT_AMBULATORY_CARE_PROVIDER_SITE_OTHER): Payer: Self-pay | Admitting: Physician Assistant

## 2018-07-10 ENCOUNTER — Other Ambulatory Visit: Payer: Self-pay

## 2018-07-10 ENCOUNTER — Ambulatory Visit (INDEPENDENT_AMBULATORY_CARE_PROVIDER_SITE_OTHER): Payer: Medicaid Other | Admitting: Physician Assistant

## 2018-07-10 VITALS — BP 174/68 | HR 103 | Temp 98.6°F | Ht 68.0 in | Wt 297.6 lb

## 2018-07-10 DIAGNOSIS — R7989 Other specified abnormal findings of blood chemistry: Secondary | ICD-10-CM

## 2018-07-10 DIAGNOSIS — N529 Male erectile dysfunction, unspecified: Secondary | ICD-10-CM

## 2018-07-10 NOTE — Progress Notes (Signed)
Subjective:  Patient ID: Thomas Medina, male    DOB: 09-Jul-1956  Age: 62 y.o. MRN: [id number]  CC: erectile dyfunction  HPI  Thomas Medina a 62 y.o.malewith a medical history ofOSA, HTN,HLD,DM2, andbilateraladhesive capsulitis presents with concern for erectile dysfunction. Says he may have an erection but would take a lot of sexual stimulation for an erection to happen. Rarely has morning erections. Has taken sildenafil 100 mg with moderate improvement of erection. Pt believes his weight is largely contributing to ED. Says he is trying to lose weight and has adjusted his diet. Also has plans to go to the gym.  Work up last year revealed low testosterone at 238 ng/dL. Had a normal FSH/LH and TSH. Taking anti-hypertensives to include B blocker and Diuretic. Taking SNRI. Denies abnormal curvature of penis.    Pt has rare morning erection  Outpatient Medications Prior to Visit  Medication Sig Dispense Refill  . DULoxetine (CYMBALTA) 20 MG capsule Take 2 capsules (40 mg total) by mouth daily. 60 capsule 11  . furosemide (LASIX) 40 MG tablet Take 1.5 tablets (60 mg total) by mouth daily as needed. 45 tablet 5  . hydrOXYzine (ATARAX/VISTARIL) 50 MG tablet Take 50 mg by mouth at bedtime.    Marland Kitchen ibuprofen (ADVIL,MOTRIN) 600 MG tablet Take 1 tablet (600 mg total) by mouth every 8 (eight) hours as needed. 30 tablet 1  . losartan (COZAAR) 100 MG tablet Take 1 tablet (100 mg total) by mouth daily. 30 tablet 11  . lovastatin (MEVACOR) 20 MG tablet Take 1 tablet (20 mg total) by mouth at bedtime. 30 tablet 11  . metFORMIN (GLUCOPHAGE-XR) 750 MG 24 hr tablet Take 750 mg by mouth daily with breakfast.    . metoprolol succinate (TOPROL-XL) 50 MG 24 hr tablet TAKE 1 TABLET BY MOUTH DAILY. TAKE WITH OR IMMEDIATELY FOLLOWING A MEAL. 30 tablet 11  . sildenafil (VIAGRA) 100 MG tablet Take 1 tablet (100 mg total) by mouth daily as needed for erectile dysfunction. 10 tablet 11  . omega-3 acid ethyl esters  (LOVAZA) 1 g capsule Take 2 capsules (2 g total) by mouth 2 (two) times daily. (Patient not taking: Reported on 07/09/2018) 120 capsule 5   No facility-administered medications prior to visit.      ROS Review of Systems  Constitutional: Negative for chills, fever and malaise/fatigue.  Eyes: Negative for blurred vision.  Respiratory: Negative for shortness of breath.   Cardiovascular: Negative for chest pain and palpitations.  Gastrointestinal: Negative for abdominal pain and nausea.  Genitourinary: Negative for dysuria and hematuria.       Erectile dysfunction.  Musculoskeletal: Negative for joint pain and myalgias.  Skin: Negative for rash.  Neurological: Negative for tingling and headaches.  Psychiatric/Behavioral: Negative for depression. The patient is not nervous/anxious.     Objective:  BP (!) 174/68 (BP Location: Left Arm, Patient Position: Sitting, Cuff Size: Large)   Pulse (!) 103   Temp 98.6 F (37 C) (Oral)   Ht 5\' 8"  (1.727 m)   Wt 297 lb 9.6 oz (135 kg)   SpO2 91%   BMI 45.25 kg/m   BP/Weight 07/10/2018 07/09/2018 07/05/2018  Systolic BP 174 111 154  Diastolic BP 68 55 72  Wt. (Lbs) 297.6 296.8 297.8  BMI 45.25 45.13 45.28      Physical Exam  Constitutional: He is oriented to person, place, and time.  Well developed, morbidly obese, NAD, polite  HENT:  Head: Normocephalic and atraumatic.  Eyes: No scleral icterus.  Neck: Normal range of motion. Neck supple. No thyromegaly present.  Pulmonary/Chest: Effort normal.  Genitourinary:  Genitourinary Comments: Hydrocele bilaterally, no testicular mass bilaterally, no penile scarring.   Musculoskeletal: He exhibits no edema.  Neurological: He is alert and oriented to person, place, and time.  Skin: Skin is warm and dry. No rash noted. No erythema. No pallor.  Psychiatric: He has a normal mood and affect. His behavior is normal. Thought content normal.  Vitals reviewed.    Assessment & Plan:   1.  Erectile dysfunction, unspecified erectile dysfunction type - Low testosterone at 238 ng/dL. Had a normal FSH and TSH. Multifactorial to include low T, anti-hypertensive drug use, SNRI use, chronically uncontrolled HTN, and many years of tobacco use. Pt asked to stopped smoking and keep control of BP.  - Prolactin; Future - PSA; Future - May continue Sildenafil 100 mg as directed.   2. Low testosterone in male - Likely attributed to age and morbid obesity.  3. Morbid obesity (HCC) - Pt dieting and plans to exercise at the gym. Losing weight will help increase testosterone level.   Follow-up: Return if symptoms worsen or fail to improve.   Loletta Specter PA

## 2018-07-10 NOTE — Patient Instructions (Signed)

## 2018-07-11 MED FILL — LOSARTAN POTASSIUM 100 MG T: 100 | 30 days supply | Qty: 30 | Fill #3

## 2018-07-11 MED FILL — $VIAGRA 100 MG TABLET: 100 | 90 days supply | Qty: 30 | Fill #0

## 2018-07-11 MED FILL — LOVASTATIN 20 MG TABLET: 20 | 30 days supply | Qty: 30 | Fill #3

## 2018-07-11 MED FILL — METOPROLOL SUCCINATE ER 50: 50 | 30 days supply | Qty: 90 | Fill #2

## 2018-08-02 ENCOUNTER — Other Ambulatory Visit: Payer: Self-pay

## 2018-08-02 ENCOUNTER — Encounter (INDEPENDENT_AMBULATORY_CARE_PROVIDER_SITE_OTHER): Payer: Self-pay | Admitting: Physician Assistant

## 2018-08-02 ENCOUNTER — Ambulatory Visit (INDEPENDENT_AMBULATORY_CARE_PROVIDER_SITE_OTHER): Payer: Medicaid Other | Admitting: Physician Assistant

## 2018-08-02 VITALS — BP 129/64 | HR 95 | Temp 98.1°F | Ht 68.0 in | Wt 287.8 lb

## 2018-08-02 DIAGNOSIS — R600 Localized edema: Secondary | ICD-10-CM | POA: Diagnosis not present

## 2018-08-02 MED ORDER — HYDROCHLOROTHIAZIDE 12.5 MG PO TABS: 12.5000 mg | ORAL_TABLET | Freq: Every day | ORAL | 3 refills | Status: DC

## 2018-08-02 MED FILL — FUROSEMIDE 40 MG TAB: 40 | 30 days supply | Qty: 45 | Fill #1

## 2018-08-02 NOTE — Progress Notes (Signed)
Subjective:  Patient ID: Thomas Medina Companionnthony Medina, male    DOB: 28-Jun-1956  Age: 62 y.o. MRN: [id number]  CC: LE edema  HPI Aleda Grananthony Joyneris a 62 y.o.malewith a medical history ofOSA, HTN,HLD,DM2,andbilateraladhesive capsulitis presents with bilateral LE edema. BP 174/68 mmHg three weeks ago. Taking Losartan and Metoprolol as directed. Has run out of Lasix. BP 153/71 mmHg initially and BP 129/64 mmHg on retake. Says his BP was 120 this morning. Thinks his blood pressure was initially elevated due to eating a bag of chips this morning. Does not endorse any other symptoms or complaints.      Outpatient Medications Prior to Visit  Medication Sig Dispense Refill  . DULoxetine (CYMBALTA) 20 MG capsule Take 2 capsules (40 mg total) by mouth daily. 60 capsule 11  . hydrOXYzine (ATARAX/VISTARIL) 50 MG tablet Take 50 mg by mouth at bedtime.    Marland Kitchen. ibuprofen (ADVIL,MOTRIN) 600 MG tablet Take 1 tablet (600 mg total) by mouth every 8 (eight) hours as needed. 30 tablet 1  . losartan (COZAAR) 100 MG tablet Take 1 tablet (100 mg total) by mouth daily. 30 tablet 11  . lovastatin (MEVACOR) 20 MG tablet Take 1 tablet (20 mg total) by mouth at bedtime. 30 tablet 11  . metFORMIN (GLUCOPHAGE-XR) 750 MG 24 hr tablet Take 750 mg by mouth daily with breakfast.    . metoprolol succinate (TOPROL-XL) 50 MG 24 hr tablet TAKE 1 TABLET BY MOUTH DAILY. TAKE WITH OR IMMEDIATELY FOLLOWING A MEAL. 30 tablet 11  . omega-3 acid ethyl esters (LOVAZA) 1 g capsule Take 2 capsules (2 g total) by mouth 2 (two) times daily. 120 capsule 5  . sildenafil (VIAGRA) 100 MG tablet Take 1 tablet (100 mg total) by mouth daily as needed for erectile dysfunction. 10 tablet 11  . BELBUCA 450 MCG FILM TK 1 FILM INSIDE CHEEKS Q 12 H  0  . furosemide (LASIX) 40 MG tablet Take 1.5 tablets (60 mg total) by mouth daily as needed. (Patient not taking: Reported on 08/02/2018) 45 tablet 5  . oxyCODONE (OXY IR/ROXICODONE) 5 MG immediate release tablet TK  1 T PO TID PRN  0   No facility-administered medications prior to visit.      ROS Review of Systems  Constitutional: Negative for chills, fever and malaise/fatigue.  Eyes: Negative for blurred vision.  Respiratory: Negative for shortness of breath.   Cardiovascular: Negative for chest pain and palpitations.  Gastrointestinal: Negative for abdominal pain and nausea.  Genitourinary: Negative for dysuria and hematuria.  Musculoskeletal: Negative for joint pain and myalgias.  Skin: Negative for rash.  Neurological: Negative for tingling and headaches.  Psychiatric/Behavioral: Negative for depression. The patient is not nervous/anxious.     Objective:  Ht 5\' 8"  (1.727 m)   Wt 287 lb 12.8 oz (130.5 kg)   BMI 43.76 kg/m   Vitals:   08/02/18 1336  BP: (!) 153/71  Pulse: 95  Temp: 98.1 F (36.7 C)  TempSrc: Oral  SpO2: 94%  Weight: 287 lb 12.8 oz (130.5 kg)  Height: 5\' 8"  (1.727 m)      Physical Exam  Constitutional: He is oriented to person, place, and time.  Well developed, well nourished, NAD, polite  HENT:  Head: Normocephalic and atraumatic.  Eyes: No scleral icterus.  Neck: Normal range of motion. Neck supple. No thyromegaly present.  Cardiovascular: Normal rate, regular rhythm and normal heart sounds.  Pulmonary/Chest: Effort normal and breath sounds normal.  Abdominal: Soft. Bowel sounds are normal. There is no  tenderness.  Musculoskeletal: He exhibits no edema.  Neurological: He is alert and oriented to person, place, and time.  Skin: Skin is warm and dry. No rash noted. No erythema. No pallor.  Psychiatric: He has a normal mood and affect. His behavior is normal. Thought content normal.  Vitals reviewed.    Assessment & Plan:   1. Bilateral lower extremity edema - Ran out of Lasix. Was using Lasix PRN. I have advised to stop Lasix and to begin regularly taking HCTZ 12.5 mg. - Begin HCTZ 12.5 mg - Call in two weeks to update me on LE and BP.    Meds  ordered this encounter  Medications  . hydrochlorothiazide (HYDRODIURIL) 12.5 MG tablet    Sig: Take 1 tablet (12.5 mg total) by mouth daily.    Dispense:  90 tablet    Refill:  3    Order Specific Question:   Supervising Provider    Answer:   Hoy Register [[id number]]    Follow-up: Return in about 3 months (around 11/02/2018), or if symptoms worsen or fail to improve, for HTN.   Loletta Specter PA

## 2018-08-02 NOTE — Patient Instructions (Signed)
Edema Edema is when you have too much fluid in your body or under your skin. Edema may make your legs, feet, and ankles swell up. Swelling is also common in looser tissues, like around your eyes. This is a common condition. It gets more common as you get older. There are many possible causes of edema. Eating too much salt (sodium) and being on your feet or sitting for a long time can cause edema in your legs, feet, and ankles. Hot weather may make edema worse. Edema is usually painless. Your skin may look swollen or shiny. Follow these instructions at home:  Keep the swollen body part raised (elevated) above the level of your heart when you are sitting or lying down.  Do not sit still or stand for a long time.  Do not wear tight clothes. Do not wear garters on your upper legs.  Exercise your legs. This can help the swelling go down.  Wear elastic bandages or support stockings as told by your doctor.  Eat a low-salt (low-sodium) diet to reduce fluid as told by your doctor.  Depending on the cause of your swelling, you may need to limit how much fluid you drink (fluid restriction).  Take over-the-counter and prescription medicines only as told by your doctor. Contact a doctor if:  Treatment is not working.  You have heart, liver, or kidney disease and have symptoms of edema.  You have sudden and unexplained weight gain. Get help right away if:  You have shortness of breath or chest pain.  You cannot breathe when you lie down.  You have pain, redness, or warmth in the swollen areas.  You have heart, liver, or kidney disease and get edema all of a sudden.  You have a fever and your symptoms get worse all of a sudden. Summary  Edema is when you have too much fluid in your body or under your skin.  Edema may make your legs, feet, and ankles swell up. Swelling is also common in looser tissues, like around your eyes.  Raise (elevate) the swollen body part above the level of your  heart when you are sitting or lying down.  Follow your doctor's instructions about diet and how much fluid you can drink (fluid restriction). This information is not intended to replace advice given to you by your health care provider. Make sure you discuss any questions you have with your health care provider. Document Released: 02/15/2008 Document Revised: 09/16/2016 Document Reviewed: 09/16/2016 Elsevier Interactive Patient Education  2017 [employer]  

## 2018-08-07 ENCOUNTER — Encounter: Payer: Medicaid Other | Attending: Physician Assistant | Admitting: Registered"

## 2018-08-07 ENCOUNTER — Encounter: Payer: Self-pay | Admitting: Registered"

## 2018-08-07 DIAGNOSIS — E669 Obesity, unspecified: Secondary | ICD-10-CM

## 2018-08-07 DIAGNOSIS — I10 Essential (primary) hypertension: Secondary | ICD-10-CM

## 2018-08-07 DIAGNOSIS — Z713 Dietary counseling and surveillance: Secondary | ICD-10-CM | POA: Diagnosis not present

## 2018-08-07 MED FILL — LOVASTATIN 20 MG TABLET: 20 | 30 days supply | Qty: 30 | Fill #4

## 2018-08-07 MED FILL — METOPROLOL SUCCINATE ER 50: 50 | 30 days supply | Qty: 90 | Fill #3

## 2018-08-07 MED FILL — LOSARTAN POTASSIUM 100 MG T: 100 | 30 days supply | Qty: 30 | Fill #4

## 2018-08-07 NOTE — Patient Instructions (Addendum)
-   Add protein to breakfast such as eggs.   [facility]- Contact [facility].

## 2018-08-07 NOTE — Progress Notes (Signed)
Medical Nutrition Therapy:  Appt start time: 2:50 end time: 3:12.   Assessment:  Primary concerns today: Pt states he has a hernia and wants to lose weight.    Pt expectations: knowledge of learning to lose weight, what he should eat and shouldn't eat, and help with healthy attitude  Pt arrives stating he has lost 12 lbs. Pt states he saw a man on TV who lost 200 lbs in 2 months by juicing fruits and vegetables and including walking with that. Pt states he is interested in something similar but also wants to add in eating his meals.   Pt states he wants to lose weight healthily. Pt states he has started eating oatmeal and prunes. Pt states he has not contacted fitness center yet.    Pt states he has a daughter and 3 granddaughters who live locally. Pt states he has depression and PTSD. Pt states he sees a therapist once a month and also taking medications. Pt states he has hypertension.   Pt states he has been eating more vegetables with meals. Pt states he has been able to have 3 meals a day. Pt states he takes steps at home for physical activity. Pt is interested in going to a fitness center for physical activity because he states he does not do anything during the day other than go to appointments. Pt expresses concern for housing because he will need to move soon because daughter is being deployed.   Pt states he has been eating whole wheat bread, brown rice, and does not eat a lot of bread. Pt states he has been avoiding potatoes although he loves them. Pt states he has started using sea salt instead of iodized salt.   Previous visit: Pt states sometimes he expects results too fast. Pt states he has limited finances because he does not have income at this time. Pt states he wants to discuss portions and sometimes he overeats because his food is so good.   Next visit: physical activity, fluid intake, portion sizes, review previous goals  Preferred Learning Style:   No preference  indicated   Learning Readiness:   Contemplating  Ready  Change in progress   MEDICATIONS: See list   DIETARY INTAKE:  Usual eating pattern includes 1-3 meals and 1-2 snacks per day.  Everyday foods include fruit, fruit juice, processed foods, alcohol, chicken, vegetables and ginger ale.  Avoided foods include red meat.    24-hr recall:  B ( AM): grapes + banana + 1/2 bagel + water + OJ Snk ( AM): none  L ( PM): Smart Meal-chicken + shrimp + spinach  Snk ( PM): prunes D ( PM): [place] pot pie  Snk ( PM): sometimes grapes Beverages: juice, beer (2/day), ginger ale, soda (rarely), water  Usual physical activity: walking 20-30 min, 3 days/week and takes steps  Estimated energy needs: 2000 calories 225 g carbohydrates 150 g protein 56 g fat  Progress Towards Goal(s):  In progress.   Nutritional Diagnosis:  NB-3.2 Limited access to food or water As related to  lack of financial resources ti purchase sufficient quanitity or variety of healthful foods.  As evidenced by pt reports not having enough money for food at times.    Intervention:  Nutrition education and counseling. Pt was educated and counseled healthy weight loss, diet culture, balanced meals, benefits of increased fruit and vegetables, and discussed importance of physical activity. Pt was in agreement with goals listed.  Goals: - Add protein to breakfast such  as eggs.  [facility]- Contact [facility].     Teaching Method Utilized:  Visual Auditory Hands on  Handouts given during visit include:  none  Barriers to learning/adherence to lifestyle change: limited finances  Demonstrated degree of understanding via:  Teach Back   Monitoring/Evaluation:  Dietary intake, exercise, and body weight in 1 month(s).

## 2018-09-10 ENCOUNTER — Encounter: Payer: Medicaid Other | Attending: Physician Assistant | Admitting: Registered"

## 2018-09-10 ENCOUNTER — Encounter: Payer: Self-pay | Admitting: Registered"

## 2018-09-10 DIAGNOSIS — E669 Obesity, unspecified: Secondary | ICD-10-CM

## 2018-09-10 DIAGNOSIS — Z713 Dietary counseling and surveillance: Secondary | ICD-10-CM | POA: Diagnosis not present

## 2018-09-10 NOTE — Patient Instructions (Addendum)
-   Can snack on fruit + nuts or fruit + cheese after dinner if needed.   - You're doing great! Keep up the great work!

## 2018-09-10 NOTE — Progress Notes (Signed)
  Medical Nutrition Therapy:  Appt start time: 11:15 end time: 11:35   Assessment:  Primary concerns today: Pt states he has a hernia and wants to lose weight.    Pt expectations: knowledge of learning to lose weight, what he should eat and shouldn't eat, and help with healthy attitude  Pt states he has been working out at [employer]: biking + Editor, commissioning. Pt arrives stating he has lost 12 lbs. Pt states he tries not to eat after 6pm and goes to bed between 10-1am. Pt has been making great behavioral changes and states he feels good.   Pt states he has a daughter and 3 granddaughters who live locally. Pt states he has depression and PTSD. Pt states he sees a therapist once a month and also taking medications. Pt states he has hypertension.   Pt expresses concern for housing because he will need to move soon because daughter is being deployed.   Previous visits: Pt states sometimes he expects results too fast. Pt states he has limited finances because he does not have income at this time. Pt states he wants to discuss portions and sometimes he overeats because his food is so good.   Next visit: beverage options, review previous goals  Preferred Learning Style:   No preference indicated   Learning Readiness:   Contemplating  Ready  Change in progress   MEDICATIONS: See list   DIETARY INTAKE:  Usual eating pattern includes 1-3 meals and 1-2 snacks per day.  Everyday foods include fruit, fruit juice, processed foods, alcohol, chicken, vegetables and ginger ale.  Avoided foods include red meat.    24-hr recall:  B ( AM): eggs + [place] bacon/sausage + fruit Snk ( AM): none  L ( PM): Smart Meal-chicken + shrimp + spinach or sandwich or salad with chicken/tuna + rolls Snk ( PM): prunes D (by 6 PM): chicken/fish + mac and cheese + vegetables  Snk ( PM): sometimes grapes Beverages: juice, beer (2/day), ginger ale (rarely), water  Usual physical activity: cardio +  strength training a couple of hours, 3 days/week   Estimated energy needs: 2000 calories 225 g carbohydrates 150 g protein 56 g fat  Progress Towards Goal(s):  In progress.   Nutritional Diagnosis:  NB-3.2 Limited access to food or water As related to  lack of financial resources ti purchase sufficient quanitity or variety of healthful foods.  As evidenced by pt reports not having enough money for food at times.    Intervention:  Nutrition education and counseling. Pt was encouraged on his recent behavioral changes related to balanced meals, increasing vegetable intake, and increasing physical activity. Pt was in agreement with goals listed.  Goals: - Can snack on fruit + nuts or fruit + cheese after dinner if needed.  - You're doing great! Keep up the great work!  Teaching Method Utilized:  Visual Auditory Hands on  Handouts given during visit include:  none  Barriers to learning/adherence to lifestyle change: limited finances  Demonstrated degree of understanding via:  Teach Back   Monitoring/Evaluation:  Dietary intake, exercise, and body weight in 1 month(s).

## 2018-09-13 MED FILL — METOPROLOL SUCCINATE ER 50: 50 | 30 days supply | Qty: 30 | Fill #0

## 2018-09-13 MED FILL — LOSARTAN POTASSIUM 100 MG T: 100 | 30 days supply | Qty: 30 | Fill #5

## 2018-09-13 MED FILL — LOVASTATIN 20 MG TABLET: 20 | 30 days supply | Qty: 30 | Fill #5

## 2018-09-13 MED FILL — FUROSEMIDE 40 MG TAB: 40 | 30 days supply | Qty: 45 | Fill #2

## 2018-10-03 ENCOUNTER — Ambulatory Visit (INDEPENDENT_AMBULATORY_CARE_PROVIDER_SITE_OTHER): Payer: Medicaid Other | Admitting: Orthopedic Surgery

## 2018-10-03 ENCOUNTER — Encounter (INDEPENDENT_AMBULATORY_CARE_PROVIDER_SITE_OTHER): Payer: Self-pay | Admitting: Orthopedic Surgery

## 2018-10-03 VITALS — Ht 68.0 in | Wt 287.8 lb

## 2018-10-03 DIAGNOSIS — M19012 Primary osteoarthritis, left shoulder: Secondary | ICD-10-CM | POA: Diagnosis not present

## 2018-10-03 DIAGNOSIS — M19011 Primary osteoarthritis, right shoulder: Secondary | ICD-10-CM | POA: Diagnosis not present

## 2018-10-03 DIAGNOSIS — M7551 Bursitis of right shoulder: Secondary | ICD-10-CM | POA: Diagnosis not present

## 2018-10-08 ENCOUNTER — Encounter (INDEPENDENT_AMBULATORY_CARE_PROVIDER_SITE_OTHER): Payer: Self-pay | Admitting: Orthopedic Surgery

## 2018-10-08 ENCOUNTER — Ambulatory Visit: Payer: Medicaid Other | Admitting: Registered"

## 2018-10-08 NOTE — Progress Notes (Signed)
Office Visit Note   Patient: Thomas Medina           Date of Birth: 1956-01-29           MRN: [id number] Visit Date: 10/03/2018 Requested by: Loletta SpecterGomez, Roger David, PA-C No address on file PCP: Loletta SpecterGomez, Roger David, PA-C  Subjective: Chief Complaint  Patient presents with  . Right Shoulder - Pain, Follow-up    S/p injection 04/25/18    HPI: Thomas Medina is a patient with right AC joint pain.  Had injection 04/25/2018.  Injection helped for a day or so.  He is considering surgery.  Patient states his shoulder feels a little bit tighter.  MRI scan 219 demonstrates tendinopathy as well as AC joint arthritis and bursitis.  In general the patient wants to give this a little bit more time.  He has been working out.  [facility]              ROS: All systems reviewed are negative as they relate to the chief complaint within the history of present illness.  Patient denies  fevers or chills.   Assessment & Plan: Visit Diagnoses:  1. Arthritis of both acromioclavicular joints     Plan: Impression is right shoulder arthritis and AC joint arthritis and bursitis.  Discussed injection versus surgery versus observation.  Patient wants to give this a little bit longer to work itself out.  Would consider topical in addition to over-the-counter medication.  If his symptoms recur he should come back for further intervention.  I will see him back as needed  Follow-Up Instructions: Return if symptoms worsen or fail to improve.   Orders:  No orders of the defined types were placed in this encounter.  No orders of the defined types were placed in this encounter.     Procedures: No procedures performed   Clinical Data: No additional findings.  Objective: Vital Signs: Ht 5\' 8"  (1.727 m)   Wt 287 lb 12.8 oz (130.5 kg)   BMI 43.76 kg/m   Physical Exam:   Constitutional: Patient appears well-developed HEENT:  Head: Normocephalic Eyes:EOM are normal Neck: Normal  range of motion Cardiovascular: Normal rate Pulmonary/chest: Effort normal Neurologic: Patient is alert Skin: Skin is warm Psychiatric: Patient has normal mood and affect    Ortho Exam: Ortho exam demonstrates full active and passive range of motion of the shoulder with good rotator cuff strength and AC joint tenderness on the right but not the left.  O'Brien's testing is negative.  Negative apprehension relocation testing.  Cervical spine range of motion is full.  Rotator cuff strength is symmetric.  Specialty Comments:  No specialty comments available.  Imaging: No results found.   PMFS History: Patient Active Problem List   Diagnosis Date Noted  . Tobacco dependence 07/09/2018  . Localized edema 07/09/2018  . Class 3 severe obesity due to excess calories without serious comorbidity with body mass index (BMI) of 45.0 to 49.9 in adult (HCC) 07/09/2018  . Mixed hyperlipidemia 03/29/2017  . Hypertension 03/27/2017   Past Medical History:  Diagnosis Date  . Depression   . Hypertension   . PTSD (post-traumatic stress disorder)   . Sleep apnea   . Vision abnormalities     Family History  Problem Relation Age of Onset  . Aneurysm Mother   . Heart attack Father   . Hypertension Father   . Hyperlipidemia Father   . Renal cancer Brother   . Kidney disease  Brother     Past Surgical History:  Procedure Laterality Date  . POLYPECTOMY     Social History   Occupational History  . Not on file  Tobacco Use  . Smoking status: Current Every Day Smoker    Packs/day: 0.25    Types: Cigarettes  . Smokeless tobacco: Never Used  Substance and Sexual Activity  . Alcohol use: Yes    Comment: occ  . Drug use: No  . Sexual activity: Not on file

## 2018-10-12 ENCOUNTER — Ambulatory Visit: Payer: Medicaid Other | Admitting: Cardiology

## 2018-10-12 ENCOUNTER — Encounter: Payer: Self-pay | Admitting: Cardiology

## 2018-10-12 VITALS — BP 140/62 | HR 92 | Ht 69.0 in | Wt 288.0 lb

## 2018-10-12 DIAGNOSIS — Z716 Tobacco abuse counseling: Secondary | ICD-10-CM

## 2018-10-12 DIAGNOSIS — Z7189 Other specified counseling: Secondary | ICD-10-CM

## 2018-10-12 DIAGNOSIS — R600 Localized edema: Secondary | ICD-10-CM

## 2018-10-12 DIAGNOSIS — I10 Essential (primary) hypertension: Secondary | ICD-10-CM | POA: Diagnosis not present

## 2018-10-12 DIAGNOSIS — Z6841 Body Mass Index (BMI) 40.0 and over, adult: Secondary | ICD-10-CM

## 2018-10-12 DIAGNOSIS — R059 Cough, unspecified: Secondary | ICD-10-CM

## 2018-10-12 DIAGNOSIS — E66813 Obesity, class 3: Secondary | ICD-10-CM

## 2018-10-12 DIAGNOSIS — R05 Cough: Secondary | ICD-10-CM

## 2018-10-12 DIAGNOSIS — E782 Mixed hyperlipidemia: Secondary | ICD-10-CM

## 2018-10-12 NOTE — Progress Notes (Signed)
Cardiology Office Note:    Date:  10/12/2018   ID:  Thomas Medina, DOB 10/14/1955, MRN [id number]  PCP:  Loletta Specter, PA-C  Cardiologist:  Jodelle Red, MD PhD  Referring MD: Loletta Specter, PA-C   CC: follow up LE edema  History of Present Illness:    Thomas Medina is a 63 y.o. male with a hx of type II diabetes, hypertension, sleep apnea who is seen in follow up today for LE edema. He was seen as a new consult on as a new consult on 04/06/18. At that time, noted about 7 mos of stable LE edema. Had echo, no systolic function, no significant valvular disease, only grade 1 diastolic dysfunction.  Today: losing weight intentionally. Working on exercise, diet. He continues to have an intermittent cough, productive of clear/white sputum. No fevers/chills, no sick contacts. No known allergies. LE edema is improving. Compression stockings working well. Furosemide changed to HCTZ, doing well on this. Cutting back on smoking, down to 2-3 cigarettes/day and some days doesn't smoke at all. Using BiPAP at night, though not every night (when he is coughing he can't use it). Has an albuterol inhaler at home, doesn't use regularly. Had PFTs done about two weeks ago, which is when he was started on at that time. Sees Dr. Gerome Apley at [facility] for this.  Denies chest pain, shortness of breath at rest or with normal exertion. No PND, orthopnea, or unexpected weight gain. No syncope or palpitations.   Past Medical History:  Diagnosis Date  . Depression   . Hypertension   . PTSD (post-traumatic stress disorder)   . Sleep apnea   . Vision abnormalities     Past Surgical History:  Procedure Laterality Date  . POLYPECTOMY      Current Medications: Current Outpatient Medications on File Prior to Visit  Medication Sig  . BELBUCA 450 MCG FILM TK 1 FILM INSIDE CHEEKS Q 12 H  . DULoxetine (CYMBALTA) 20 MG capsule Take 2 capsules (40 mg total) by mouth daily.  .  hydrochlorothiazide (HYDRODIURIL) 12.5 MG tablet Take 1 tablet (12.5 mg total) by mouth daily.  . hydrOXYzine (ATARAX/VISTARIL) 50 MG tablet Take 50 mg by mouth at bedtime.  Marland Kitchen ibuprofen (ADVIL,MOTRIN) 600 MG tablet Take 1 tablet (600 mg total) by mouth every 8 (eight) hours as needed.  Marland Kitchen losartan (COZAAR) 100 MG tablet Take 1 tablet (100 mg total) by mouth daily.  Marland Kitchen lovastatin (MEVACOR) 20 MG tablet Take 1 tablet (20 mg total) by mouth at bedtime.  . metFORMIN (GLUCOPHAGE-XR) 750 MG 24 hr tablet Take 750 mg by mouth daily with breakfast.  . metoprolol succinate (TOPROL-XL) 50 MG 24 hr tablet TAKE 1 TABLET BY MOUTH DAILY. TAKE WITH OR IMMEDIATELY FOLLOWING A MEAL.  Marland Kitchen omega-3 acid ethyl esters (LOVAZA) 1 g capsule Take 2 capsules (2 g total) by mouth 2 (two) times daily.  Marland Kitchen oxyCODONE (OXY IR/ROXICODONE) 5 MG immediate release tablet TK 1 T PO TID PRN  . sildenafil (VIAGRA) 100 MG tablet Take 1 tablet (100 mg total) by mouth daily as needed for erectile dysfunction.  . SYMBICORT 160-4.5 MCG/ACT inhaler INHALE TWO PUFFS INTO THE LUNGS TWICE DAILY  . topiramate (TOPAMAX) 50 MG tablet Take 50 mg by mouth every evening.  . phentermine (ADIPEX-P) 37.5 MG tablet TK 1 T PO QD   No current facility-administered medications on file prior to visit.     Allergies:   Patient has no known allergies.   Social History  Socioeconomic History  . Marital status: Divorced    Spouse name: Not on file  . Number of children: Not on file  . Years of education: Not on file  . Highest education level: Not on file  Occupational History  . Not on file  Social Needs  . Financial resource strain: Not on file  . Food insecurity:    Worry: Sometimes true    Inability: Sometimes true  . Transportation needs:    Medical: Not on file    Non-medical: Not on file  Tobacco Use  . Smoking status: Current Every Day Smoker    Packs/day: 0.25    Types: Cigarettes  . Smokeless tobacco: Never Used  Substance and Sexual  Activity  . Alcohol use: Yes    Comment: occ  . Drug use: No  . Sexual activity: Not on file  Lifestyle  . Physical activity:    Days per week: Not on file    Minutes per session: Not on file  . Stress: Not on file  Relationships  . Social connections:    Talks on phone: Not on file    Gets together: Not on file    Attends religious service: Not on file    Active member of club or organization: Not on file    Attends meetings of clubs or organizations: Not on file    Relationship status: Not on file  Other Topics Concern  . Not on file  Social History Narrative  . Not on file     Family History: The patient's family history includes Aneurysm in his mother; Heart attack in his father; Hyperlipidemia in his father; Hypertension in his father; Kidney disease in his brother; Renal cancer in his brother. Brother had bioprosthetic valve replacement. Father passed from heart attack at age 41. Both of his father's brothers passed away from MI.  ROS:   Please see the history of present illness.  Additional pertinent ROS: Review of Systems  Constitutional: Negative for chills, fever, malaise/fatigue and weight loss.  HENT: Negative for ear pain and hearing loss.   Eyes: Negative for blurred vision and pain.  Respiratory: Positive for cough and sputum production. Negative for hemoptysis, shortness of breath and wheezing.   Cardiovascular: Positive for leg swelling. Negative for chest pain, palpitations, orthopnea, claudication and PND.  Gastrointestinal: Negative for abdominal pain, blood in stool, melena, nausea and vomiting.  Genitourinary: Negative for dysuria and hematuria.  Musculoskeletal: Positive for back pain and myalgias. Negative for falls.  Skin: Negative for itching and rash.  Neurological: Negative for focal weakness, loss of consciousness and headaches.  Endo/Heme/Allergies: Does not bruise/bleed easily.   EKGs/Labs/Other Studies Reviewed:    The following studies were  reviewed today: Echo 04/12/18 Study Conclusions  - Left ventricle: The cavity size was normal. Wall thickness was   increased in a pattern of mild LVH. Systolic function was   vigorous. The estimated ejection fraction was in the range of 65%   to 70%. Wall motion was normal; there were no regional wall   motion abnormalities. Doppler parameters are consistent with   abnormal left ventricular relaxation (grade 1 diastolic   dysfunction). The E/e&' ratio is >15, suggesting elevated LV   filling pressure. - Mitral valve: Mildly thickened leaflets . There was trivial   regurgitation. - Left atrium: The atrium was normal in size. - Right atrium: The atrium was mildly dilated. - Tricuspid valve: There was trivial regurgitation. - Pulmonary arteries: PA peak pressure: 12 mm Hg (  S). - Inferior vena cava: The vessel was normal in size. The   respirophasic diameter changes were in the normal range (= 50%),   consistent with normal central venous pressure.  Impressions:  - LVEF 65-70%, mild LVH, normal wall motion, grade 1 DD, elevated   LV filling pressure, trivial MR, normal LA size, mild RAE,   trivial TR, RVSP 12 mmHg, normal IVC.  EKG:  The ekg ordered previously demonstrates normal sinus rhythm with nonspecific ST changes (slight PR depression and concave st changes)  Recent Labs: 02/21/2018: BNP 9.6 03/22/2018: Hemoglobin 14.6 04/03/2018: ALT 68; BUN 11; Creatinine, Ser 0.85; Potassium 4.9; Sodium 140  Recent Lipid Panel    Component Value Date/Time   CHOL 168 04/03/2018 1132   TRIG 218 (H) 04/03/2018 1132   HDL 32 (L) 04/03/2018 1132   CHOLHDL 5.3 (H) 04/03/2018 1132   LDLCALC 92 04/03/2018 1132    Physical Exam:    VS:  BP 140/62   Pulse 92   Ht 5\' 9"  (1.753 m)   Wt 288 lb (130.6 kg)   BMI 42.53 kg/m     Wt Readings from Last 3 Encounters:  10/12/18 288 lb (130.6 kg)  10/03/18 287 lb 12.8 oz (130.5 kg)  08/02/18 287 lb 12.8 oz (130.5 kg)    GEN: Well nourished,  well developed in no acute distress HEENT: Normal NECK: JVD flat; No carotid bruits LYMPHATICS: No lymphadenopathy CARDIAC: regular rhythm, normal S1 and S2, no murmurs, rubs, gallops. Radial and DP pulses 2+ bilaterally. RESPIRATORY:  Clear to auscultation without rales, wheezing or rhonchi  ABDOMEN: Soft, non-tender, non-distended. Abdominal hernia. MUSCULOSKELETAL:  Bilateral trace, mostly nonpitting edema. Compression stockings in place. No deformity  SKIN: Warm and dry NEUROLOGIC:  Alert and oriented x 3 PSYCHIATRIC:  Normal affect   ASSESSMENT:    1. Bilateral leg edema   2. Tobacco abuse counseling   3. Counseling on health promotion and disease prevention   4. Essential hypertension   5. Cough   6. Mixed hyperlipidemia   7. Class 3 severe obesity due to excess calories with serious comorbidity and body mass index (BMI) of 40.0 to 44.9 in adult [facility](HCC)    PLAN:    1. Chronic bilateral lower extremity edema: resolving, nearly gone today -echo with normal systolic function, no significant valve issues, only grade 1 diastolic dysfunction. BNP only 9.6, but may be somewhat falsely low due to obesity. -counseled on dietary recommendations, including salt avoidance.  -continue compression stockings, elevation when not in use -I suspect this is salt retention and venous insufficiency primarily.  TSH normal, no protein in urine, LFTs largely unremarkable except for mild transaminase elevation.   2. Hypertension: borderline today -continue salt avoidance -continue HCTZ 12.5 mg, losartan 100 mg -on metoprolol succinate 50 mg. If BP remains elevated, consider changing to carvedilol -given chronic LE edema, adding amlodipine would not be first choice, would need to monitor swelling -continue activity, weight loss  3. Cough: unclear etiology. Not on ACEi. Euvolemic. No history of allergies. Reports recent PFTs as normal. Started on albuterol inhaler. [facility]  4. Cardiac risk factors: -tobacco: The patient was counseled on the dangers of tobacco use, and was advised to quit.  Reviewed strategies to maximize success, including removing cigarettes and smoking materials from environment, stress management, substitution of other forms of reinforcement and pharmacotherapy (nicotine replacement). 5 minutes spent on tobacco cessation counseling. -hyperlipidemia: on lovastatin and lovaza. LDL 92,  TG 218 Weight loss and exercise may be able to help manage this -obesity: counseled on diet and activity, he is motivated and actively losing weight. Class 3 obesity, BMI 42. -diabetes: A1c 6.8. Working on diet, weight loss, activity. On metformin.  Plan for follow up: 1 year or sooner PRN  TIME SPENT WITH PATIENT: 25 minutes of direct patient care. More than 50% of that time was spent on coordination of care and counseling regarding medication counseling, lifestyle counseling.  Jodelle Red, MD, PhD [place]  [facility]   Medication Adjustments/Labs and Tests Ordered: Current medicines are reviewed at length with the patient today.  Concerns regarding medicines are outlined above.  No orders of the defined types were placed in this encounter.  No orders of the defined types were placed in this encounter.   Patient Instructions  Medication Instructions:  Your Physician recommend you continue on your current medication as directed.    If you need a refill on your cardiac medications before your next appointment, please call your pharmacy.   Lab work: None  Testing/Procedures: None  Follow-Up: At [facility], you and your health needs are our priority.  As part of our continuing mission to provide you with exceptional heart care, we have created designated Provider Care Teams.  These Care Teams include your primary Cardiologist (physician) and Advanced Practice Providers (APPs -  Physician Assistants and Nurse  Practitioners) who all work together to provide you with the care you need, when you need it. You will need a follow up appointment in 1 years.  Please call our office 2 months in advance to schedule this appointment.  You may see Jodelle Red, MD or one of the following Advanced Practice Providers on your designated Care Team:   Theodore Demark, PA-C . Joni Reining, DNP, ANP       Signed, Jodelle Red, MD PhD 10/12/2018 1:58 PM    [facility]

## 2018-10-12 NOTE — Patient Instructions (Signed)

## 2018-10-16 ENCOUNTER — Ambulatory Visit: Payer: Medicaid Other | Admitting: Registered"

## 2018-10-22 MED FILL — !VIAGRA 100MG TABLET: 100 | 30 days supply | Qty: 10 | Fill #1

## 2018-10-22 MED FILL — LOVASTATIN 20 MG TABLET: 20 | 30 days supply | Qty: 30 | Fill #6

## 2018-10-22 MED FILL — LOSARTAN POTASSIUM 100 MG T: 100 | 30 days supply | Qty: 30 | Fill #6

## 2018-10-23 ENCOUNTER — Encounter: Payer: Medicaid Other | Attending: Physician Assistant | Admitting: Registered"

## 2018-10-23 ENCOUNTER — Encounter: Payer: Self-pay | Admitting: Registered"

## 2018-10-23 DIAGNOSIS — Z6841 Body Mass Index (BMI) 40.0 and over, adult: Secondary | ICD-10-CM

## 2018-10-23 DIAGNOSIS — Z713 Dietary counseling and surveillance: Secondary | ICD-10-CM | POA: Insufficient documentation

## 2018-10-23 NOTE — Progress Notes (Signed)
  Medical Nutrition Therapy:  Appt start time: 11:35 end time: 12:00   Assessment:  Primary concerns today: Pt states he has a hernia and wants to lose weight.    Pt expectations: knowledge of learning to lose weight, what he should eat and shouldn't eat, and help with healthy attitude  Pt states he has been eating less meat, more vegetables, and a lot less starches. Pt reports smoking 0-3 cigarettes a day. Pt states he was borderline for diabetes; recent A1c labs are 6.9 and 6.8. Pt states next PCP visit is in 2 days, will have labs drawn again.    Next visit- discuss recent A1c values from PCP visit, diabetes education, etc   Preferred Learning Style:   No preference indicated   Learning Readiness:   Contemplating  Ready  Change in progress   MEDICATIONS: See list   DIETARY INTAKE:  Usual eating pattern includes 3 meals and 0 snacks per day.  Everyday foods include fruit, fruit juice, processed foods, alcohol, chicken, vegetables and ginger ale.  Avoided foods include red meat.    24-hr recall:  B ( AM): English muffin + eggs + cheese + strawberries  Snk ( AM): none  L ( PM): sausage + cheese + 1 slice of wheat bread  Snk ( PM): none D (by 6 PM): bbq steak + bbq chicken strip + broccoli + brown rice + roll  Snk ( PM): none Beverages: juice, beer (2x/day), ginger ale (rarely), water  Usual physical activity: cardio (treadmill, biking, elliptical-30 min) + strength training (45 min), 3-4 days/week   Estimated energy needs: 2000 calories 225 g carbohydrates 150 g protein 56 g fat  Progress Towards Goal(s):  In progress.   Nutritional Diagnosis:  NB-3.2 Limited access to food or water As related to  lack of financial resources ti purchase sufficient quanitity or variety of healthful foods.  As evidenced by pt reports not having enough money for food at times.    Intervention:  Nutrition education and counseling. Pt was encouraged on his recent behavioral changes  related to balanced meals, increasing vegetable intake, and increasing physical activity. Pt was in agreement with goals listed.  Goals: - Continue to increase physical activity with goal of at least 5 days/week.  - Can snack on fruit + nuts or fruit + cheese after dinner if needed.  - You're doing great! Keep up the great work!  Teaching Method Utilized:  Visual Auditory Hands on  Handouts given during visit include:  none  Barriers to learning/adherence to lifestyle change: limited finances  Demonstrated degree of understanding via:  Teach Back   Monitoring/Evaluation:  Dietary intake, exercise, and body weight in 1 month(s).

## 2018-10-23 NOTE — Patient Instructions (Addendum)
-   Continue to increase physical activity with goal of at least 5 days/week.   - Can snack on fruit + nuts or fruit + cheese after dinner if needed.   - You're doing great! Keep up the great work!

## 2018-10-25 ENCOUNTER — Other Ambulatory Visit: Payer: Self-pay

## 2018-10-25 ENCOUNTER — Encounter (INDEPENDENT_AMBULATORY_CARE_PROVIDER_SITE_OTHER): Payer: Self-pay | Admitting: Primary Care

## 2018-10-25 ENCOUNTER — Ambulatory Visit (INDEPENDENT_AMBULATORY_CARE_PROVIDER_SITE_OTHER): Payer: Medicaid Other | Admitting: Primary Care

## 2018-10-25 VITALS — BP 105/61 | HR 70 | Temp 98.1°F | Ht 69.0 in | Wt 275.8 lb

## 2018-10-25 DIAGNOSIS — I10 Essential (primary) hypertension: Secondary | ICD-10-CM

## 2018-10-25 DIAGNOSIS — E782 Mixed hyperlipidemia: Secondary | ICD-10-CM | POA: Diagnosis not present

## 2018-10-25 DIAGNOSIS — K219 Gastro-esophageal reflux disease without esophagitis: Secondary | ICD-10-CM

## 2018-10-25 MED ORDER — OMEPRAZOLE 20 MG PO CPDR: 20.0000 mg | DELAYED_RELEASE_CAPSULE | Freq: Every day | ORAL | 3 refills | Status: DC

## 2018-10-25 MED FILL — OMEPRAZOLE 20 MG CAPSULE DR: 20 | 30 days supply | Qty: 30 | Fill #0

## 2018-10-25 NOTE — Progress Notes (Signed)
Established Patient Office Visit  Subjective:  Patient ID: Thomas Medina Aubry, male    DOB: [birth year]  Age: 63 y.o. MRN: [id number]  CC:  Chief Complaint  Patient presents with  . Follow-up    HTN    HPI Thomas Medina Rohlfs presents for follow up on Bp. He has made a lot of progress with dietary modification, exercise and medication compliance. He continues to exercise to reduce weightwith diet modification. He c/o a cough at night questioned about propping up on pillows and not to eat late and lie down. Discussed GERD with him and cigarette cessation.  Past Medical History:  Diagnosis Date  . Depression   . Diabetes mellitus without complication (HCC)   . Hypertension   . PTSD (post-traumatic stress disorder)   . Sleep apnea   . Vision abnormalities     Past Surgical History:  Procedure Laterality Date  . POLYPECTOMY      Family History  Problem Relation Age of Onset  . Aneurysm Mother   . Heart attack Father   . Hypertension Father   . Hyperlipidemia Father   . Renal cancer Brother   . Kidney disease Brother     Social History   Socioeconomic History  . Marital status: Divorced    Spouse name: Not on file  . Number of children: Not on file  . Years of education: Not on file  . Highest education level: Not on file  Occupational History  . Not on file  Social Needs  . Financial resource strain: Not on file  . Food insecurity:    Worry: Sometimes true    Inability: Sometimes true  . Transportation needs:    Medical: Not on file    Non-medical: Not on file  Tobacco Use  . Smoking status: Current Every Day Smoker    Packs/day: 0.25    Types: Cigarettes  . Smokeless tobacco: Never Used  Substance and Sexual Activity  . Alcohol use: Yes    Comment: occ  . Drug use: No  . Sexual activity: Not on file  Lifestyle  . Physical activity:    Days per week: Not on file    Minutes per session: Not on file  . Stress: Not on file  Relationships  . Social  connections:    Talks on phone: Not on file    Gets together: Not on file    Attends religious service: Not on file    Active member of club or organization: Not on file    Attends meetings of clubs or organizations: Not on file    Relationship status: Not on file  . Intimate partner violence:    Fear of current or ex partner: Not on file    Emotionally abused: Not on file    Physically abused: Not on file    Forced sexual activity: Not on file  Other Topics Concern  . Not on file  Social History Narrative  . Not on file    Outpatient Medications Prior to Visit  Medication Sig Dispense Refill  . BELBUCA 450 MCG FILM TK 1 FILM INSIDE CHEEKS Q 12 H  0  . DULoxetine (CYMBALTA) 20 MG capsule Take 2 capsules (40 mg total) by mouth daily. 60 capsule 11  . hydrochlorothiazide (HYDRODIURIL) 12.5 MG tablet Take 1 tablet (12.5 mg total) by mouth daily. 90 tablet 3  . hydrOXYzine (ATARAX/VISTARIL) 50 MG tablet Take 50 mg by mouth at bedtime.    Marland Kitchen. ibuprofen (ADVIL,MOTRIN) 600 MG tablet Take  1 tablet (600 mg total) by mouth every 8 (eight) hours as needed. 30 tablet 1  . losartan (COZAAR) 100 MG tablet Take 1 tablet (100 mg total) by mouth daily. 30 tablet 11  . lovastatin (MEVACOR) 20 MG tablet Take 1 tablet (20 mg total) by mouth at bedtime. 30 tablet 11  . metFORMIN (GLUCOPHAGE-XR) 750 MG 24 hr tablet Take 750 mg by mouth daily with breakfast.    . metoprolol succinate (TOPROL-XL) 50 MG 24 hr tablet TAKE 1 TABLET BY MOUTH DAILY. TAKE WITH OR IMMEDIATELY FOLLOWING A MEAL. 30 tablet 11  . omega-3 acid ethyl esters (LOVAZA) 1 g capsule Take 2 capsules (2 g total) by mouth 2 (two) times daily. 120 capsule 5  . oxyCODONE (OXY IR/ROXICODONE) 5 MG immediate release tablet TK 1 T PO TID PRN  0  . phentermine (ADIPEX-P) 37.5 MG tablet TK 1 T PO QD    . sildenafil (VIAGRA) 100 MG tablet Take 1 tablet (100 mg total) by mouth daily as needed for erectile dysfunction. 10 tablet 11  . SYMBICORT 160-4.5  MCG/ACT inhaler INHALE TWO PUFFS INTO THE LUNGS TWICE DAILY    . topiramate (TOPAMAX) 50 MG tablet Take 50 mg by mouth every evening.     No facility-administered medications prior to visit.     No Known Allergies  ROS Review of Systems  Constitutional: Negative.   HENT: Negative.   Eyes: Negative.   Respiratory: Positive for cough.   Gastrointestinal: Negative.   Endocrine: Negative.   Musculoskeletal: Negative.   Skin: Negative.   Allergic/Immunologic: Negative.   Neurological: Negative.   Hematological: Negative.   Psychiatric/Behavioral: Negative.       Objective:    Physical Exam  Constitutional: He is oriented to person, place, and time. He appears well-nourished.  HENT:  Head: Normocephalic.  Eyes: Pupils are equal, round, and reactive to light. EOM are normal.  Neck: Normal range of motion. Neck supple.  Cardiovascular: Normal rate and regular rhythm.  Pulmonary/Chest: Effort normal and breath sounds normal.  Abdominal: Soft. Bowel sounds are normal. He exhibits distension.  Musculoskeletal: Normal range of motion.  Neurological: He is oriented to person, place, and time.  Skin: Skin is warm and dry.  Psychiatric: He has a normal mood and affect.    BP 105/61 (BP Location: Left Arm, Patient Position: Sitting, Cuff Size: Large)   Pulse 70   Temp 98.1 F (36.7 C) (Oral)   Ht 5\' 9"  (1.753 m)   Wt 275 lb 12.8 oz (125.1 kg)   SpO2 96%   BMI 40.73 kg/m  Wt Readings from Last 3 Encounters:  10/25/18 275 lb 12.8 oz (125.1 kg)  10/12/18 288 lb (130.6 kg)  10/03/18 287 lb 12.8 oz (130.5 kg)     Health Maintenance Due  Topic Date Due  . PNEUMOCOCCAL POLYSACCHARIDE VACCINE AGE 87-64 HIGH RISK  09/08/1959  . FOOT EXAM  09/08/1967    There are no preventive care reminders to display for this patient.  Lab Results  Component Value Date   TSH 2.540 04/18/2017   Lab Results  Component Value Date   WBC 9.2 09/20/2017   HGB 14.6 03/22/2018   HCT 43.0  03/22/2018   MCV 89 09/20/2017   PLT 342 09/20/2017   Lab Results  Component Value Date   NA 140 04/03/2018   K 4.9 04/03/2018   CO2 25 04/03/2018   GLUCOSE 136 (H) 04/03/2018   BUN 11 04/03/2018   CREATININE 0.85 04/03/2018  BILITOT 0.3 04/03/2018   ALKPHOS 97 04/03/2018   AST 31 04/03/2018   ALT 68 (H) 04/03/2018   PROT 7.0 04/03/2018   ALBUMIN 4.3 04/03/2018   CALCIUM 9.2 04/03/2018   Lab Results  Component Value Date   CHOL 168 04/03/2018   Lab Results  Component Value Date   HDL 32 (L) 04/03/2018   Lab Results  Component Value Date   LDLCALC 92 04/03/2018   Lab Results  Component Value Date   TRIG 218 (H) 04/03/2018   Lab Results  Component Value Date   CHOLHDL 5.3 (H) 04/03/2018   Lab Results  Component Value Date   HGBA1C 6.8 (A) 07/05/2018      Assessment & Plan:   Problem List Items Addressed This Visit    Essential hypertension Today visit Bp excellent no changes on Bp meds     Mixed hyperlipidemiapt  is on a statin cont on f/u we will reck labs        Morbid obesity (HCC)      Discussed exercise as tolerated and healthy eating and snacking     Meds ordered this encounter  Medications  . omeprazole (PRILOSEC) 20 MG capsule    Sig: Take 1 capsule (20 mg total) by mouth daily.    Dispense:  30 capsule    Refill:  3    Follow-up: No follow-ups on file.    Grayce SessionsMichelle P Ceola Para, NP

## 2018-11-19 ENCOUNTER — Other Ambulatory Visit: Payer: Self-pay

## 2018-11-19 ENCOUNTER — Ambulatory Visit (HOSPITAL_COMMUNITY)
Admission: EM | Admit: 2018-11-19 | Discharge: 2018-11-19 | Disposition: A | Discharge status: Home / Self Care | Payer: Medicaid Other | Attending: Family Medicine | Admitting: Family Medicine

## 2018-11-19 ENCOUNTER — Encounter (HOSPITAL_COMMUNITY): Payer: Self-pay

## 2018-11-19 DIAGNOSIS — R109 Unspecified abdominal pain: Secondary | ICD-10-CM

## 2018-11-19 DIAGNOSIS — K5900 Constipation, unspecified: Secondary | ICD-10-CM | POA: Diagnosis not present

## 2018-11-19 MED ORDER — POLYETHYLENE GLYCOL 3350 17 G PO PACK: 17.0000 g | PACK | Freq: Every day | ORAL | 0 refills | Status: DC

## 2018-11-19 MED ORDER — DOCUSATE SODIUM 283 MG RE ENEM: 1.0000 | ENEMA | Freq: Every day | RECTAL | 0 refills | Status: DC

## 2018-11-19 NOTE — ED Triage Notes (Signed)
Pt cc right side pain x 2 days. Pt thinks he maybe constipated x 4 days.

## 2018-11-19 NOTE — ED Provider Notes (Signed)
[facility]   [id number] 11/19/18 Arrival Time: 1624  ASSESSMENT & PLAN:  1. Constipation, unspecified constipation type   2. Abdominal discomfort    Benign abdominal exam. No indication for urgent abdominal imaging at this time. Do not suspect SBO. Discussed.  Trial of: Meds ordered this encounter  Medications  . polyethylene glycol (MIRALAX / GLYCOLAX) packet    Sig: Take 17 g by mouth daily.    Dispense:  14 each    Refill:  0  . docusate sodium (ENEMEEZ) 283 MG enema    Sig: Place 1 enema (283 mg total) rectally daily.    Dispense:  1 each    Refill:  0     Discharge Instructions     You have been seen today for abdominal pain. Your evaluation was not suggestive of any emergent condition requiring medical intervention at this time. However, some abdominal problems make take more time to appear. Therefore, it's very important for you to pay attention to any new symptoms or worsening of your current condition.  Please return here or to the Emergency Department immediately should you feel worse in any way or have any of the following symptoms: increasing or different abdominal pain, persistent vomiting, fevers, or shaking chills.   Follow-up Information    Schedule an appointment as soon as possible for a visit  with Grayce Sessions, NP.   Specialty:  Internal Medicine Contact information: [address] [place] [address] [phone]           The diagnosis was discussed with the patient and evaluation and treatment plans outlined. Adhere to simple, bland diet.  See AVS for d/c precautions.  Reviewed expectations re: course of current medical issues. Questions answered. Outlined signs and symptoms indicating need for more acute intervention. Patient verbalized understanding. After Visit Summary given.   SUBJECTIVE: History from: patient. Thomas Medina is a 63 y.o. male who presents with complaint of intermittent lower abdominal  discomfort(R>L). Onset gradual, over the past 2-3 days. Discomfort described as "a full feeling; achy"; without radiation. Symptoms are stable since beginning. Fever: absent. Aggravating factors: have not been identified. Alleviating factors: feels a little better after a couple of very small bowel movements today; without blood. Associated symptoms: none. He denies anorexia, arthralgias, belching, diarrhea, dysuria, fever, headache, hematuria, myalgias, nausea and vomiting. Appetite: normal. PO intake: normal. Ambulatory without assistance. Urinary symptoms: none; urinating normally. Bowel movements: "feel constipated". No flank or back pain. History of similar: yes; constipation in the past requiring an enema; many years ago. OTC treatment: none.  No LMP for male patient. Past Surgical History:  Procedure Laterality Date  . POLYPECTOMY      ROS: As per HPI. All other systems negative.  OBJECTIVE:  Vitals:   11/19/18 1722 11/19/18 1724  BP: (!) 147/63   Pulse: 86   Resp: 18   Temp: 97.8 F (36.6 C)   SpO2: 100%   Weight:  123.4 kg    General appearance: alert, oriented, no acute distress Lungs: clear to auscultation bilaterally; unlabored respirations Heart: regular rate and rhythm Abdomen: soft; without distention; mild tenderness over lower abdomen (R>L); bowel sounds present but quiet; without masses or organomegaly; without guarding or rebound tenderness Back: without CVA tenderness; FROM at waist Extremities: without LE edema; symmetrical; without gross deformities Skin: warm and dry Neurologic: normal gait Psychological: alert and cooperative; normal mood and affect   No Known Allergies  Past Medical History:  Diagnosis Date  . Depression   . Diabetes mellitus without complication (HCC)   . Hypertension   . PTSD (post-traumatic stress disorder)   . Sleep apnea   . Vision abnormalities    Social History   Socioeconomic  History  . Marital status: Divorced    Spouse name: Not on file  . Number of children: Not on file  . Years of education: Not on file  . Highest education level: Not on file  Occupational History  . Not on file  Social Needs  . Financial resource strain: Not on file  . Food insecurity:    Worry: Sometimes true    Inability: Sometimes true  . Transportation needs:    Medical: Not on file    Non-medical: Not on file  Tobacco Use  . Smoking status: Current Every Day Smoker    Packs/day: 0.25    Types: Cigarettes  . Smokeless tobacco: Never Used  Substance and Sexual Activity  . Alcohol use: Yes    Comment: occ  . Drug use: No  . Sexual activity: Not on file  Lifestyle  . Physical activity:    Days per week: Not on file    Minutes per session: Not on file  . Stress: Not on file  Relationships  . Social connections:    Talks on phone: Not on file    Gets together: Not on file    Attends religious service: Not on file    Active member of club or organization: Not on file    Attends meetings of clubs or organizations: Not on file    Relationship status: Not on file  . Intimate partner violence:    Fear of current or ex partner: Not on file    Emotionally abused: Not on file    Physically abused: Not on file    Forced sexual activity: Not on file  Other Topics Concern  . Not on file  Social History Narrative  . Not on file   Family History  Problem Relation Age of Onset  . Aneurysm Mother   . Heart attack Father   . Hypertension Father   . Hyperlipidemia Father   . Renal cancer Brother   . Kidney disease Brother      Mardella Layman, MD 11/19/18 [phone]

## 2018-11-19 NOTE — Discharge Instructions (Addendum)
You have been seen today for abdominal pain. Your evaluation was not suggestive of any emergent condition requiring medical intervention at this time. However, some abdominal problems make take more time to appear. Therefore, it's very important for you to pay attention to any new symptoms or worsening of your current condition. ° °Please return here or to the Emergency Department immediately should you feel worse in any way or have any of the following symptoms: increasing or different abdominal pain, persistent vomiting, fevers, or shaking chills. °

## 2018-11-22 ENCOUNTER — Ambulatory Visit: Payer: Medicaid Other | Admitting: Registered"

## 2018-11-23 MED FILL — LOSARTAN POTASSIUM 100 MG T: 100 | 30 days supply | Qty: 30 | Fill #7

## 2018-11-23 MED FILL — LOVASTATIN 20 MG TABLET: 20 | 30 days supply | Qty: 30 | Fill #7

## 2018-11-23 MED FILL — !VIAGRA 100MG TABLET: 100 | 30 days supply | Qty: 10 | Fill #2

## 2018-12-11 ENCOUNTER — Ambulatory Visit: Payer: Medicaid Other | Admitting: Registered"

## 2018-12-19 ENCOUNTER — Ambulatory Visit: Payer: Medicaid Other | Admitting: Registered"

## 2018-12-24 ENCOUNTER — Telehealth: Payer: Self-pay | Admitting: Primary Care

## 2018-12-24 MED FILL — LOSARTAN POTASSIUM 100 MG T: 100 | 30 days supply | Qty: 30 | Fill #8

## 2018-12-24 MED FILL — LOVASTATIN 20 MG TABS: 20 | 30 days supply | Qty: 30 | Fill #8

## 2018-12-24 MED FILL — !VIAGRA 100MG TABLET: 100 | 30 days supply | Qty: 10 | Fill #3

## 2018-12-24 NOTE — Telephone Encounter (Signed)
Pt had refills with the pharmacy, refills were submitted. The pharmacy will be in touch to arrange delivery of meds.

## 2018-12-24 NOTE — Telephone Encounter (Signed)
New Message  1) Medication(s) Requested (by name): Losartan, Lovastatin and Viagra   2) Pharmacy of Choice: [facility]  3) Special Requests: Pt states he is about out and has been calling since last thursday   Approved medications will be sent to the pharmacy, we will reach out if there is an issue.  Requests made after 3pm may not be addressed until the following business day!  If a patient is unsure of the name of the medication(s) please note and ask patient to call back when they are able to provide all info, do not send to responsible party until all information is available!

## 2019-01-14 ENCOUNTER — Telehealth: Payer: Self-pay | Admitting: Primary Care

## 2019-01-14 MED FILL — METOPROLOL SUCCINATE ER 50: 50 | 30 days supply | Qty: 30 | Fill #1

## 2019-01-14 NOTE — Telephone Encounter (Signed)
New Message   1) Medication(s) Requested (by name): Metoprolol, Losartan and Viagra   2) Pharmacy of Choice: [facility]  3) Special Requests:   Approved medications will be sent to the pharmacy, we will reach out if there is an issue.  Requests made after 3pm may not be addressed until the following business day!  If a patient is unsure of the name of the medication(s) please note and ask patient to call back when they are able to provide all info, do not send to responsible party until all information is available!

## 2019-01-14 NOTE — Telephone Encounter (Signed)
Pt too soon to refill losartan and viagra, metoptolol was refilled for patient. He should contact the pharmacy for any other questions.

## 2019-01-29 MED FILL — !VIAGRA 100MG TABLET: 100 | 30 days supply | Qty: 10 | Fill #4

## 2019-01-29 MED FILL — LOSARTAN POTASSIUM 100 MG T: 100 | 90 days supply | Qty: 90 | Fill #9

## 2019-01-29 MED FILL — LOVASTATIN 20 MG TABS: 20 | 30 days supply | Qty: 30 | Fill #9

## 2019-02-26 MED FILL — METOPROLOL SUCCINATE ER 50: 50 | 30 days supply | Qty: 30 | Fill #2

## 2019-02-26 MED FILL — !VIAGRA 100MG TABLET: 100 | 30 days supply | Qty: 10 | Fill #5

## 2019-02-26 MED FILL — LOVASTATIN 20 MG TABS: 20 | 30 days supply | Qty: 30 | Fill #10

## 2019-04-03 ENCOUNTER — Telehealth (INDEPENDENT_AMBULATORY_CARE_PROVIDER_SITE_OTHER): Payer: Self-pay

## 2019-04-03 NOTE — Telephone Encounter (Signed)
Patient called to request refill of metoprolol and viagra sent to [facility]. Nat Christen, CMA

## 2019-04-05 LAB — TSH: TSH: 0.01 — AB (ref 0.41–5.90)

## 2019-04-10 ENCOUNTER — Telehealth (INDEPENDENT_AMBULATORY_CARE_PROVIDER_SITE_OTHER): Payer: Self-pay

## 2019-04-10 NOTE — Telephone Encounter (Signed)
Patient called to know the status of his medication refill. Patient called on 04-03-19 to request refill.   Please advice [phone]  Thank you Whitney Post

## 2019-04-10 NOTE — Telephone Encounter (Signed)
FWD to PCP. Kaleena Corrow [place], CMA  

## 2019-04-12 ENCOUNTER — Other Ambulatory Visit (INDEPENDENT_AMBULATORY_CARE_PROVIDER_SITE_OTHER): Payer: Self-pay | Admitting: Primary Care

## 2019-04-12 DIAGNOSIS — I10 Essential (primary) hypertension: Secondary | ICD-10-CM

## 2019-04-12 MED ORDER — METOPROLOL SUCCINATE ER 50 MG PO TB24: ORAL_TABLET | ORAL | 11 refills | Status: DC

## 2019-04-12 MED FILL — METOPROLOL SUCCINATE ER 50: 50 | 30 days supply | Qty: 30 | Fill #0

## 2019-04-12 NOTE — Telephone Encounter (Signed)
Metoprolol sent discontinued Viagra that prescription was for 11 months he needs an in person blood pressure check

## 2019-04-15 NOTE — Telephone Encounter (Signed)
Patient has an appointment schedule for 04-23-19 @1 :30 pm.  Thank you Whitney Post

## 2019-04-23 ENCOUNTER — Encounter (INDEPENDENT_AMBULATORY_CARE_PROVIDER_SITE_OTHER): Payer: Self-pay | Admitting: Primary Care

## 2019-04-23 ENCOUNTER — Ambulatory Visit (INDEPENDENT_AMBULATORY_CARE_PROVIDER_SITE_OTHER): Payer: Medicaid Other | Admitting: Primary Care

## 2019-04-23 ENCOUNTER — Other Ambulatory Visit: Payer: Self-pay

## 2019-04-23 VITALS — BP 171/74 | HR 111 | Temp 97.3°F | Wt 271.4 lb

## 2019-04-23 DIAGNOSIS — E119 Type 2 diabetes mellitus without complications: Secondary | ICD-10-CM | POA: Diagnosis not present

## 2019-04-23 DIAGNOSIS — Z760 Encounter for issue of repeat prescription: Secondary | ICD-10-CM

## 2019-04-23 DIAGNOSIS — I10 Essential (primary) hypertension: Secondary | ICD-10-CM

## 2019-04-23 DIAGNOSIS — Z6841 Body Mass Index (BMI) 40.0 and over, adult: Secondary | ICD-10-CM

## 2019-04-23 DIAGNOSIS — R7989 Other specified abnormal findings of blood chemistry: Secondary | ICD-10-CM

## 2019-04-23 DIAGNOSIS — N529 Male erectile dysfunction, unspecified: Secondary | ICD-10-CM

## 2019-04-23 DIAGNOSIS — E785 Hyperlipidemia, unspecified: Secondary | ICD-10-CM

## 2019-04-23 LAB — POCT GLYCOSYLATED HEMOGLOBIN (HGB A1C): Hemoglobin A1C: 6.4 % — AB (ref 4.0–5.6)

## 2019-04-23 LAB — GLUCOSE, POCT (MANUAL RESULT ENTRY): POC Glucose: 152 mg/dl — AB (ref 70–99)

## 2019-04-23 MED ORDER — LOSARTAN POTASSIUM 100 MG PO TABS: 100.0000 mg | ORAL_TABLET | Freq: Every day | ORAL | 3 refills | Status: DC

## 2019-04-23 MED ORDER — LOVASTATIN 20 MG PO TABS: 20.0000 mg | ORAL_TABLET | Freq: Every day | ORAL | 3 refills | Status: DC

## 2019-04-23 MED ORDER — METFORMIN HCL ER 750 MG PO TB24: 750.0000 mg | ORAL_TABLET | Freq: Every day | ORAL | 3 refills | Status: DC

## 2019-04-23 MED ORDER — HYDROXYZINE HCL 50 MG PO TABS: 50.0000 mg | ORAL_TABLET | Freq: Every day | ORAL | 3 refills | Status: DC

## 2019-04-23 MED ORDER — DULOXETINE HCL 20 MG PO CPEP: 40.0000 mg | ORAL_CAPSULE | Freq: Every day | ORAL | 3 refills | Status: DC

## 2019-04-23 MED ORDER — HYDROCHLOROTHIAZIDE 25 MG PO TABS: 25.0000 mg | ORAL_TABLET | Freq: Every day | ORAL | 3 refills | Status: DC

## 2019-04-23 MED ORDER — METOPROLOL SUCCINATE ER 50 MG PO TB24: ORAL_TABLET | ORAL | 3 refills | Status: DC

## 2019-04-23 MED ORDER — TOPIRAMATE 50 MG PO TABS: 50.0000 mg | ORAL_TABLET | Freq: Every evening | ORAL | 2 refills | Status: DC

## 2019-04-23 MED FILL — LOVASTATIN 20 MG TABS: 20 | 90 days supply | Qty: 90 | Fill #0

## 2019-04-23 MED FILL — HYDROCHLOROTHIAZIDE 25 MG T: 25 | 90 days supply | Qty: 90 | Fill #0

## 2019-04-23 MED FILL — TOPIRAMATE 50 MG TABLET: 50 | 30 days supply | Qty: 30 | Fill #0

## 2019-04-23 MED FILL — hydrOXYzine HCL 50 MG TABS: 50 | 90 days supply | Qty: 90 | Fill #0

## 2019-04-23 NOTE — Patient Instructions (Signed)

## 2019-04-23 NOTE — Progress Notes (Addendum)
Established Patient Office Visit  Subjective:  Patient ID: Thomas Medina, male    DOB: November 23, 1955  Age: 63 y.o. MRN: [id number]  CC:  Chief Complaint  Patient presents with  . Follow-up    BP check    HPI [name] presents for blood pressure rechecked and refills request. Especially Viagra last visit not as long as his blood pressure is uncontrolled. Bp remains elevated. He denies shortness of breath, headaches, chest pain or lower extremity edema  Past Medical History:  Diagnosis Date  . Depression   . Diabetes mellitus without complication ([place])   . Hypertension   . PTSD (post-traumatic stress disorder)   . Sleep apnea   . Vision abnormalities     Past Surgical History:  Procedure Laterality Date  . POLYPECTOMY      Family History  Problem Relation Age of Onset  . Aneurysm Mother   . Heart attack Father   . Hypertension Father   . Hyperlipidemia Father   . Renal cancer Brother   . Kidney disease Brother     Social History   Socioeconomic History  . Marital status: Divorced    Spouse name: Not on file  . Number of children: Not on file  . Years of education: Not on file  . Highest education level: Not on file  Occupational History  . Not on file  Social Needs  . Financial resource strain: Not on file  . Food insecurity    Worry: Sometimes true    Inability: Sometimes true  . Transportation needs    Medical: Not on file    Non-medical: Not on file  Tobacco Use  . Smoking status: Current Every Day Smoker    Packs/day: 0.25    Types: Cigarettes  . Smokeless tobacco: Never Used  Substance and Sexual Activity  . Alcohol use: Yes    Comment: occ  . Drug use: No  . Sexual activity: Not on file  Lifestyle  . Physical activity    Days per week: Not on file    Minutes per session: Not on file  . Stress: Not on file  Relationships  . Social Herbalist on phone: Not on file    Gets together: Not on file    Attends religious  service: Not on file    Active member of club or organization: Not on file    Attends meetings of clubs or organizations: Not on file    Relationship status: Not on file  . Intimate partner violence    Fear of current or ex partner: Not on file    Emotionally abused: Not on file    Physically abused: Not on file    Forced sexual activity: Not on file  Other Topics Concern  . Not on file  Social History Narrative  . Not on file    Outpatient Medications Prior to Visit  Medication Sig Dispense Refill  . BELBUCA 450 MCG FILM TK 1 FILM INSIDE CHEEKS Q 12 H  0  . ibuprofen (ADVIL,MOTRIN) 600 MG tablet Take 1 tablet (600 mg total) by mouth every 8 (eight) hours as needed. 30 tablet 1  . oxyCODONE (OXY IR/ROXICODONE) 5 MG immediate release tablet TK 1 T PO TID PRN  0  . phentermine (ADIPEX-P) 37.5 MG tablet TK 1 T PO QD    . SYMBICORT 160-4.5 MCG/ACT inhaler INHALE TWO PUFFS INTO THE LUNGS TWICE DAILY    . DULoxetine (CYMBALTA) 20 MG capsule Take 2  capsules (40 mg total) by mouth daily. 60 capsule 11  . hydrochlorothiazide (HYDRODIURIL) 12.5 MG tablet Take 1 tablet (12.5 mg total) by mouth daily. 90 tablet 3  . hydrOXYzine (ATARAX/VISTARIL) 50 MG tablet Take 50 mg by mouth at bedtime.    Marland Kitchen. losartan (COZAAR) 100 MG tablet Take 1 tablet (100 mg total) by mouth daily. 30 tablet 11  . lovastatin (MEVACOR) 20 MG tablet Take 1 tablet (20 mg total) by mouth at bedtime. 30 tablet 11  . metFORMIN (GLUCOPHAGE-XR) 750 MG 24 hr tablet Take 750 mg by mouth daily with breakfast.    . metoprolol succinate (TOPROL-XL) 50 MG 24 hr tablet TAKE 1 TABLET BY MOUTH DAILY. TAKE WITH OR IMMEDIATELY FOLLOWING A MEAL. 30 tablet 11  . topiramate (TOPAMAX) 50 MG tablet Take 50 mg by mouth every evening.    . docusate sodium (ENEMEEZ) 283 MG enema Place 1 enema (283 mg total) rectally daily. (Patient not taking: Reported on 04/23/2019) 1 each 0  . omega-3 acid ethyl esters (LOVAZA) 1 g capsule Take 2 capsules (2 g total)  by mouth 2 (two) times daily. (Patient not taking: Reported on 04/23/2019) 120 capsule 5  . omeprazole (PRILOSEC) 20 MG capsule Take 1 capsule (20 mg total) by mouth daily. (Patient not taking: Reported on 04/23/2019) 30 capsule 3  . polyethylene glycol (MIRALAX / GLYCOLAX) packet Take 17 g by mouth daily. (Patient not taking: Reported on 04/23/2019) 14 each 0   No facility-administered medications prior to visit.     No Known Allergies  ROS Review of Systems  Respiratory: Positive for shortness of breath.        With excretion       Objective:    Physical Exam  Constitutional: He is oriented to person, place, and time. He appears well-developed and well-nourished.  HENT:  Head: Normocephalic.  Neck: Neck supple.  Cardiovascular: Normal rate and regular rhythm.  Pulmonary/Chest: Effort normal and breath sounds normal.  Abdominal: Soft. Bowel sounds are normal.  Musculoskeletal: Normal range of motion.  Neurological: He is oriented to person, place, and time.  Skin: Skin is warm and dry.  Psychiatric: He has a normal mood and affect.    BP (!) 171/74 (BP Location: Left Arm, Patient Position: Sitting, Cuff Size: Large)   Pulse (!) 111   Temp (!) 97.3 F (36.3 C) (Tympanic)   Wt 271 lb 6.4 oz (123.1 kg)   SpO2 97%   BMI 40.08 kg/m  Wt Readings from Last 3 Encounters:  04/23/19 271 lb 6.4 oz (123.1 kg)  11/19/18 272 lb (123.4 kg)  10/25/18 275 lb 12.8 oz (125.1 kg)     Health Maintenance Due  Topic Date Due  . PNEUMOCOCCAL POLYSACCHARIDE VACCINE AGE 30-64 HIGH RISK  09/08/1959  . FOOT EXAM  09/08/1967  . INFLUENZA VACCINE  04/13/2019    There are no preventive care reminders to display for this patient.  Lab Results  Component Value Date   TSH 2.540 04/18/2017   Lab Results  Component Value Date   WBC 9.2 09/20/2017   HGB 14.6 03/22/2018   HCT 43.0 03/22/2018   MCV 89 09/20/2017   PLT 342 09/20/2017   Lab Results  Component Value Date   NA 140 04/03/2018    K 4.9 04/03/2018   CO2 25 04/03/2018   GLUCOSE 136 (H) 04/03/2018   BUN 11 04/03/2018   CREATININE 0.85 04/03/2018   BILITOT 0.3 04/03/2018   ALKPHOS 97 04/03/2018   AST 31  04/03/2018   ALT 68 (H) 04/03/2018   PROT 7.0 04/03/2018   ALBUMIN 4.3 04/03/2018   CALCIUM 9.2 04/03/2018   Lab Results  Component Value Date   CHOL 168 04/03/2018   Lab Results  Component Value Date   HDL 32 (L) 04/03/2018   Lab Results  Component Value Date   LDLCALC 92 04/03/2018   Lab Results  Component Value Date   TRIG 218 (H) 04/03/2018   Lab Results  Component Value Date   CHOLHDL 5.3 (H) 04/03/2018   Lab Results  Component Value Date   HGBA1C 6.4 (A) 04/23/2019      Assessment & Plan:   [name] was seen today for follow-up.  Diagnoses and all orders for this visit:  Type 2 diabetes mellitus without complication, without long-term current use of insulin (HCC) ADA recommends the following therapeutic goals for glycemic control related to A1c measurements: Goal of therapy: Less than 6.5 hemoglobin A1c.  Reference clinical practice recommendations. Foods that are high in carbohydrates are the following rice, potatoes, breads, sugars, and pastas.  Reduction in the intake (eating) will assist in lowering your blood sugars. -     HgB A1c -     Glucose (CBG)  Morbid obesity (HCC) Morbid Obesity is 40 indicating an excess in caloric intake or underlining conditions. This may lead to other co-morbidities. Lifestyle modifications of diet and exercise may reduce obesity.   Low testosterone in male -     Testosterone  Erectile dysfunction, unspecified erectile dysfunction type  We discussed different medications for ED including Viagra, Levitra, and Cialis. We also discussed the risks of such medications, including priapism, monocular vision loss, and hypotension with nitrates. All questions were answered. The patient opted for:    Medication refill -     DULoxetine (CYMBALTA) 20 MG  capsule; Take 2 capsules (40 mg total) by mouth daily.  Hyperlipidemia, unspecified hyperlipidemia type  Foods high in cholesterol or liver, fatty meats,cheese, butter avocados, nuts and seeds, chocolate and fried foods. -     lovastatin (MEVACOR) 20 MG tablet; Take 1 tablet (20 mg total) by mouth at bedtime.  Hypertension, unspecified type Counseled on blood pressure goal of less than 130/80, low-sodium, DASH diet, medication compliance, 150 minutes of moderate intensity exercise per week. Discussed medication compliance, adverse effects. -     losartan (COZAAR) 100 MG tablet; Take 1 tablet (100 mg total) by mouth daily. -     metoprolol succinate (TOPROL-XL) 50 MG 24 hr tablet; TAKE 1 TABLET BY MOUTH DAILY. TAKE WITH OR IMMEDIATELY FOLLOWING A MEAL.  Other orders -     metFORMIN (GLUCOPHAGE-XR) 750 MG 24 hr tablet; Take 1 tablet (750 mg total) by mouth daily with breakfast. -     topiramate (TOPAMAX) 50 MG tablet; Take 1 tablet (50 mg total) by mouth every evening. -     hydrOXYzine (ATARAX/VISTARIL) 50 MG tablet; Take 1 tablet (50 mg total) by mouth at bedtime. -     hydrochlorothiazide (HYDRODIURIL) 25 MG tablet; Take 1 tablet (25 mg total) by mouth daily.    Meds ordered this encounter  Medications  . metFORMIN (GLUCOPHAGE-XR) 750 MG 24 hr tablet    Sig: Take 1 tablet (750 mg total) by mouth daily with breakfast.    Dispense:  30 tablet    Refill:  3  . topiramate (TOPAMAX) 50 MG tablet    Sig: Take 1 tablet (50 mg total) by mouth every evening.    Dispense:  10  tablet    Refill:  2  . hydrOXYzine (ATARAX/VISTARIL) 50 MG tablet    Sig: Take 1 tablet (50 mg total) by mouth at bedtime.    Dispense:  30 tablet    Refill:  3  . DULoxetine (CYMBALTA) 20 MG capsule    Sig: Take 2 capsules (40 mg total) by mouth daily.    Dispense:  60 capsule    Refill:  3  . lovastatin (MEVACOR) 20 MG tablet    Sig: Take 1 tablet (20 mg total) by mouth at bedtime.    Dispense:  30 tablet     Refill:  3  . losartan (COZAAR) 100 MG tablet    Sig: Take 1 tablet (100 mg total) by mouth daily.    Dispense:  30 tablet    Refill:  3  . metoprolol succinate (TOPROL-XL) 50 MG 24 hr tablet    Sig: TAKE 1 TABLET BY MOUTH DAILY. TAKE WITH OR IMMEDIATELY FOLLOWING A MEAL.    Dispense:  30 tablet    Refill:  3  . hydrochlorothiazide (HYDRODIURIL) 25 MG tablet    Sig: Take 1 tablet (25 mg total) by mouth daily.    Dispense:  30 tablet    Refill:  3    Follow-up: Return in about 3 weeks (around 05/14/2019) for Bp re check increased HCTZ.    Grayce SessionsMichelle P Raniah Karan, NP

## 2019-04-24 LAB — TESTOSTERONE: Testosterone: 265 ng/dL (ref 264–916)

## 2019-04-26 ENCOUNTER — Telehealth (INDEPENDENT_AMBULATORY_CARE_PROVIDER_SITE_OTHER): Payer: Self-pay

## 2019-04-26 NOTE — Telephone Encounter (Signed)
Patient is aware that all labs are normal. Thomas Medina, CMA  

## 2019-04-26 NOTE — Telephone Encounter (Signed)
-----   Message from Kerin Perna, NP sent at 04/26/2019 11:36 AM EDT ----- I have reviewed all labs and they are normal/unremarkable. Please notify patient of the results. Have them schedule appointment if there are any other concerns or questions. Thank you.

## 2019-05-14 ENCOUNTER — Encounter (INDEPENDENT_AMBULATORY_CARE_PROVIDER_SITE_OTHER): Payer: Self-pay | Admitting: Primary Care

## 2019-05-14 ENCOUNTER — Other Ambulatory Visit: Payer: Self-pay

## 2019-05-14 ENCOUNTER — Ambulatory Visit (INDEPENDENT_AMBULATORY_CARE_PROVIDER_SITE_OTHER): Payer: Medicaid Other | Admitting: Primary Care

## 2019-05-14 DIAGNOSIS — Z760 Encounter for issue of repeat prescription: Secondary | ICD-10-CM | POA: Diagnosis not present

## 2019-05-14 DIAGNOSIS — I10 Essential (primary) hypertension: Secondary | ICD-10-CM

## 2019-05-14 DIAGNOSIS — N529 Male erectile dysfunction, unspecified: Secondary | ICD-10-CM

## 2019-05-14 DIAGNOSIS — Z6839 Body mass index (BMI) 39.0-39.9, adult: Secondary | ICD-10-CM | POA: Diagnosis not present

## 2019-05-14 MED ORDER — LOSARTAN POTASSIUM 100 MG PO TABS: 100.0000 mg | ORAL_TABLET | Freq: Every day | ORAL | 3 refills | Status: AC

## 2019-05-14 MED ORDER — HYDROXYZINE HCL 50 MG PO TABS: 50.0000 mg | ORAL_TABLET | Freq: Every day | ORAL | 3 refills | Status: DC

## 2019-05-14 MED ORDER — HYDROCHLOROTHIAZIDE 25 MG PO TABS: 25.0000 mg | ORAL_TABLET | Freq: Every day | ORAL | 3 refills | Status: DC

## 2019-05-14 MED ORDER — IBUPROFEN 600 MG PO TABS: 600.0000 mg | ORAL_TABLET | Freq: Three times a day (TID) | ORAL | 1 refills | Status: DC | PRN

## 2019-05-14 MED ORDER — TOPIRAMATE 50 MG PO TABS: 50.0000 mg | ORAL_TABLET | Freq: Every evening | ORAL | 2 refills | Status: DC

## 2019-05-14 MED ORDER — DULOXETINE HCL 20 MG PO CPEP: 40.0000 mg | ORAL_CAPSULE | Freq: Every day | ORAL | 3 refills | Status: AC

## 2019-05-14 MED ORDER — METOPROLOL SUCCINATE ER 50 MG PO TB24: ORAL_TABLET | ORAL | 3 refills | Status: DC

## 2019-05-14 MED ORDER — SILDENAFIL CITRATE 100 MG PO TABS: 50.0000 mg | ORAL_TABLET | Freq: Every day | ORAL | 11 refills | Status: DC | PRN

## 2019-05-14 MED ORDER — METFORMIN HCL ER 750 MG PO TB24: 750.0000 mg | ORAL_TABLET | Freq: Every day | ORAL | 3 refills | Status: DC

## 2019-05-14 NOTE — Patient Instructions (Signed)
° °Calorie Counting for Weight Loss °Calories are units of energy. Your body needs a certain amount of calories from food to keep you going throughout the day. When you eat more calories than your body needs, your body stores the extra calories as fat. When you eat fewer calories than your body needs, your body burns fat to get the energy it needs. °Calorie counting means keeping track of how many calories you eat and drink each day. Calorie counting can be helpful if you need to lose weight. If you make sure to eat fewer calories than your body needs, you should lose weight. Ask your health care provider what a healthy weight is for you. °For calorie counting to work, you will need to eat the right number of calories in a day in order to lose a healthy amount of weight per week. A dietitian can help you determine how many calories you need in a day and will give you suggestions on how to reach your calorie goal. °· A healthy amount of weight to lose per week is usually 1-2 lb (0.5-0.9 kg). This usually means that your daily calorie intake should be reduced by 500-750 calories. °· Eating 1,200 - 1,500 calories per day can help most women lose weight. °· Eating 1,500 - 1,800 calories per day can help most men lose weight. °What is my plan? °My goal is to have __________ calories per day. °If I have this many calories per day, I should lose around __________ pounds per week. °What do I need to know about calorie counting? °In order to meet your daily calorie goal, you will need to: °· Find out how many calories are in each food you would like to eat. Try to do this before you eat. °· Decide how much of the food you plan to eat. °· Write down what you ate and how many calories it had. Doing this is called keeping a food log. °To successfully lose weight, it is important to balance calorie counting with a healthy lifestyle that includes regular activity. Aim for 150 minutes of moderate exercise (such as walking) or 75  minutes of vigorous exercise (such as running) each week. °Where do I find calorie information? ° °The number of calories in a food can be found on a Nutrition Facts label. If a food does not have a Nutrition Facts label, try to look up the calories online or ask your dietitian for help. °Remember that calories are listed per serving. If you choose to have more than one serving of a food, you will have to multiply the calories per serving by the amount of servings you plan to eat. For example, the label on a package of bread might say that a serving size is 1 slice and that there are 90 calories in a serving. If you eat 1 slice, you will have eaten 90 calories. If you eat 2 slices, you will have eaten 180 calories. °How do I keep a food log? °Immediately after each meal, record the following information in your food log: °· What you ate. Don't forget to include toppings, sauces, and other extras on the food. °· How much you ate. This can be measured in cups, ounces, or number of items. °· How many calories each food and drink had. °· The total number of calories in the meal. °Keep your food log near you, such as in a small notebook in your pocket, or use a mobile app or website. Some programs will   calculate calories for you and show you how many calories you have left for the day to meet your goal. °What are some calorie counting tips? ° °· Use your calories on foods and drinks that will fill you up and not leave you hungry: °? Some examples of foods that fill you up are nuts and nut butters, vegetables, lean proteins, and high-fiber foods like whole grains. High-fiber foods are foods with more than 5 g fiber per serving. °? Drinks such as sodas, specialty coffee drinks, alcohol, and juices have a lot of calories, yet do not fill you up. °· Eat nutritious foods and avoid empty calories. Empty calories are calories you get from foods or beverages that do not have many vitamins or protein, such as candy, sweets, and  soda. It is better to have a nutritious high-calorie food (such as an avocado) than a food with few nutrients (such as a bag of chips). °· Know how many calories are in the foods you eat most often. This will help you calculate calorie counts faster. °· Pay attention to calories in drinks. Low-calorie drinks include water and unsweetened drinks. °· Pay attention to nutrition labels for "low fat" or "fat free" foods. These foods sometimes have the same amount of calories or more calories than the full fat versions. They also often have added sugar, starch, or salt, to make up for flavor that was removed with the fat. °· Find a way of tracking calories that works for you. Get creative. Try different apps or programs if writing down calories does not work for you. °What are some portion control tips? °· Know how many calories are in a serving. This will help you know how many servings of a certain food you can have. °· Use a measuring cup to measure serving sizes. You could also try weighing out portions on a kitchen scale. With time, you will be able to estimate serving sizes for some foods. °· Take some time to put servings of different foods on your favorite plates, bowls, and cups so you know what a serving looks like. °· Try not to eat straight from a bag or box. Doing this can lead to overeating. Put the amount you would like to eat in a cup or on a plate to make sure you are eating the right portion. °· Use smaller plates, glasses, and bowls to prevent overeating. °· Try not to multitask (for example, watch TV or use your computer) while eating. If it is time to eat, sit down at a table and enjoy your food. This will help you to know when you are full. It will also help you to be aware of what you are eating and how much you are eating. °What are tips for following this plan? °Reading food labels °· Check the calorie count compared to the serving size. The serving size may be smaller than what you are used to  eating. °· Check the source of the calories. Make sure the food you are eating is high in vitamins and protein and low in saturated and trans fats. °Shopping °· Read nutrition labels while you shop. This will help you make healthy decisions before you decide to purchase your food. °· Make a grocery list and stick to it. °Cooking °· Try to cook your favorite foods in a healthier way. For example, try baking instead of frying. °· Use low-fat dairy products. °Meal planning °· Use more fruits and vegetables. Half of your plate should be   fruits and vegetables. °· Include lean proteins like poultry and fish. °How do I count calories when eating out? °· Ask for smaller portion sizes. °· Consider sharing an entree and sides instead of getting your own entree. °· If you get your own entree, eat only half. Ask for a box at the beginning of your meal and put the rest of your entree in it so you are not tempted to eat it. °· If calories are listed on the menu, choose the lower calorie options. °· Choose dishes that include vegetables, fruits, whole grains, low-fat dairy products, and lean protein. °· Choose items that are boiled, broiled, grilled, or steamed. Stay away from items that are buttered, battered, fried, or served with cream sauce. Items labeled "crispy" are usually fried, unless stated otherwise. °· Choose water, low-fat milk, unsweetened iced tea, or other drinks without added sugar. If you want an alcoholic beverage, choose a lower calorie option such as a glass of wine or light beer. °· Ask for dressings, sauces, and syrups on the side. These are usually high in calories, so you should limit the amount you eat. °· If you want a salad, choose a garden salad and ask for grilled meats. Avoid extra toppings like bacon, cheese, or fried items. Ask for the dressing on the side, or ask for olive oil and vinegar or lemon to use as dressing. °· Estimate how many servings of a food you are given. For example, a serving of  cooked rice is ½ cup or about the size of half a baseball. Knowing serving sizes will help you be aware of how much food you are eating at restaurants. The list below tells you how big or small some common portion sizes are based on everyday objects: °? 1 oz--4 stacked dice. °? 3 oz--1 deck of cards. °? 1 tsp--1 die. °? 1 Tbsp--½ a ping-pong ball. °? 2 Tbsp--1 ping-pong ball. °? ½ cup--½ baseball. °? 1 cup--1 baseball. °Summary °· Calorie counting means keeping track of how many calories you eat and drink each day. If you eat fewer calories than your body needs, you should lose weight. °· A healthy amount of weight to lose per week is usually 1-2 lb (0.5-0.9 kg). This usually means reducing your daily calorie intake by 500-750 calories. °· The number of calories in a food can be found on a Nutrition Facts label. If a food does not have a Nutrition Facts label, try to look up the calories online or ask your dietitian for help. °· Use your calories on foods and drinks that will fill you up, and not on foods and drinks that will leave you hungry. °· Use smaller plates, glasses, and bowls to prevent overeating. °This information is not intended to replace advice given to you by your health care provider. Make sure you discuss any questions you have with your health care provider. °Document Released: 08/29/2005 Document Revised: 05/18/2018 Document Reviewed: 07/29/2016 °Elsevier Patient Education © 2020 [employer] ° °

## 2019-05-14 NOTE — Progress Notes (Signed)
Established Patient Office Visit  Subjective:  Patient ID: [facility], male    DOB: Dec 06, 1955  Age: 63 y.o. MRN: [id number]  CC: No chief complaint on file.   HPI Thomas Companionnthony Offield presents for blood pressure re check last visit started on metoprolol 50mg  Xl Bp is unremarkable  He denies shortness of breath, headaches, chest pain or lower extremity edema. He has been exercising and has lost 5 lbs and change diet life style modification.  Past Medical History:  Diagnosis Date  . Depression   . Diabetes mellitus without complication (HCC)   . Hypertension   . PTSD (post-traumatic stress disorder)   . Sleep apnea   . Vision abnormalities     Past Surgical History:  Procedure Laterality Date  . POLYPECTOMY      Family History  Problem Relation Age of Onset  . Aneurysm Mother   . Heart attack Father   . Hypertension Father   . Hyperlipidemia Father   . Renal cancer Brother   . Kidney disease Brother     Social History   Socioeconomic History  . Marital status: Divorced    Spouse name: Not on file  . Number of children: Not on file  . Years of education: Not on file  . Highest education level: Not on file  Occupational History  . Not on file  Social Needs  . Financial resource strain: Not on file  . Food insecurity    Worry: Sometimes true    Inability: Sometimes true  . Transportation needs    Medical: Not on file    Non-medical: Not on file  Tobacco Use  . Smoking status: Current Every Day Smoker    Packs/day: 0.25    Types: Cigarettes  . Smokeless tobacco: Never Used  Substance and Sexual Activity  . Alcohol use: Yes    Comment: occ  . Drug use: No  . Sexual activity: Not on file  Lifestyle  . Physical activity    Days per week: Not on file    Minutes per session: Not on file  . Stress: Not on file  Relationships  . Social Musicianconnections    Talks on phone: Not on file    Gets together: Not on file    Attends religious service: Not on file   Active member of club or organization: Not on file    Attends meetings of clubs or organizations: Not on file    Relationship status: Not on file  . Intimate partner violence    Fear of current or ex partner: Not on file    Emotionally abused: Not on file    Physically abused: Not on file    Forced sexual activity: Not on file  Other Topics Concern  . Not on file  Social History Narrative  . Not on file    Outpatient Medications Prior to Visit  Medication Sig Dispense Refill  . BELBUCA 450 MCG FILM TK 1 FILM INSIDE CHEEKS Q 12 H  0  . docusate sodium (ENEMEEZ) 283 MG enema Place 1 enema (283 mg total) rectally daily. 1 each 0  . lovastatin (MEVACOR) 20 MG tablet Take 1 tablet (20 mg total) by mouth at bedtime. 30 tablet 3  . omega-3 acid ethyl esters (LOVAZA) 1 g capsule Take 2 capsules (2 g total) by mouth 2 (two) times daily. 120 capsule 5  . oxyCODONE (OXY IR/ROXICODONE) 5 MG immediate release tablet TK 1 T PO TID PRN  0  . phentermine (ADIPEX-P)  37.5 MG tablet TK 1 T PO QD    . polyethylene glycol (MIRALAX / GLYCOLAX) packet Take 17 g by mouth daily. 14 each 0  . SYMBICORT 160-4.5 MCG/ACT inhaler INHALE TWO PUFFS INTO THE LUNGS TWICE DAILY    . DULoxetine (CYMBALTA) 20 MG capsule Take 2 capsules (40 mg total) by mouth daily. 60 capsule 3  . hydrochlorothiazide (HYDRODIURIL) 25 MG tablet Take 1 tablet (25 mg total) by mouth daily. 30 tablet 3  . hydrOXYzine (ATARAX/VISTARIL) 50 MG tablet Take 1 tablet (50 mg total) by mouth at bedtime. 30 tablet 3  . ibuprofen (ADVIL,MOTRIN) 600 MG tablet Take 1 tablet (600 mg total) by mouth every 8 (eight) hours as needed. 30 tablet 1  . losartan (COZAAR) 100 MG tablet Take 1 tablet (100 mg total) by mouth daily. 30 tablet 3  . metFORMIN (GLUCOPHAGE-XR) 750 MG 24 hr tablet Take 1 tablet (750 mg total) by mouth daily with breakfast. 30 tablet 3  . metoprolol succinate (TOPROL-XL) 50 MG 24 hr tablet TAKE 1 TABLET BY MOUTH DAILY. TAKE WITH OR  IMMEDIATELY FOLLOWING A MEAL. 30 tablet 3  . topiramate (TOPAMAX) 50 MG tablet Take 1 tablet (50 mg total) by mouth every evening. 10 tablet 2  . omeprazole (PRILOSEC) 20 MG capsule Take 1 capsule (20 mg total) by mouth daily. (Patient not taking: Reported on 04/23/2019) 30 capsule 3   No facility-administered medications prior to visit.     No Known Allergies  ROS Review of Systems  All other systems reviewed and are negative.     Objective:    Physical Exam  Constitutional: He is oriented to person, place, and time. He appears well-developed and well-nourished.  HENT:  Head: Normocephalic.  Neck: Neck supple.  Cardiovascular: Normal rate and regular rhythm.  Pulmonary/Chest: Effort normal and breath sounds normal.  Abdominal: Soft. Bowel sounds are normal. He exhibits distension.  Musculoskeletal: Normal range of motion.  Neurological: He is alert and oriented to person, place, and time.  Skin: Skin is warm.  Psychiatric: He has a normal mood and affect.    BP 122/67 (BP Location: Left Arm, Patient Position: Sitting, Cuff Size: Large)   Pulse 85   Temp (!) 97.5 F (36.4 C) (Tympanic)   Ht 5\' 9"  (1.753 m)   Wt 264 lb 3.2 oz (119.8 kg)   SpO2 94%   BMI 39.02 kg/m  Wt Readings from Last 3 Encounters:  05/14/19 264 lb 3.2 oz (119.8 kg)  04/23/19 271 lb 6.4 oz (123.1 kg)  11/19/18 272 lb (123.4 kg)     Health Maintenance Due  Topic Date Due  . PNEUMOCOCCAL POLYSACCHARIDE VACCINE AGE 29-64 HIGH RISK  09/08/1959  . FOOT EXAM  09/08/1967    There are no preventive care reminders to display for this patient.  Lab Results  Component Value Date   TSH 2.540 04/18/2017   Lab Results  Component Value Date   WBC 9.2 09/20/2017   HGB 14.6 03/22/2018   HCT 43.0 03/22/2018   MCV 89 09/20/2017   PLT 342 09/20/2017   Lab Results  Component Value Date   NA 140 04/03/2018   K 4.9 04/03/2018   CO2 25 04/03/2018   GLUCOSE 136 (H) 04/03/2018   BUN 11 04/03/2018    CREATININE 0.85 04/03/2018   BILITOT 0.3 04/03/2018   ALKPHOS 97 04/03/2018   AST 31 04/03/2018   ALT 68 (H) 04/03/2018   PROT 7.0 04/03/2018   ALBUMIN 4.3 04/03/2018  CALCIUM 9.2 04/03/2018   Lab Results  Component Value Date   CHOL 168 04/03/2018   Lab Results  Component Value Date   HDL 32 (L) 04/03/2018   Lab Results  Component Value Date   LDLCALC 92 04/03/2018   Lab Results  Component Value Date   TRIG 218 (H) 04/03/2018   Lab Results  Component Value Date   CHOLHDL 5.3 (H) 04/03/2018   Lab Results  Component Value Date   HGBA1C 6.4 (A) 04/23/2019      Assessment & Plan:   Problem List Items Addressed This Visit    None    Visit Diagnoses    Morbid obesity ([place])    -  Primary   Relevant Medications   metFORMIN (GLUCOPHAGE-XR) 750 MG 24 hr tablet   Erectile dysfunction, unspecified erectile dysfunction type       Hypertension, unspecified type       Relevant Medications   metoprolol succinate (TOPROL-XL) 50 MG 24 hr tablet   sildenafil (VIAGRA) 100 MG tablet   hydrochlorothiazide (HYDRODIURIL) 25 MG tablet   losartan (COZAAR) 100 MG tablet   Medication refill       Relevant Medications   DULoxetine (CYMBALTA) 20 MG capsule   ibuprofen (ADVIL) 600 MG tablet     Diagnoses and all orders for this visit:  Morbid obesity ([place]) Discussed diet and exercise for person with BMI >25. Instructed: You must burn more calories than you eat. Losing 5 percent of your body weight should be considered a success. In the longer term, losing more than 15 percent of your body weight and staying at this weight is an extremely good result. However, keep in mind that even losing 5 percent of your body weight leads to important health benefits, so try not to get discouraged if you're not able to lose more than this. Will recheck weight in 3-6 months.  Erectile dysfunction, unspecified erectile dysfunction type This is a NOT a new problem. Additional work-up: is not needed.  We discussed different medications Viagra. We also discussed the risks of such medications, including priapism, monocular vision loss, and hypotension with nitrates. All questions were answered. The patient opted for:  Hypertension, unspecified type Goal BP:  For patients younger than 60: Goal BP < 130/90 For patients 60 and older: Goal BP < 150/90. For patients with diabetes: Goal BP < 140/90. Your most recent BP: 122/67 UNREMARAKABLE  Take your medications faithfully as instructed. Maintain a healthy weight. Get at least 150 minutes of aerobic exercise per week. Minimize salt intake. Minimize alcohol intake  -     metoprolol succinate (TOPROL-XL) 50 MG 24 hr tablet; TAKE 1 TABLET BY MOUTH DAILY. TAKE WITH OR IMMEDIATELY FOLLOWING A MEAL. -     losartan (COZAAR) 100 MG tablet; Take 1 tablet (100 mg total) by mouth daily.  Medication refill -     DULoxetine (CYMBALTA) 20 MG capsule; Take 2 capsules (40 mg total) by mouth daily. -     ibuprofen (ADVIL) 600 MG tablet; Take 1 tablet (600 mg total) by mouth every 8 (eight) hours as needed.  Other orders -     sildenafil (VIAGRA) 100 MG tablet; Take 0.5-1 tablets (50-100 mg total) by mouth daily as needed for erectile dysfunction. -     hydrOXYzine (ATARAX/VISTARIL) 50 MG tablet; Take 1 tablet (50 mg total) by mouth at bedtime. -     topiramate (TOPAMAX) 50 MG tablet; Take 1 tablet (50 mg total) by mouth every evening. -  metFORMIN (GLUCOPHAGE-XR) 750 MG 24 hr tablet; Take 1 tablet (750 mg total) by mouth daily with breakfast. -     hydrochlorothiazide (HYDRODIURIL) 25 MG tablet; Take 1 tablet (25 mg total) by mouth daily.   Meds ordered this encounter  Medications  . metoprolol succinate (TOPROL-XL) 50 MG 24 hr tablet    Sig: TAKE 1 TABLET BY MOUTH DAILY. TAKE WITH OR IMMEDIATELY FOLLOWING A MEAL.    Dispense:  30 tablet    Refill:  3  . sildenafil (VIAGRA) 100 MG tablet    Sig: Take 0.5-1 tablets (50-100 mg total) by mouth daily  as needed for erectile dysfunction.    Dispense:  5 tablet    Refill:  11  . DULoxetine (CYMBALTA) 20 MG capsule    Sig: Take 2 capsules (40 mg total) by mouth daily.    Dispense:  60 capsule    Refill:  3  . hydrOXYzine (ATARAX/VISTARIL) 50 MG tablet    Sig: Take 1 tablet (50 mg total) by mouth at bedtime.    Dispense:  30 tablet    Refill:  3  . topiramate (TOPAMAX) 50 MG tablet    Sig: Take 1 tablet (50 mg total) by mouth every evening.    Dispense:  10 tablet    Refill:  2  . ibuprofen (ADVIL) 600 MG tablet    Sig: Take 1 tablet (600 mg total) by mouth every 8 (eight) hours as needed.    Dispense:  30 tablet    Refill:  1  . metFORMIN (GLUCOPHAGE-XR) 750 MG 24 hr tablet    Sig: Take 1 tablet (750 mg total) by mouth daily with breakfast.    Dispense:  30 tablet    Refill:  3  . hydrochlorothiazide (HYDRODIURIL) 25 MG tablet    Sig: Take 1 tablet (25 mg total) by mouth daily.    Dispense:  30 tablet    Refill:  3  . losartan (COZAAR) 100 MG tablet    Sig: Take 1 tablet (100 mg total) by mouth daily.    Dispense:  30 tablet    Refill:  3    Follow-up: Return in about 3 months (around 08/13/2019) for T2D, HTN.    Grayce SessionsMichelle P Mauri Temkin, NP

## 2019-05-15 MED FILL — DULoxetine HCL 20 MG CPEP: 20 | 30 days supply | Qty: 60 | Fill #0

## 2019-05-15 MED FILL — METFORMIN HCL ER 750 MG TAB: 750 | 30 days supply | Qty: 30 | Fill #0

## 2019-05-15 MED FILL — TOPIRAMATE 50 MG TABLET: 50 | 30 days supply | Qty: 30 | Fill #0

## 2019-05-15 MED FILL — METOPROLOL SUCCINATE ER 50: 50 | 30 days supply | Qty: 30 | Fill #0

## 2019-06-06 ENCOUNTER — Telehealth (INDEPENDENT_AMBULATORY_CARE_PROVIDER_SITE_OTHER): Payer: Self-pay

## 2019-06-06 NOTE — Telephone Encounter (Signed)
Patient called to make a medication refill for   sildenafil (VIAGRA) 100 MG tablet    [facility] uses [facility] [address]   Please advice [phone]

## 2019-06-07 MED ORDER — SILDENAFIL CITRATE 100 MG PO TABS: 50.0000 mg | ORAL_TABLET | Freq: Every day | ORAL | 2 refills | Status: DC | PRN

## 2019-06-07 NOTE — Telephone Encounter (Signed)
Patient is aware that medication has been sent to [facility]. Nat Christen, CMA

## 2019-06-13 ENCOUNTER — Ambulatory Visit (INDEPENDENT_AMBULATORY_CARE_PROVIDER_SITE_OTHER): Payer: Medicaid Other | Admitting: Primary Care

## 2019-08-13 ENCOUNTER — Ambulatory Visit (INDEPENDENT_AMBULATORY_CARE_PROVIDER_SITE_OTHER): Payer: Medicaid Other | Admitting: Primary Care

## 2019-08-20 ENCOUNTER — Other Ambulatory Visit: Payer: Self-pay

## 2019-08-20 ENCOUNTER — Ambulatory Visit (INDEPENDENT_AMBULATORY_CARE_PROVIDER_SITE_OTHER): Payer: Medicaid Other | Admitting: Primary Care

## 2019-08-20 ENCOUNTER — Other Ambulatory Visit (INDEPENDENT_AMBULATORY_CARE_PROVIDER_SITE_OTHER): Payer: Self-pay | Admitting: Primary Care

## 2019-08-20 ENCOUNTER — Encounter (INDEPENDENT_AMBULATORY_CARE_PROVIDER_SITE_OTHER): Payer: Self-pay | Admitting: Primary Care

## 2019-08-20 DIAGNOSIS — M542 Cervicalgia: Secondary | ICD-10-CM | POA: Diagnosis not present

## 2019-08-20 DIAGNOSIS — M791 Myalgia, unspecified site: Secondary | ICD-10-CM

## 2019-08-20 DIAGNOSIS — I10 Essential (primary) hypertension: Secondary | ICD-10-CM | POA: Diagnosis not present

## 2019-08-20 DIAGNOSIS — M5489 Other dorsalgia: Secondary | ICD-10-CM

## 2019-08-20 DIAGNOSIS — Z0130 Encounter for examination of blood pressure without abnormal findings: Secondary | ICD-10-CM

## 2019-08-20 MED ORDER — BLOOD PRESSURE MONITOR DELUXE KIT: 1.0000 | PACK | Freq: Three times a day (TID) | 0 refills | Status: AC

## 2019-08-20 MED ORDER — HYDROCHLOROTHIAZIDE 25 MG PO TABS: 25.0000 mg | ORAL_TABLET | Freq: Every day | ORAL | 5 refills | Status: AC

## 2019-08-20 MED FILL — HYDROCHLOROTHIAZIDE 25 MG T: 25 | 90 days supply | Qty: 90 | Fill #0

## 2019-08-20 NOTE — Progress Notes (Signed)
Pt unable to check BP at home
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
## 2019-08-22 ENCOUNTER — Other Ambulatory Visit: Payer: Self-pay

## 2019-08-26 ENCOUNTER — Ambulatory Visit (INDEPENDENT_AMBULATORY_CARE_PROVIDER_SITE_OTHER): Payer: Medicaid Other | Admitting: Endocrinology

## 2019-08-26 ENCOUNTER — Encounter: Payer: Self-pay | Admitting: Endocrinology

## 2019-08-26 VITALS — BP 150/70 | HR 104 | Ht 69.0 in | Wt 280.2 lb

## 2019-08-26 DIAGNOSIS — E041 Nontoxic single thyroid nodule: Secondary | ICD-10-CM

## 2019-08-26 DIAGNOSIS — E0590 Thyrotoxicosis, unspecified without thyrotoxic crisis or storm: Secondary | ICD-10-CM | POA: Diagnosis not present

## 2019-08-26 LAB — T3, FREE: T3, Free: 3.5 pg/mL (ref 2.3–4.2)

## 2019-08-26 LAB — TSH: TSH: 1.43 u[IU]/mL (ref 0.35–4.50)

## 2019-08-26 LAB — T4, FREE: Free T4: 0.76 ng/dL (ref 0.60–1.60)

## 2019-08-26 NOTE — Patient Instructions (Signed)
Stop Phenteremine   What is hyperthyroidism?  Hyperthyroidism develops when the body is exposed to excessive amounts of thyroid hormone. This disorder occurs in almost one percent of all Americans and affects women five to 10 times more often than men. In its mildest form, hyperthyroidism may not cause recognizable symptoms. More often, however, the symptoms are discomforting, disabling or even life-threatening.  What are the causes of hyperthyroidism?  Luiz Blare' disease: Graves' disease (named after [place] physician Sloan Leiter) is an autoimmune disorder that frequently results in thyroid enlargement and hyperthyroidism. In some patients, swelling of the muscles and other tissues around the eyes may develop, causing eye prominence, discomfort or double vision. Like other autoimmune diseases, this condition tends to affect multiple family members. It is much more common in women than in men and tends to occur in younger patients. .   . . Toxic multinodular goiter: Multiple nodules in the thyroid can produce excessive thyroid hormone, causing hyperthyroidism. Typically diagnosed in patients over the age of 19, this disorder is more likely to affect heart rhythm. In many cases, the person has had the goiter for many years before it becomes overactive. . Toxic nodule: A single nodule or lump in the thyroid can also produce more thyroid hormone than the body requires and lead to hyperthyroidism. This disorder is not familial. .     What are the signs and symptoms of hyperthyroidism? When hyperthyroidism develops, a goiter (enlargement of the thyroid) is usually present and may be associated with some or many of the following features: . Fast heart rate, often more than 100 beats per minute . Becoming anxious, irritable, argumentative . Trembling hands . Weight loss, despite eating the same amount or even more than usual . Intolerance of warm temperatures and increased likelihood to  perspire . Loss of scalp hair . Tendency of fingernails to separate from the nail bed . Muscle weakness, especially of the upper arms and thighs . Loose and frequent bowel movements . Smooth skin . Change in menstrual pattern . Increased likelihood for miscarriage . Prominent "stare" of the eyes . Protrusion of the eyes, with or without double vision (in patients with Graves' disease) . Irregular heart rhythm, especially in patients older than 62 years of age . Accelerated loss of calcium from bones, which increases the risk of osteoporosis and fractures   How is hyperthyroidism diagnosed? Sometimes a general physician can diagnose and treat the cause of hyperthyroidism, but assistance is often needed from an endocrinologist, a physician who specializes in managing thyroid disease. Characteristic symptoms and physical signs of the disease can be detected by a trained physician. In addition, tests can be used to confirm the diagnosis and to determine the cause.  Tests TSH (THYROID-STIMULATING HORMONE OR THYROTROPIN): A low TSH level in the blood is the most accurate indicator of hyperthyroidism. The body shuts off production of this pituitary hormone when the thyroid gland even slightly overproduces thyroid hormone. If the TSH level is low, it is very important to also check thyroid hormone levels to confirm the diagnosis of hyperthyroidism.  ESTIMATES OF FREE THYROXINE AND FREE TRIIODOTHYRONINE: When hyperthyroidism develops, free thyroxine and free triiodothyronine levels rise above previous values in that specific patient (although they may still fall within the normal range for the general population) and are often considerably elevated.   RADIOACTIVE IODINE UPTAKE (RAIU): The amount of iodine the thyroid gland can collect, and a thyroid scan, which shows how the iodine is distributed throughout the thyroid gland.  THYROID SCAN: This information can be useful in determining the cause of  hyperthyroidism and, ultimately, its treatment.   How is hyperthyroidism treated? Appropriate management of hyperthyroidism requires careful evaluation and ongoing care by a physician experienced in the treatment of this complex condition. Before the development of current treatment options, the death rate from severe hyperthyroidism was as high as 50 percent. Now several effective treatments are available and, with proper management, death from hyperthyroidism is rare. Deciding which treatment is best depends on what caused the hyperthyroidism, its severity and other conditions present.   . Antithyroid Drugs In the [place], two drugs are available for treating hyperthyroidism: propylthiouracil (PTU) and methimazole (Tapazole). Except for early pregnancy, methimazole is preferred because PTU can cause fatal liver damage, although rarely. These medications control hyperthyroidism by slowing thyroid hormone production. They may take several months to normalize thyroid hormone levels. Some patients with hyperthyroidism caused by Graves' disease experience a spontaneous or natural remission of hyperthyroidism after a 12- to 6015-month course of treatment with these drugs and may sometimes avoid permanent underactivity of the thyroid (hypothyroidism), which often occurs as a result of using the other methods of treating hyperthyroidism. Unfortunately, the remission is frequently only temporary, with the hyperthyroidism recurring after several months or years off medication and requiring additional treatment, so relatively few patients are treated solely with antithyroid medication in the [place]. Antithyroid drugs may cause an allergic reaction in about five percent of patients who use them. This usually occurs during the first six weeks of drug treatment. Such a reaction may include rash or hives; but after discontinuing use of the drug, the symptoms resolve within one to two weeks and there is no  permanent damage. A more serious side effect, but occurring in only about one in 250-500 patients during the first four to eight weeks of treatment, is a rapid decrease of white blood cells in the bloodstream. This could increase susceptibility to serious infection. Symptoms such as a sore throat, infection or fever should be reported promptly to your physician, and a white blood cell count should be done immediately. In nearly every case, when a person stops using the medication, the white blood cell count returns to normal. Very rarely, antithyroid drugs may cause severe liver problems, which can be detected by monitoring blood tests or joint problems characterized by joint pain and/or swelling. Your physician should be contacted if there is yellowing of the skin (jaundice), fever, loss of appetite or abdominal pain.  . Radioactive Iodine Treatment Iodine is an essential ingredient in the production of thyroid hormone. Each molecule of thyroid hormone contains either four (T4) or three (T3) molecules of iodine. Since most overactive thyroid glands are quite hungry for iodine, it was discovered in the 1940s that the thyroid could be "tricked" into destroying itself by simply feeding it radioactive iodine. The radioactive iodine is given by mouth, usually in capsule form, and is quickly absorbed from the bowel. It then enters the thyroid cells from the bloodstream and gradually destroys them. Maximal benefit is usually noted within three to six months. It is not possible to eliminate "just the right amount" of the diseased thyroid gland, since radioiodine eventually damages all thyroid cells. Therefore, most endocrinologists strive to completely destroy the diseased thyroid gland with a single dose of radioiodine. This results in the intentional development of an underactive thyroid state (hypothyroidism), which is easily, predictably and inexpensively corrected by lifelong daily use of oral thyroid hormone  replacement therapy.  Although every effort is made to calculate the correct dose of radioiodine for each patient, not every treatment will successfully correct the hyperthyroidism, particularly if the goiter is quite large and a second dose of radioactive iodine is occasionally needed. Thousands of patients have received radioiodine treatment, including former Software engineer of the [employer] and his wife, Pamala Hurry. The treatment is a very safe, simple and reliably effective one. Because of this, it is considered by most thyroid specialists in the [place] to be the treatment of choice for hyperthyroidism cases caused by overproduction of thyroid hormone. . Surgical Removal of the Thyroid Although seldom used now as the preferred treatment for hyperthyroidism, operating to remove most of the thyroid gland may occasionally be recommended in certain situations, such as a pregnant woman with severe uncontrolled disease in whom radioiodine would not be safe for the baby. Surgery usually leads to permanent hypothyroidism and lifelong thyroid hormone replacement therapy. . Other Treatments A drug from the class of beta-adrenergic blocking agents (which decrease the effects of excess thyroid hormone) may be used temporarily to control hyperthyroid symptoms until other therapies take effect. In cases where hyperthyroidism is caused by thyroiditis or excessive ingestion of either iodine or thyroid hormone, this may be the only type of treatment required. Iodine drops are prescribed when hyperthyroidism is severe or prior to undergoing surgery for Graves' disease.

## 2019-08-26 NOTE — Progress Notes (Addendum)
Patient ID: Thomas Medina, male   DOB: 1956-05-04, 63 y.o.   MRN: [id number]                                                                                                               Reason for Appointment:  Hyperthyroidism, new consultation  Referring healthcare provider: Simona Huh, NP   Chief complaint: Sweating spells   History of Present Illness:   For the last 5 to 6 months he complains of periodic sweating does not however otherwise complain of heat intolerance the patient has not had symptoms of palpitations, shakiness, unusual nervousness, and no fatigue.   He thinks he is gradually gaining weight for some time although recent weight fluctuates  Previously appears to have had routine thyroid levels checked in 7/20 and was found to have a suppressed TSH of 0.007 with normal free T4 and T3 He is not clear whether he has had any repeat thyroid levels since then  He also has had a thyroid ultrasound through his PCP but results are not available at present  Wt Readings from Last 3 Encounters:  08/26/19 280 lb 3.2 oz (127.1 kg)  05/14/19 264 lb 3.2 oz (119.8 kg)  04/23/19 271 lb 6.4 oz (123.1 kg)     Treatments so far: None  Thyroid function tests as follows:     Lab Results  Component Value Date   TSH 2.540 04/18/2017   TSH 1.380 03/27/2017    No results found for: THYROTRECAB   Allergies as of 08/26/2019   No Known Allergies     Medication List       Accurate as of August 26, 2019 11:08 AM. If you have any questions, ask your nurse or doctor.        STOP taking these medications   allopurinol 100 MG tablet Commonly known as: ZYLOPRIM Stopped by: Elayne Snare, MD     TAKE these medications   Belbuca 450 MCG Film Generic drug: Buprenorphine HCl TK 1 FILM INSIDE CHEEKS Q 12 H   Blood Pressure Monitor Deluxe Kit 1 kit by Does not apply route 3 (three) times daily.   docusate sodium 283 MG enema Commonly known as: ENEMEEZ Place 1 enema  (283 mg total) rectally daily.   DULoxetine 20 MG capsule Commonly known as: CYMBALTA Take 2 capsules (40 mg total) by mouth daily.   hydrochlorothiazide 25 MG tablet Commonly known as: HYDRODIURIL Take 1 tablet (25 mg total) by mouth daily.   hydrOXYzine 50 MG tablet Commonly known as: ATARAX/VISTARIL Take 1 tablet (50 mg total) by mouth at bedtime.   ibuprofen 600 MG tablet Commonly known as: ADVIL Take 1 tablet (600 mg total) by mouth every 8 (eight) hours as needed.   losartan 100 MG tablet Commonly known as: COZAAR Take 1 tablet (100 mg total) by mouth daily.   lovastatin 20 MG tablet Commonly known as: MEVACOR Take 1 tablet (20 mg total) by mouth at bedtime.   metFORMIN 750 MG 24 hr tablet Commonly known as: GLUCOPHAGE-XR Take  1 tablet (750 mg total) by mouth daily with breakfast.   metoprolol succinate 50 MG 24 hr tablet Commonly known as: TOPROL-XL TAKE 1 TABLET BY MOUTH DAILY. TAKE WITH OR IMMEDIATELY FOLLOWING A MEAL.   omega-3 acid ethyl esters 1 g capsule Commonly known as: LOVAZA Take 2 capsules (2 g total) by mouth 2 (two) times daily.   omeprazole 20 MG capsule Commonly known as: PRILOSEC Take 20 mg by mouth daily.   oxyCODONE 5 MG immediate release tablet Commonly known as: Oxy IR/ROXICODONE TK 1 T PO TID PRN   phentermine 37.5 MG tablet Commonly known as: ADIPEX-P TK 1 T PO QD   polyethylene glycol 17 g packet Commonly known as: MIRALAX / GLYCOLAX Take 17 g by mouth daily.   sildenafil 100 MG tablet Commonly known as: Viagra Take 0.5-1 tablets (50-100 mg total) by mouth daily as needed for erectile dysfunction.   Symbicort 160-4.5 MCG/ACT inhaler Generic drug: budesonide-formoterol INHALE TWO PUFFS INTO THE LUNGS TWICE DAILY   topiramate 50 MG tablet Commonly known as: TOPAMAX Take 1 tablet (50 mg total) by mouth every evening.   Vitamin D (Ergocalciferol) 1.25 MG (50000 UT) Caps capsule Commonly known as: DRISDOL Take 1,250  capsules by mouth once a week.           Past Medical History:  Diagnosis Date  . Depression   . Diabetes mellitus without complication ([place])   . Hypertension   . PTSD (post-traumatic stress disorder)   . Sleep apnea   . Vision abnormalities     Past Surgical History:  Procedure Laterality Date  . POLYPECTOMY      Family History  Problem Relation Age of Onset  . Aneurysm Mother   . Heart attack Father   . Hypertension Father   . Hyperlipidemia Father   . Renal cancer Brother   . Kidney disease Brother     Social History:  reports that he has been smoking cigarettes. He has been smoking about 0.25 packs per day. He has never used smokeless tobacco. He reports current alcohol use. He reports that he does not use drugs.  Allergies: No Known Allergies   Review of Systems  Constitutional: Positive for weight gain and diaphoresis.  HENT: Negative for hoarseness and trouble swallowing.   Eyes: Negative for visual disturbance.  Respiratory: Positive for daytime sleepiness and shortness of breath.   Cardiovascular: Negative for palpitations.  Gastrointestinal: Negative for constipation and diarrhea.  Endocrine: Positive for heat intolerance and erectile dysfunction. Negative for fatigue.  Musculoskeletal: Negative for joint pain.  Neurological: Negative for numbness.  Psychiatric/Behavioral: Positive for insomnia.   He has had diabetes since about 2018 Only on Metformin Last A1c was 6.4 has been as high as 7.5 previously  He has been on phentermine since about 09/2018 for weight loss but he thinks he is gaining a pound a month    Examination:   BP (!) 150/70 (BP Location: Left Arm, Patient Position: Sitting, Cuff Size: Large)   Pulse (!) 104   Ht '5\' 9"'$  (1.753 m)   Wt 280 lb 3.2 oz (127.1 kg)   SpO2 93%   BMI 41.38 kg/m    General Appearance:  He has generalized obesity. Patient is pleasant, not anxious or hyperkinetic.         Eyes: No abnormal prominence, lid  lag or stare present.  No swelling of the eyelids   Neck: The thyroid is nonpalpable on the right Appears to have 2 firm nodules about  1.5-2 cm on the left lobe especially laterally, smooth and palpable mostly on swallowing   There is no lymphadenopathy in the neck .           Heart: normal S1 and S2, no murmurs .          Lungs: breath sounds are normal bilaterally without added sounds  Abdomen: no hepatosplenomegaly or other palpable abnormality  Extremities: hands are warm but not diaphoretic.  No ankle edema.  Neurological:  No fine tremors are present. Deep tendon reflexes at biceps are slightly brisk.  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, unlikely to be from Graves' disease   Currently records from only 03/2019 are available which showed suppressed TSH and normal T4 and T3 levels He does not have any clinical symptoms suggestive of hyperthyroidism and his sweating episodes may or may not be related, he may also have hypogonadism  His exam shows at least 1 nodule palpable on the left lobe indicating that he may have either multinodular goiter or a hot nodule He also has mild sinus tachycardia but he is also taking phentermine Otherwise his exam does not indicate signs of hyperthyroidism  Recommendations:  Will obtain records from PCP to see any updated thyroid levels are amenable  Review report of thyroid ultrasound when available  We will draw labs for thyroid functions today in case we do not have any updated labs from PCP  Nuclear thyroid scan and uptake to be done  Discussed potential for I-131 ablation of toxic nodule or toxic nodular goiter if his I-131 uptake is upper normal or high  Stop PHENTERMINE as it is contraindicated with hyperthyroidism as also he is not having any weight loss benefits from this.  Also this is not  indicated to be used long-term  Patient handout on hyperthyroidism and radioactive iodine treatment given Patient understands the above discussion and treatment options. All questions were answered satisfactorily  Follow-up to be decided based on above evaluation  Consult note sent to referring physician  Elayne Snare 08/26/2019, 11:08 AM    Note: This office note was prepared with Dragon voice recognition system technology. Any transcriptional errors that result from this process are unintentional.  Addendum:  Thyroid levels are normal now and report of ultrasound indicates no nodules No further work-up or follow-up needed  Lab Results  Component Value Date   TSH 1.43 08/26/2019   TSH 0.01 (A) 04/05/2019   TSH 2.540 04/18/2017   FREET4 0.76 08/26/2019

## 2019-08-27 NOTE — Progress Notes (Signed)
Please call to let patient know that the thyroid results are normal and no thyroid scan or follow-up needed

## 2019-10-07 ENCOUNTER — Other Ambulatory Visit: Payer: Self-pay

## 2019-10-07 ENCOUNTER — Encounter: Payer: Self-pay | Admitting: Cardiology

## 2019-10-07 ENCOUNTER — Ambulatory Visit (INDEPENDENT_AMBULATORY_CARE_PROVIDER_SITE_OTHER): Payer: Medicaid Other | Admitting: Cardiology

## 2019-10-07 VITALS — BP 166/68 | HR 94 | Temp 97.8°F | Ht 69.0 in | Wt 288.8 lb

## 2019-10-07 DIAGNOSIS — Z716 Tobacco abuse counseling: Secondary | ICD-10-CM | POA: Diagnosis not present

## 2019-10-07 DIAGNOSIS — E8881 Metabolic syndrome: Secondary | ICD-10-CM

## 2019-10-07 DIAGNOSIS — Z713 Dietary counseling and surveillance: Secondary | ICD-10-CM

## 2019-10-07 DIAGNOSIS — Z6841 Body Mass Index (BMI) 40.0 and over, adult: Secondary | ICD-10-CM

## 2019-10-07 DIAGNOSIS — Z8249 Family history of ischemic heart disease and other diseases of the circulatory system: Secondary | ICD-10-CM

## 2019-10-07 DIAGNOSIS — Z9989 Dependence on other enabling machines and devices: Secondary | ICD-10-CM

## 2019-10-07 DIAGNOSIS — E782 Mixed hyperlipidemia: Secondary | ICD-10-CM

## 2019-10-07 DIAGNOSIS — R600 Localized edema: Secondary | ICD-10-CM | POA: Diagnosis not present

## 2019-10-07 DIAGNOSIS — Z7189 Other specified counseling: Secondary | ICD-10-CM

## 2019-10-07 DIAGNOSIS — F17210 Nicotine dependence, cigarettes, uncomplicated: Secondary | ICD-10-CM | POA: Diagnosis not present

## 2019-10-07 DIAGNOSIS — I10 Essential (primary) hypertension: Secondary | ICD-10-CM | POA: Diagnosis not present

## 2019-10-07 DIAGNOSIS — Z7182 Exercise counseling: Secondary | ICD-10-CM

## 2019-10-07 MED ORDER — CARVEDILOL 6.25 MG PO TABS: 6.2500 mg | ORAL_TABLET | Freq: Two times a day (BID) | ORAL | 3 refills | Status: DC

## 2019-10-07 NOTE — Patient Instructions (Signed)
Medication Instructions:  Stop: Metoprolol 50 mg daily Start: Carvedilol 6.25 mg daily  *If you need a refill on your cardiac medications before your next appointment, please call your pharmacy*  Lab Work: None  Testing/Procedures: None  Follow-Up: At [facility], you and your health needs are our priority.  As part of our continuing mission to provide you with exceptional heart care, we have created designated Provider Care Teams.  These Care Teams include your primary Cardiologist (physician) and Advanced Practice Providers (APPs -  Physician Assistants and Nurse Practitioners) who all work together to provide you with the care you need, when you need it.  Your next appointment:   3 month(s)  The format for your next appointment:   In Person  Provider:   Jodelle Red, MD

## 2019-10-07 NOTE — Progress Notes (Signed)
Cardiology Office Note:    Date:  10/07/2019   ID:  Thomas Medina, DOB Jan 05, 1956, MRN [id number]  PCP:  Simona Huh, NP  Cardiologist:  Buford Dresser, MD PhD  Referring MD: Simona Huh, NP   CC: follow up  History of Present Illness:    Thomas Medina is a 64 y.o. male with a hx of type II diabetes, hypertension, sleep apnea who is seen in follow up today. He was seen as a new consult on 04/06/18. At that time, noted about 7 mos of stable LE edema.   Today: losing weight intentionally. Working on exercise, diet. He continues to have an intermittent cough, productive of clear/white sputum. No fevers/chills, no sick contacts. No known allergies. LE edema is improving. Compression stockings working well. Furosemide changed to HCTZ, doing well on this. Cutting back on smoking, down to 2-3 cigarettes/day and some days doesn't smoke at all. Using BiPAP at night, though not every night (when he is coughing he can't use it). Has an albuterol inhaler at home, doesn't use regularly. Had PFTs done about two weeks ago, which is when he was started on at that time. Sees Dr. Glade Lloyd at [facility] for this.  Denies chest pain, shortness of breath at rest or with normal exertion. No PND, orthopnea, or unexpected weight gain. No syncope or palpitations.  Today: Has gained about 30 pounds of weight since the pandemic. He admits to diet indiscretion but has been trying to portion control himself. Walks at home, about 1/4 mile round trip. Tries to walk around. No chest pain, but has back pain that bothers him.   Breathing is good, using BiPAP at night. Has DOE but not limiting, trying to increase his conditioning.  Doesn't check blood pressures at home. Doesn't have a kit.  Worried about adding more medications. Ok with swap of carvedilol for metoprolol today, but he'd like to try to not add more medication if that helps his blood pressure.   Phentermine started this month, he is taking  daily. We discussed that this can also raise blood pressure, as it is a stimulant. He did not know that. He wants to lose weight but will follow up with his PCP to see if this needs to be adjusted.  Wants to quit smoking. Down to 1 pack per week. Discussed methods to try to quit today.   Denies chest pain, shortness of breath at res. No PND, orthopnea, worsening LE edema. No syncope or palpitations.   Past Medical History:  Diagnosis Date  . COPD (chronic obstructive pulmonary disease) ([place])   . Depression   . Diabetes mellitus without complication ([place])   . Hyperlipidemia   . Hypertension   . PTSD (post-traumatic stress disorder)   . Sleep apnea   . Vision abnormalities     Past Surgical History:  Procedure Laterality Date  . POLYPECTOMY      Current Medications: Current Outpatient Medications on File Prior to Visit  Medication Sig  . BELBUCA 450 MCG FILM TK 1 FILM INSIDE CHEEKS Q 12 H  . Blood Pressure Monitoring (BLOOD PRESSURE MONITOR DELUXE) KIT 1 kit by Does not apply route 3 (three) times daily.  Marland Kitchen docusate sodium (ENEMEEZ) 283 MG enema Place 1 enema (283 mg total) rectally daily.  . DULoxetine (CYMBALTA) 20 MG capsule Take 2 capsules (40 mg total) by mouth daily.  . hydrochlorothiazide (HYDRODIURIL) 25 MG tablet Take 1 tablet (25 mg total) by mouth daily.  . hydrOXYzine (ATARAX/VISTARIL) 50 MG tablet Take  1 tablet (50 mg total) by mouth at bedtime.  Marland Kitchen ibuprofen (ADVIL) 600 MG tablet Take 1 tablet (600 mg total) by mouth every 8 (eight) hours as needed.  Marland Kitchen losartan (COZAAR) 100 MG tablet Take 1 tablet (100 mg total) by mouth daily.  Marland Kitchen lovastatin (MEVACOR) 20 MG tablet Take 1 tablet (20 mg total) by mouth at bedtime.  . metFORMIN (GLUCOPHAGE-XR) 750 MG 24 hr tablet Take 1 tablet (750 mg total) by mouth daily with breakfast.  . metoprolol succinate (TOPROL-XL) 50 MG 24 hr tablet TAKE 1 TABLET BY MOUTH DAILY. TAKE WITH OR IMMEDIATELY FOLLOWING A MEAL.  Marland Kitchen omega-3 acid ethyl  esters (LOVAZA) 1 g capsule Take 2 capsules (2 g total) by mouth 2 (two) times daily.  Marland Kitchen omeprazole (PRILOSEC) 20 MG capsule Take 20 mg by mouth daily.  Marland Kitchen oxyCODONE (OXY IR/ROXICODONE) 5 MG immediate release tablet TK 1 T PO TID PRN  . phentermine (ADIPEX-P) 37.5 MG tablet TK 1 T PO QD  . polyethylene glycol (MIRALAX / GLYCOLAX) packet Take 17 g by mouth daily.  . sildenafil (VIAGRA) 100 MG tablet Take 0.5-1 tablets (50-100 mg total) by mouth daily as needed for erectile dysfunction.  . SYMBICORT 160-4.5 MCG/ACT inhaler INHALE TWO PUFFS INTO THE LUNGS TWICE DAILY  . topiramate (TOPAMAX) 50 MG tablet Take 1 tablet (50 mg total) by mouth every evening.  . Vitamin D, Ergocalciferol, (DRISDOL) 1.25 MG (50000 UT) CAPS capsule Take 1,250 capsules by mouth once a week.   No current facility-administered medications on file prior to visit.    Allergies:   Patient has no known allergies.   Social History   Tobacco Use  . Smoking status: Current Every Day Smoker    Packs/day: 0.25    Types: Cigarettes  . Smokeless tobacco: Never Used  Substance Use Topics  . Alcohol use: Yes    Comment: occ  . Drug use: No    Family History: The patient's family history includes Aneurysm in his mother; Heart attack in his father; Hyperlipidemia in his father; Hypertension in his father; Kidney disease in his brother; Renal cancer in his brother. There is no history of Diabetes or Thyroid disease. Brother had bioprosthetic valve replacement. Father passed from heart attack at age 75. Both of his father's brothers passed away from MI.  ROS:   Please see the history of present illness.  Additional pertinent ROS negative except as noted in HPI.  EKGs/Labs/Other Studies Reviewed:    The following studies were reviewed today: Echo 04/12/18 Study Conclusions  - Left ventricle: The cavity size was normal. Wall thickness was   increased in a pattern of mild LVH. Systolic function was   vigorous. The estimated  ejection fraction was in the range of 65%   to 70%. Wall motion was normal; there were no regional wall   motion abnormalities. Doppler parameters are consistent with   abnormal left ventricular relaxation (grade 1 diastolic   dysfunction). The E/e&' ratio is >15, suggesting elevated LV   filling pressure. - Mitral valve: Mildly thickened leaflets . There was trivial   regurgitation. - Left atrium: The atrium was normal in size. - Right atrium: The atrium was mildly dilated. - Tricuspid valve: There was trivial regurgitation. - Pulmonary arteries: PA peak pressure: 12 mm Hg (S). - Inferior vena cava: The vessel was normal in size. The   respirophasic diameter changes were in the normal range (= 50%),   consistent with normal central venous pressure.  Impressions: -  LVEF 65-70%, mild LVH, normal wall motion, grade 1 DD, elevated   LV filling pressure, trivial MR, normal LA size, mild RAE,   trivial TR, RVSP 12 mmHg, normal IVC.  EKG:  ECG personally reviewed. The ekg ordered today demonstrates normal sinus rhythm  Recent Labs: 08/26/2019: TSH 1.43  Recent Lipid Panel    Component Value Date/Time   CHOL 168 04/03/2018 1132   TRIG 218 (H) 04/03/2018 1132   HDL 32 (L) 04/03/2018 1132   CHOLHDL 5.3 (H) 04/03/2018 1132   LDLCALC 92 04/03/2018 1132    Physical Exam:    VS:  BP (!) 166/68 (BP Location: Left Arm, Patient Position: Sitting, Cuff Size: Large)   Pulse 94   Temp 97.8 F (36.6 C)   Ht '5\' 9"'$  (1.753 m)   Wt 288 lb 12.8 oz (131 kg)   SpO2 91%   BMI 42.65 kg/m     Wt Readings from Last 3 Encounters:  10/07/19 288 lb 12.8 oz (131 kg)  08/26/19 280 lb 3.2 oz (127.1 kg)  05/14/19 264 lb 3.2 oz (119.8 kg)    GEN: Well nourished, well developed in no acute distress HEENT: Normal, moist mucous membranes NECK: No JVD CARDIAC: regular rhythm, normal S1 and S2, no rubs or gallops. No murmur. VASCULAR: Radial and DP pulses 2+ bilaterally. No carotid bruits RESPIRATORY:   Clear to auscultation without rales, wheezing or rhonchi  ABDOMEN: Soft, non-tender, non-distended MUSCULOSKELETAL:  Ambulates independently SKIN: Warm and dry, bilateral trace LE edema NEUROLOGIC:  Alert and oriented x 3. No focal neuro deficits noted. PSYCHIATRIC:  Normal affect   ASSESSMENT:    1. Essential hypertension   2. Tobacco abuse counseling   3. Bilateral leg edema   4. Metabolic syndrome   5. Mixed hyperlipidemia   6. Morbid obesity ([place])   7. Cardiac risk counseling   8. Counseling on health promotion and disease prevention    PLAN:    Hypertension: actually increased on recheck after having him sit for a time -continue salt avoidance. He has struggled some with diet but will recommit to this -will ask our social worker if we can send BP cuff to him (large cuff preferable) -counseled on how to check home BP -continue HCTZ 25 mg, losartan 100 mg daily -he is agreeable to changing from metoprolol to carvedilol to see if this helps BP control. Starting carvedilol 6.25 mg BID, will titrate if able -amlodipine is an option, but he has mild chronic LE edema, would need to monitor closely -discussed that phentermine can increase BP as well. He wants to lose weight but will speak to his PCP about this  Chronic bilateral lower extremity edema: much improved from prior -echo with normal systolic function, no significant valve issues, only grade 1 diastolic dysfunction. BNP only 9.6, but may be somewhat falsely low due to obesity. -counseled on dietary recommendations, including salt avoidance.  -continue compression stockings, elevation when not in use -I suspect this is salt retention and venous insufficiency primarily.  TSH normal, no protein in urine, LFTs largely unremarkable except for mild transaminase elevation.   Tobacco cessation: The patient was counseled on tobacco cessation today for 4 minutes.  Counseling included reviewing the risks of smoking tobacco products,  how it impacts the patient's current medical diagnoses and different strategies for quitting.  Pharmacotherapy to aid in tobacco cessation was not prescribed today.  Mixed hyperlipidemia: on lovastatin and lovaza. LDL 92, TG 218, HDL 32 -pattern of dyslipidemia, obesity, diabetes, abdominal  adiposity consistent with metabolic syndrome  Morbid obesity: BMI 42 today. -counseled on diet and activity, he is motivated and working on this. Class 3 obesity, BMI 42.  CV risk counseling and prevention: ASCVD very high risk, discussed importance of controlling risk factors today -recommend heart healthy/Mediterranean diet, with whole grains, fruits, vegetable, fish, lean meats, nuts, and olive oil. Limit salt. -recommend moderate walking, 3-5 times/week for 30-50 minutes each session. Aim for at least 150 minutes.week. Goal should be pace of 3 miles/hours, or walking 1.5 miles in 30 minutes -recommend avoidance of tobacco products. Avoid excess alcohol. -ASCVD risk score: The 10-year ASCVD risk score Mikey Bussing DC Brooke Bonito., et al., 2013) is: 60.2%   Values used to calculate the score:     Age: 70 years     Sex: Male     Is Non-Hispanic African American: Yes     Diabetic: Yes     Tobacco smoker: Yes     Systolic Blood Pressure: 548 mmHg     Is BP treated: Yes     HDL Cholesterol: 32 mg/dL     Total Cholesterol: 168 mg/dL   Plan for follow up: 3 mos to follow up BP or sooner PRN  Medication Adjustments/Labs and Tests Ordered: Current medicines are reviewed at length with the patient today.  Concerns regarding medicines are outlined above.  Orders Placed This Encounter  Procedures  . EKG 12-Lead   Meds ordered this encounter  Medications  . carvedilol (COREG) 6.25 MG tablet    Sig: Take 1 tablet (6.25 mg total) by mouth 2 (two) times daily.    Dispense:  180 tablet    Refill:  3    Patient Instructions  Medication Instructions:  Stop: Metoprolol 50 mg daily Start: Carvedilol 6.25 mg daily  *If  you need a refill on your cardiac medications before your next appointment, please call your pharmacy*  Lab Work: None  Testing/Procedures: None  Follow-Up: At [facility], you and your health needs are our priority.  As part of our continuing mission to provide you with exceptional heart care, we have created designated Provider Care Teams.  These Care Teams include your primary Cardiologist (physician) and Advanced Practice Providers (APPs -  Physician Assistants and Nurse Practitioners) who all work together to provide you with the care you need, when you need it.  Your next appointment:   3 month(s)  The format for your next appointment:   In Person  Provider:   Buford Dresser, MD       Signed, Buford Dresser, MD PhD 10/07/2019   [place]

## 2019-10-08 ENCOUNTER — Telehealth: Payer: Self-pay | Admitting: Licensed Clinical Social Worker

## 2019-10-08 NOTE — Telephone Encounter (Signed)
CSW referred to assist patient with obtaining a BP cuff. CSW contacted patient to inform cuff will be delivered to home. Patient grateful for support and assistance. CSW available as needed. Jackie Florina Glas, LCSW, CCSW-MCS [phone]  

## 2019-10-13 ENCOUNTER — Encounter: Payer: Self-pay | Admitting: Cardiology

## 2019-10-13 DIAGNOSIS — E8881 Metabolic syndrome: Secondary | ICD-10-CM | POA: Insufficient documentation

## 2019-10-14 IMAGING — CR DG ELBOW COMPLETE 3+V*L*
4 series · 4 of 4 positions shown · non-contrast
Comparison: None.

CLINICAL DATA: Left elbow pain and swelling for 2 days. No known
injury.

EXAM:
LEFT ELBOW - COMPLETE 3+ VIEW

[elbow ap]
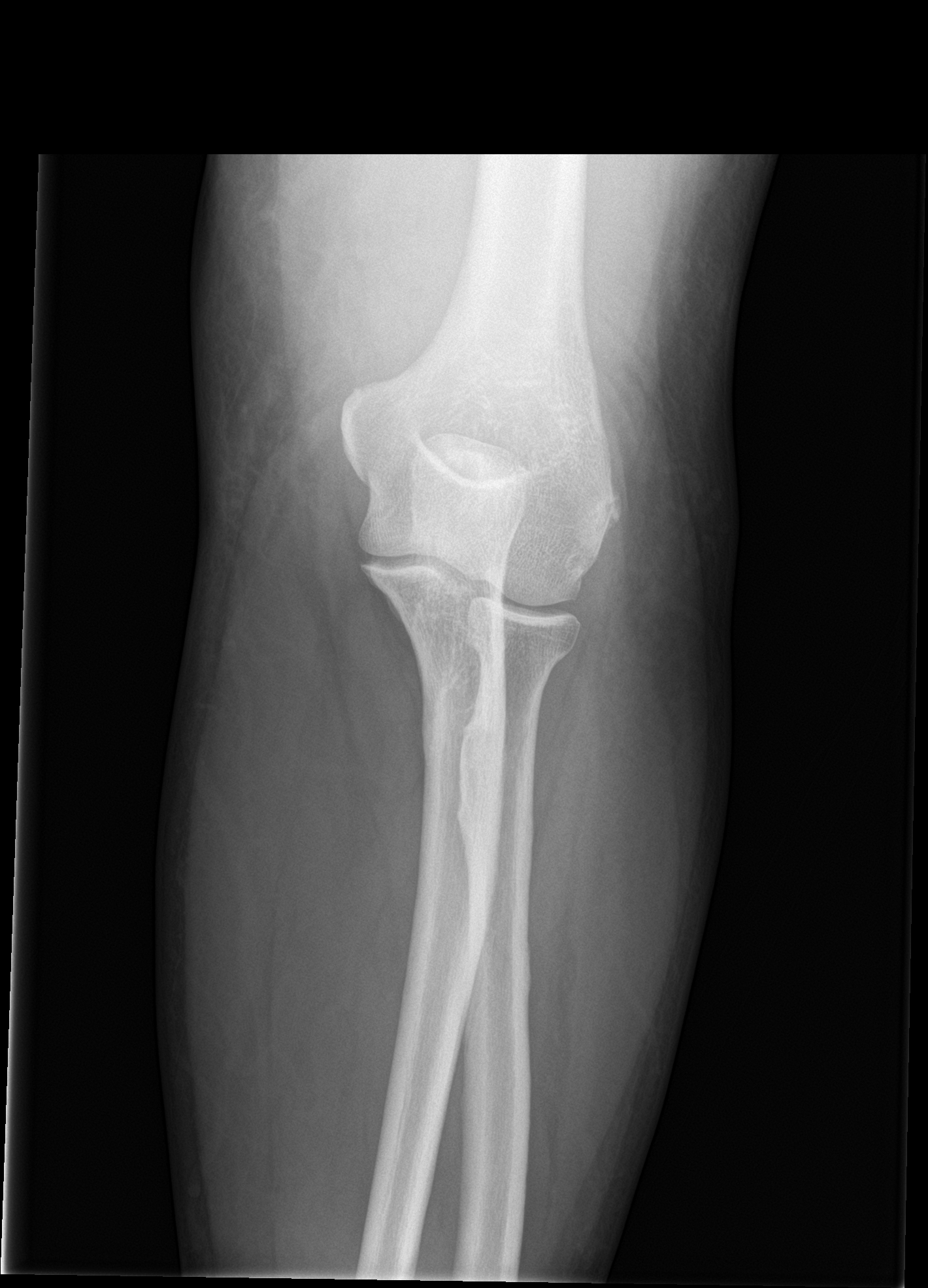

[elbow obl (1 of 2)]
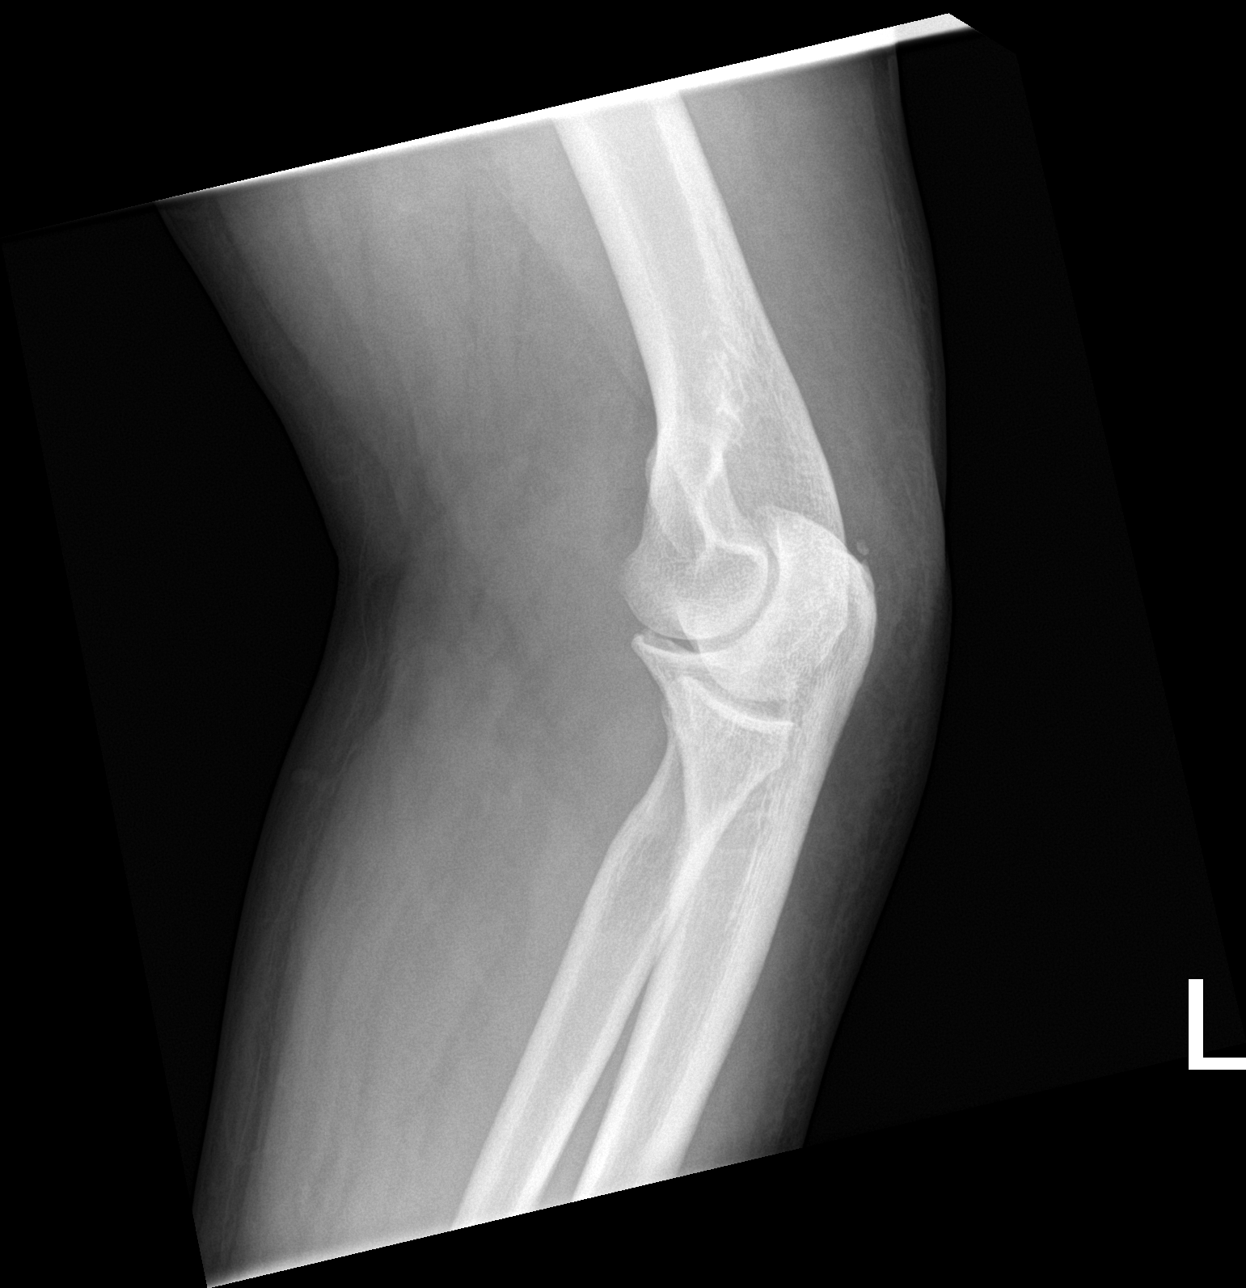

[elbow obl (2 of 2)]
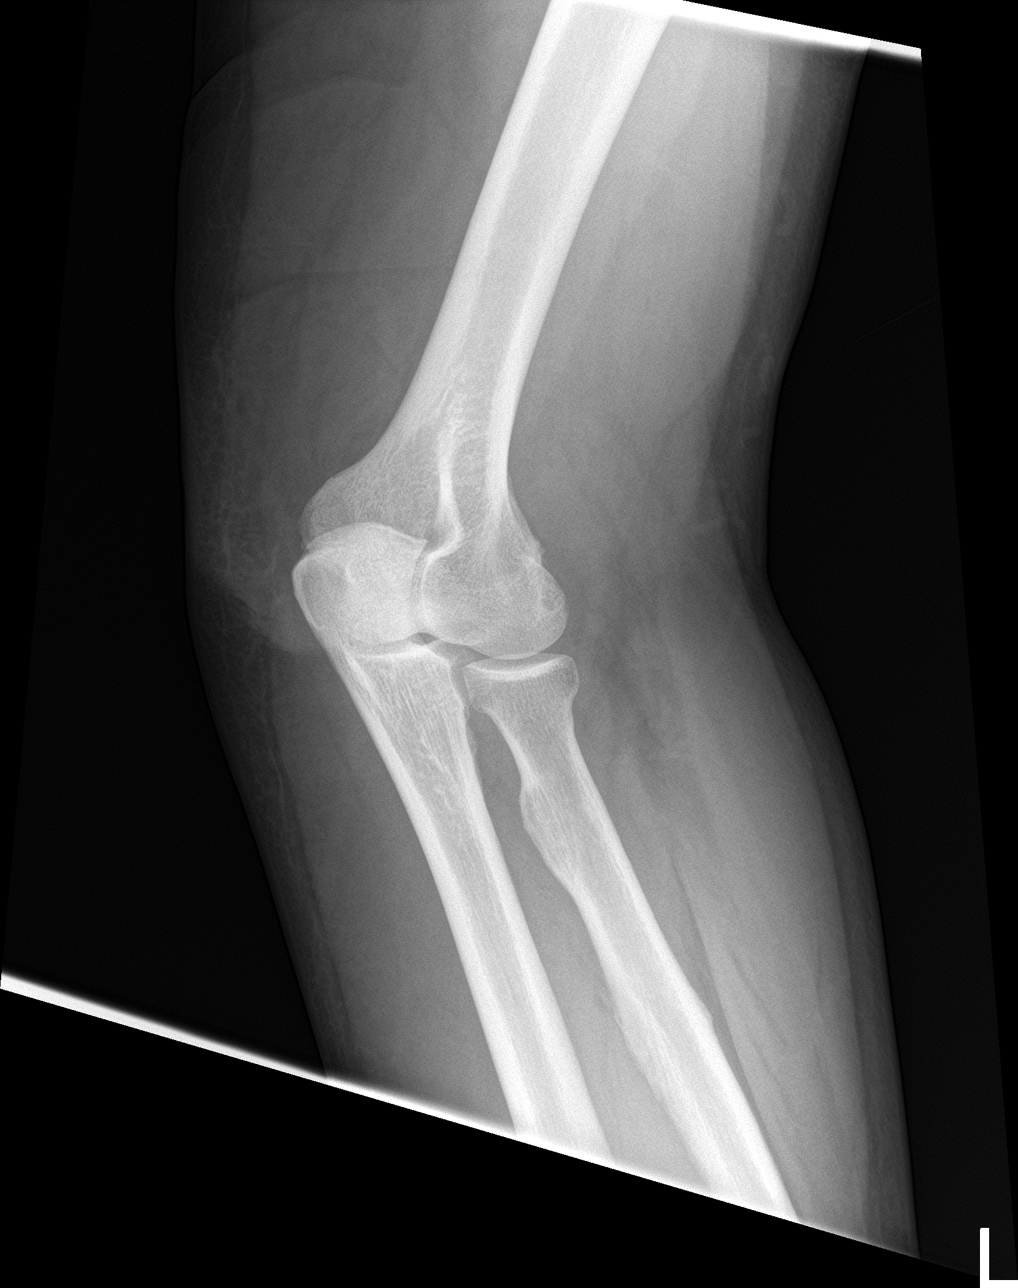

[elbow lat]
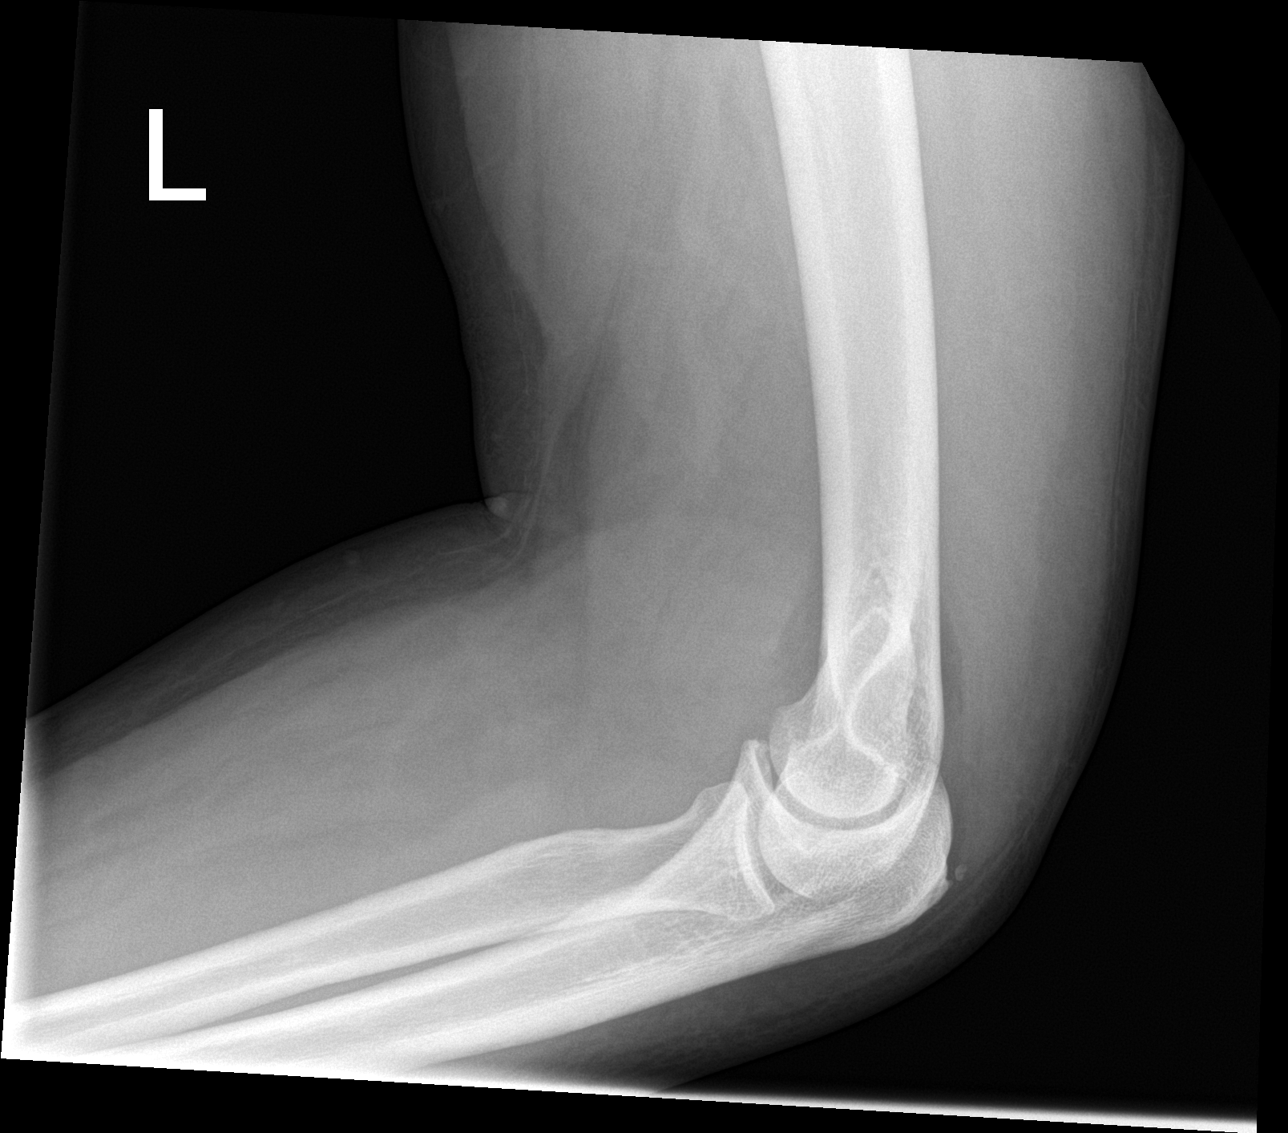

[4 of 4 positions shown; findings below may reference images not displayed]

FINDINGS: Elbow joint effusion is seen. No evidence of fracture or
dislocation. No evidence of arthropathy or other bone lesions.
IMPRESSION: Elbow joint effusion.

No evidence of fracture or other osseous abnormality.

## 2020-01-06 ENCOUNTER — Ambulatory Visit: Payer: Medicaid Other | Admitting: Cardiology

## 2020-01-06 NOTE — Progress Notes (Incomplete)
?  Cardiology Office Note:   ? ?Date:  01/06/2020  ? ?ID:  Thomas Medina, DOB 04-Jan-1956, MRN [id number] ? ?PCP:  Simona Huh, NP  ?Cardiologist:  Buford Dresser, MD PhD ? ?Referring MD: Simona Huh, NP  ? ?CC: follow up ? ?History of Present Illness:   ? ?Thomas Medina is a 64 y.o. male with a hx of type II diabetes, hypertension, sleep apnea who is seen in follow up today. He was seen as a new consult on 04/06/18. At that time, noted about 7 mos of stable LE edema.  ? ?Today: losing weight intentionally. Working on exercise, diet. He continues to have an intermittent cough, productive of clear/white sputum. No fevers/chills, no sick contacts. No known allergies. LE edema is improving. Compression stockings working well. Furosemide changed to HCTZ, doing well on this. Cutting back on smoking, down to 2-3 cigarettes/day and some days doesn't smoke at all. Using BiPAP at night, though not every night (when he is coughing he can't use it). Has an albuterol inhaler at home, doesn't use regularly. Had PFTs done about two weeks ago, which is when he was started on at that time. Sees Dr. Glade Lloyd at [facility] for this. ? ?Denies chest pain, shortness of breath at rest or with normal exertion. No PND, orthopnea, or unexpected weight gain. No syncope or palpitations. ? ?Today: ?Has gained about 30 pounds of weight since the pandemic. He admits to diet indiscretion but has been trying to portion control himself. Walks at home, about 1/4 mile round trip. Tries to walk around. No chest pain, but has back pain that bothers him.  ? ?Breathing is good, using BiPAP at night. Has DOE but not limiting, trying to increase his conditioning. ? ?Doesn't check blood pressures at home. Doesn't have a kit.  Worried about adding more medications. Ok with swap of carvedilol for metoprolol today, but he'd like to try to not add more medication if that helps his blood pressure.  ? ? Phentermine started this month, he is taking daily. We discussed that this can also raise blood pre

## 2020-08-12 ENCOUNTER — Other Ambulatory Visit (HOSPITAL_COMMUNITY)
Admission: RE | Admit: 2020-08-12 | Discharge: 2020-08-12 | Disposition: A | Discharge status: Home / Self Care | Payer: Medicaid Other | Source: Ambulatory Visit | Attending: Oral Surgery | Admitting: Oral Surgery

## 2020-08-12 DIAGNOSIS — Z01812 Encounter for preprocedural laboratory examination: Secondary | ICD-10-CM | POA: Diagnosis not present

## 2020-08-12 DIAGNOSIS — Z20822 Contact with and (suspected) exposure to covid-19: Secondary | ICD-10-CM | POA: Insufficient documentation

## 2020-08-12 LAB — SARS CORONAVIRUS 2 (TAT 6-24 HRS): SARS Coronavirus 2: NEGATIVE

## 2020-08-12 NOTE — H&P (Signed)
HISTORY AND PHYSICAL  Thomas Medina is a 64 y.o. male patient with CC: Painful broken teeth. Referred by general dentist for multiple extractions  No diagnosis found.  Past Medical History:  Diagnosis Date  . COPD (chronic obstructive pulmonary disease) ([place])   . Depression   . Diabetes mellitus without complication ([place])   . Hyperlipidemia   . Hypertension   . PTSD (post-traumatic stress disorder)   . Sleep apnea   . Vision abnormalities     No current facility-administered medications for this encounter.   Current Outpatient Medications  Medication Sig Dispense Refill  . aspirin EC 81 MG tablet Take 81 mg by mouth daily. Swallow whole.    Marland Kitchen BELBUCA 600 MCG FILM Take 600 mcg by mouth every 12 (twelve) hours as needed (Pain).   0  . carvedilol (COREG) 6.25 MG tablet Take 1 tablet (6.25 mg total) by mouth 2 (two) times daily. (Patient taking differently: Take 6.25 mg by mouth daily. ) 180 tablet 3  . diltiazem (CARDIZEM CD) 240 MG 24 hr capsule Take 240 mg by mouth daily.    . DULoxetine (CYMBALTA) 20 MG capsule Take 2 capsules (40 mg total) by mouth daily. 60 capsule 3  . fluticasone (FLONASE) 50 MCG/ACT nasal spray Place 2 sprays into both nostrils at bedtime.    Marland Kitchen ibuprofen (ADVIL) 600 MG tablet Take 1 tablet (600 mg total) by mouth every 8 (eight) hours as needed. (Patient taking differently: Take 600 mg by mouth 3 (three) times daily as needed for moderate pain. ) 30 tablet 1  . metoprolol succinate (TOPROL-XL) 100 MG 24 hr tablet Take 100 mg by mouth daily. for high blood pressure    . oxyCODONE (OXY IR/ROXICODONE) 5 MG immediate release tablet Take 10 mg by mouth 3 (three) times daily as needed for moderate pain or severe pain.   0  . phentermine (ADIPEX-P) 37.5 MG tablet Take 37.5 mg by mouth daily before breakfast.     . pravastatin (PRAVACHOL) 40 MG tablet Take 40 mg by mouth daily.    Marland Kitchen PROAIR HFA 108 (90 Base) MCG/ACT inhaler Inhale 1 puff into the lungs 4 (four) times  daily as needed for shortness of breath.    . Semaglutide, 1 MG/DOSE, (OZEMPIC, 1 MG/DOSE,) 4 MG/3ML SOPN Inject 1 mL into the skin once a week.    . sildenafil (VIAGRA) 100 MG tablet Take 0.5-1 tablets (50-100 mg total) by mouth daily as needed for erectile dysfunction. 5 tablet 2  . SYMBICORT 160-4.5 MCG/ACT inhaler Inhale 2 puffs into the lungs daily as needed (Shortnesss of breath).     . traZODone (DESYREL) 100 MG tablet Take 100 mg by mouth at bedtime as needed for sleep.    Marland Kitchen triamterene-hydrochlorothiazide (MAXZIDE) 75-50 MG tablet Take 1 tablet by mouth daily.    . Vitamin D, Ergocalciferol, (DRISDOL) 1.25 MG (50000 UT) CAPS capsule Take 1,250 capsules by mouth once a week.    . Blood Pressure Monitoring (BLOOD PRESSURE MONITOR DELUXE) KIT 1 kit by Does not apply route 3 (three) times daily. 1 kit 0  . docusate sodium (ENEMEEZ) 283 MG enema Place 1 enema (283 mg total) rectally daily. (Patient not taking: Reported on 08/11/2020) 1 each 0  . hydrochlorothiazide (HYDRODIURIL) 25 MG tablet Take 1 tablet (25 mg total) by mouth daily. (Patient not taking: Reported on 08/11/2020) 30 tablet 5  . hydrOXYzine (ATARAX/VISTARIL) 50 MG tablet Take 1 tablet (50 mg total) by mouth at bedtime. (Patient not taking: Reported  on 08/11/2020) 30 tablet 3  . losartan (COZAAR) 100 MG tablet Take 1 tablet (100 mg total) by mouth daily. (Patient not taking: Reported on 08/11/2020) 30 tablet 3  . lovastatin (MEVACOR) 20 MG tablet Take 1 tablet (20 mg total) by mouth at bedtime. (Patient not taking: Reported on 08/11/2020) 30 tablet 3  . metFORMIN (GLUCOPHAGE-XR) 750 MG 24 hr tablet Take 1 tablet (750 mg total) by mouth daily with breakfast. (Patient not taking: Reported on 08/11/2020) 30 tablet 3  . polyethylene glycol (MIRALAX / GLYCOLAX) packet Take 17 g by mouth daily. (Patient not taking: Reported on 08/11/2020) 14 each 0  . topiramate (TOPAMAX) 50 MG tablet Take 1 tablet (50 mg total) by mouth every evening.  (Patient not taking: Reported on 08/11/2020) 10 tablet 2   No Known Allergies Active Problems:   * No active hospital problems. *  Vitals: There were no vitals taken for this visit. Lab results:No results found for this or any previous visit (from the past 67 hour(s)). Radiology Results: No results found. General appearance: alert, cooperative, no distress and morbidly obese Head: Normocephalic, without obvious abnormality, atraumatic Eyes: negative Nose: Nares normal. Septum midline. Mucosa normal. No drainage or sinus tenderness. Throat: lips, mucosa, and tongue normal; teeth and gums normal and multiple carious teeth. No purulence, edema, trismus. Pharynx clear. Neck: no adenopathy and supple, symmetrical, trachea midline Resp: clear to auscultation bilaterally Cardio: regular rate and rhythm, S1, S2 normal, no murmur, click, rub or gallop  Assessment: Multiple non-restorable teeth secondary to dental caries.  Plan: Multiple dental extractions with alveoloplasty. GA, Day surgery.   Diona Browner 08/12/2020

## 2020-08-13 ENCOUNTER — Encounter (HOSPITAL_COMMUNITY): Payer: Self-pay | Admitting: Oral Surgery

## 2020-08-13 NOTE — Anesthesia Preprocedure Evaluation (Addendum)
Anesthesia Evaluation  Patient identified by MRN, date of birth, ID band Patient awake    Reviewed: Allergy & Precautions, NPO status , Patient's Chart, lab work & pertinent test results, reviewed documented beta blocker date and time   History of Anesthesia Complications (+) PONV and history of anesthetic complications  Airway Mallampati: II  TM Distance: >3 FB Neck ROM: Full    Dental  (+) Dental Advisory Given, Poor Dentition, Chipped   Pulmonary sleep apnea and Continuous Positive Airway Pressure Ventilation , COPD,  COPD inhaler, Current Smoker and Patient abstained from smoking.,    Pulmonary exam normal        Cardiovascular hypertension, Pt. on medications and Pt. on home beta blockers Normal cardiovascular exam   '19 TTE - mild LVH. EF 65% to 70%. Grade 1 diastolic  dysfunction. Trivial MR. Mildly dilated RA. Trivial TR.     Neuro/Psych PSYCHIATRIC DISORDERS Anxiety Depression negative neurological ROS     GI/Hepatic negative GI ROS, Neg liver ROS,   Endo/Other  diabetes, Type 2, Oral Hypoglycemic Agents Obesity   Renal/GU negative Renal ROS     Musculoskeletal negative musculoskeletal ROS (+)   Abdominal   Peds  Hematology negative hematology ROS (+)   Anesthesia Other Findings Covid test negative See PAT note  Reproductive/Obstetrics                           Anesthesia Physical Anesthesia Plan  ASA: III  Anesthesia Plan: General   Post-op Pain Management:    Induction: Intravenous  PONV Risk Score and Plan: 3 and Treatment may vary due to age or medical condition, Ondansetron and Midazolam  Airway Management Planned: Nasal ETT  Additional Equipment: None  Intra-op Plan:   Post-operative Plan: Extubation in OR  Informed Consent: I have reviewed the patients History and Physical, chart, labs and discussed the procedure including the risks, benefits and  alternatives for the proposed anesthesia with the patient or authorized representative who has indicated his/her understanding and acceptance.     Dental advisory given  Plan Discussed with: CRNA and Anesthesiologist  Anesthesia Plan Comments:       Anesthesia Quick Evaluation

## 2020-08-13 NOTE — Progress Notes (Signed)
Anesthesia Chart Review: Thomas Medina   Case: [id number] Date/Time: 08/14/20 0715   Procedure: DENTAL RESTORATION/EXTRACTIONS (Bilateral )   Anesthesia type: General   Pre-op diagnosis: NONRESTORABLE   Location: [facility] OR ROOM 04 / [place] [facility]   Surgeons: Diona Browner, DMD      DISCUSSION: Patient is a 64 year old male scheduled for the above procedure.   History includes smoking, post-operative N/V, HTN, DM2, COPD, HLD, PTSD, OSA (BiPAP). He was evaluated by endocrinologist Dr. Dwyane Dee in 08/2019 for subclinical hyperthyroidism with repeat thyroid panel on 08/26/19 being within normal limits. He was seen by general surgeon Orlan Leavens, MD for diastases recti on 05/13/11--weight loss and exercise recommended at that time with no indication for surgical intervention.  He was last evaluated by cardiologist Dr. Harrell Gave on 10/07/19 for HTN and LE edema follow-up. Edema much improved since prior. Previous echo with normal systolic function, grade 1 diastolic dysfunction.  Avoid salt and continue compression stockings and leg elevation when not in use recommended. EKG NSR. No new testing ordered at that time. He has not scheduled follow-up.    Last Belbuca was 2 days ago. Last ASA and phentermine was 08/13/20 AM and will hold until after procedure.  08/12/2020 preprocedure COVID-19 test negative. He is a same day work-up, so anesthesia team to evaluate on the day of procedure.   VS:  BP Readings from Last 3 Encounters:  10/07/19 (!) 166/68  08/26/19 (!) 150/70  05/14/19 122/67   Pulse Readings from Last 3 Encounters:  10/07/19 94  08/26/19 (!) 104  05/14/19 85    PROVIDERS: Simona Huh, NP is PCP  Buford Dresser, MD is cardiologist Elayne Snare, MD is endocrinologist    LABS: For day of procedure. He reported A1c of 5.5% this week with fasting CBGs ~ 114..    EKG: 10/07/19: NSR   CV: Echo 04/12/18: Study Conclusions  - Left ventricle: The cavity size was normal. Wall  thickness was  increased in a pattern of mild LVH. Systolic function was  vigorous. The estimated ejection fraction was in the range of 65%  to 70%. Wall motion was normal; there were no regional wall  motion abnormalities. Doppler parameters are consistent with  abnormal left ventricular relaxation (grade 1 diastolic  dysfunction). The E/e&' ratio is >15, suggesting elevated LV  filling pressure.  - Mitral valve: Mildly thickened leaflets . There was trivial  regurgitation.  - Left atrium: The atrium was normal in size.  - Right atrium: The atrium was mildly dilated.  - Tricuspid valve: There was trivial regurgitation.  - Pulmonary arteries: PA peak pressure: 12 mm Hg (S).  - Inferior vena cava: The vessel was normal in size. The  respirophasic diameter changes were in the normal range (= 50%),  consistent with normal central venous pressure.  Impressions:  - LVEF 65-70%, mild LVH, normal wall motion, grade 1 DD, elevated  LV filling pressure, trivial MR, normal LA size, mild RAE,  trivial TR, RVSP 12 mmHg, normal IVC.    Past Medical History:  Diagnosis Date  . Bursitis    both shoulders and lower back  . COPD (chronic obstructive pulmonary disease) ([place])   . Depression   . Diabetes mellitus without complication ([place])   . Diverticulitis   . Hyperlipidemia   . Hypertension   . Pneumonia   . PONV (postoperative nausea and vomiting)    with nasal polypectomy, no problems when he had colonoscopy  . PTSD (post-traumatic stress disorder)   .  Sleep apnea    uses a bipap  . Vision abnormalities     Past Surgical History:  Procedure Laterality Date  . COLONOSCOPY    . POLYPECTOMY     from nose    MEDICATIONS: No current facility-administered medications for this encounter.   Marland Kitchen aspirin EC 81 MG tablet  . BELBUCA 600 MCG FILM  . carvedilol (COREG) 6.25 MG tablet  . diltiazem (CARDIZEM CD) 240 MG 24 hr capsule  . DULoxetine (CYMBALTA) 20 MG  capsule  . fluticasone (FLONASE) 50 MCG/ACT nasal spray  . ibuprofen (ADVIL) 600 MG tablet  . metoprolol succinate (TOPROL-XL) 100 MG 24 hr tablet  . oxyCODONE (OXY IR/ROXICODONE) 5 MG immediate release tablet  . phentermine (ADIPEX-P) 37.5 MG tablet  . pravastatin (PRAVACHOL) 40 MG tablet  . PROAIR HFA 108 (90 Base) MCG/ACT inhaler  . Semaglutide, 1 MG/DOSE, (OZEMPIC, 1 MG/DOSE,) 4 MG/3ML SOPN  . sildenafil (VIAGRA) 100 MG tablet  . SYMBICORT 160-4.5 MCG/ACT inhaler  . traZODone (DESYREL) 100 MG tablet  . triamterene-hydrochlorothiazide (MAXZIDE) 75-50 MG tablet  . Vitamin D, Ergocalciferol, (DRISDOL) 1.25 MG (50000 UT) CAPS capsule  . Blood Pressure Monitoring (BLOOD PRESSURE MONITOR DELUXE) KIT  . docusate sodium (ENEMEEZ) 283 MG enema  . hydrochlorothiazide (HYDRODIURIL) 25 MG tablet  . hydrOXYzine (ATARAX/VISTARIL) 50 MG tablet  . losartan (COZAAR) 100 MG tablet  . lovastatin (MEVACOR) 20 MG tablet  . metFORMIN (GLUCOPHAGE-XR) 750 MG 24 hr tablet  . polyethylene glycol (MIRALAX / GLYCOLAX) packet  . topiramate (TOPAMAX) 50 MG tablet   By medication list, he is not taking MiraLAX, docusate sodium, lovastatin, hydroxyzine, Topamax, metformin, losartan, HCTZ.   Myra Gianotti, PA-C Surgical Short Stay/Anesthesiology [facility] Phone [phone] [facility] Phone [phone] 08/13/2020 1:45 PM

## 2020-08-13 NOTE — Progress Notes (Signed)
Spoke with pt for pre-op call. Pt has hx of HTN and Type 2 Diabetes Hx. Pt sees Dr. Cristal Deer for his BP, denies cardiac history. Pt states his last A1C was this week and it was 5.5. Pt states his fasting blood sugar is usually around 114. Pt is only on Ozempic once a week (not Friday).  Pt states he was not instructed to hold his Aspirin, he states he will not take it in the AM. He states he has no had his Belbuca for 2 days. Pt's last dose of Phentermine was this morning at 6 AM. Instructed pt not to take it in the AM. He voiced understanding.  Covid test done 08/12/20 and it's negative.  Pt states he's been in quarantine since the test was done and understands that he stays in quarantine until he comes to the hospital tomorrow.

## 2020-08-14 ENCOUNTER — Other Ambulatory Visit: Payer: Self-pay

## 2020-08-14 ENCOUNTER — Ambulatory Visit (HOSPITAL_COMMUNITY): Payer: Medicaid Other | Admitting: Vascular Surgery

## 2020-08-14 ENCOUNTER — Encounter (HOSPITAL_COMMUNITY): Payer: Self-pay | Admitting: Oral Surgery

## 2020-08-14 ENCOUNTER — Encounter (HOSPITAL_COMMUNITY)
Admission: RE | Disposition: A | Discharge status: Home / Self Care | Payer: Self-pay | Source: Ambulatory Visit | Attending: Oral Surgery

## 2020-08-14 ENCOUNTER — Ambulatory Visit (HOSPITAL_COMMUNITY)
Admission: RE | Admit: 2020-08-14 | Discharge: 2020-08-14 | Disposition: A | Discharge status: Home / Self Care | Payer: Medicaid Other | Source: Ambulatory Visit | Attending: Oral Surgery | Admitting: Oral Surgery

## 2020-08-14 DIAGNOSIS — X58XXXA Exposure to other specified factors, initial encounter: Secondary | ICD-10-CM | POA: Diagnosis not present

## 2020-08-14 DIAGNOSIS — K0530 Chronic periodontitis, unspecified: Secondary | ICD-10-CM | POA: Insufficient documentation

## 2020-08-14 DIAGNOSIS — Z79899 Other long term (current) drug therapy: Secondary | ICD-10-CM | POA: Insufficient documentation

## 2020-08-14 DIAGNOSIS — Z7982 Long term (current) use of aspirin: Secondary | ICD-10-CM | POA: Diagnosis not present

## 2020-08-14 DIAGNOSIS — K0889 Other specified disorders of teeth and supporting structures: Secondary | ICD-10-CM | POA: Insufficient documentation

## 2020-08-14 DIAGNOSIS — S025XXA Fracture of tooth (traumatic), initial encounter for closed fracture: Secondary | ICD-10-CM | POA: Insufficient documentation

## 2020-08-14 DIAGNOSIS — K029 Dental caries, unspecified: Secondary | ICD-10-CM | POA: Diagnosis present

## 2020-08-14 HISTORY — DX: Other specified postprocedural states: Z98.890

## 2020-08-14 HISTORY — DX: Diverticulitis of intestine, part unspecified, without perforation or abscess without bleeding: K57.92

## 2020-08-14 HISTORY — PX: TOOTH EXTRACTION: SHX859

## 2020-08-14 HISTORY — DX: Pneumonia, unspecified organism: J18.9

## 2020-08-14 HISTORY — DX: Nausea with vomiting, unspecified: R11.2

## 2020-08-14 HISTORY — DX: Bursopathy, unspecified: M71.9

## 2020-08-14 LAB — CBC
HCT: 43.7 % (ref 39.0–52.0)
Hemoglobin: 14.6 g/dL (ref 13.0–17.0)
MCH: 31.2 pg (ref 26.0–34.0)
MCHC: 33.4 g/dL (ref 30.0–36.0)
MCV: 93.4 fL (ref 80.0–100.0)
Platelets: 252 10*3/uL (ref 150–400)
RBC: 4.68 MIL/uL (ref 4.22–5.81)
RDW: 15.2 % (ref 11.5–15.5)
WBC: 11.2 10*3/uL — ABNORMAL HIGH (ref 4.0–10.5)
nRBC: 0 % (ref 0.0–0.2)

## 2020-08-14 LAB — BASIC METABOLIC PANEL
Anion gap: 12 (ref 5–15)
BUN: 19 mg/dL (ref 8–23)
CO2: 25 mmol/L (ref 22–32)
Calcium: 9.5 mg/dL (ref 8.9–10.3)
Chloride: 100 mmol/L (ref 98–111)
Creatinine, Ser: 0.98 mg/dL (ref 0.61–1.24)
GFR, Estimated: >60 mL/min (ref >60)
Glucose, Bld: 104 mg/dL — ABNORMAL HIGH (ref 70–99)
Potassium: 4.2 mmol/L (ref 3.5–5.1)
Sodium: 137 mmol/L (ref 135–145)

## 2020-08-14 LAB — GLUCOSE, CAPILLARY
Glucose-Capillary: 113 mg/dL — ABNORMAL HIGH (ref 70–99)
Glucose-Capillary: 165 mg/dL — ABNORMAL HIGH (ref 70–99)

## 2020-08-14 SURGERY — DENTAL RESTORATION/EXTRACTIONS
Anesthesia: General | Site: Mouth | Laterality: Bilateral

## 2020-08-14 MED ORDER — OXYCODONE HCL 5 MG PO TABS: 5.0000 mg | ORAL_TABLET | Freq: Once | ORAL | Status: DC | PRN

## 2020-08-14 MED ORDER — LIDOCAINE HCL (PF) 2 % IJ SOLN
INTRAMUSCULAR | Status: AC
  Filled 2020-08-14: qty 5

## 2020-08-14 MED ORDER — SODIUM CHLORIDE 0.9 % IR SOLN
Status: DC | PRN
  Administered 2020-08-14: 1000 mL

## 2020-08-14 MED ORDER — ONDANSETRON HCL 4 MG/2ML IJ SOLN
INTRAMUSCULAR | Status: DC | PRN
  Administered 2020-08-14: 4 mg via INTRAVENOUS

## 2020-08-14 MED ORDER — ROCURONIUM BROMIDE 10 MG/ML (PF) SYRINGE
PREFILLED_SYRINGE | INTRAVENOUS | Status: AC
  Filled 2020-08-14: qty 10

## 2020-08-14 MED ORDER — ACETAMINOPHEN 10 MG/ML IV SOLN
INTRAVENOUS | Status: AC
  Filled 2020-08-14: qty 100

## 2020-08-14 MED ORDER — CHLORHEXIDINE GLUCONATE 0.12 % MT SOLN
15.0000 mL | Freq: Once | OROMUCOSAL | Status: AC
  Administered 2020-08-14: 15 mL via OROMUCOSAL
  Filled 2020-08-14: qty 15

## 2020-08-14 MED ORDER — FENTANYL CITRATE (PF) 250 MCG/5ML IJ SOLN
INTRAMUSCULAR | Status: AC
  Filled 2020-08-14: qty 5

## 2020-08-14 MED ORDER — ONDANSETRON HCL 4 MG/2ML IJ SOLN
INTRAMUSCULAR | Status: AC
  Filled 2020-08-14: qty 2

## 2020-08-14 MED ORDER — FENTANYL CITRATE (PF) 100 MCG/2ML IJ SOLN: 25.0000 ug | INTRAMUSCULAR | Status: DC | PRN

## 2020-08-14 MED ORDER — LIDOCAINE-EPINEPHRINE 2 %-1:100000 IJ SOLN
INTRAMUSCULAR | Status: DC | PRN
  Administered 2020-08-14: 8 mL via INTRADERMAL
  Administered 2020-08-14: 12 mL via INTRADERMAL

## 2020-08-14 MED ORDER — OXYMETAZOLINE HCL 0.05 % NA SOLN
NASAL | Status: AC
  Filled 2020-08-14: qty 30

## 2020-08-14 MED ORDER — 0.9 % SODIUM CHLORIDE (POUR BTL) OPTIME
TOPICAL | Status: DC | PRN
  Administered 2020-08-14: 1000 mL

## 2020-08-14 MED ORDER — ONDANSETRON HCL 4 MG/2ML IJ SOLN: 4.0000 mg | Freq: Once | INTRAMUSCULAR | Status: DC | PRN

## 2020-08-14 MED ORDER — FENTANYL CITRATE (PF) 250 MCG/5ML IJ SOLN
INTRAMUSCULAR | Status: DC | PRN
  Administered 2020-08-14: 50 ug via INTRAVENOUS

## 2020-08-14 MED ORDER — LIDOCAINE 2% (20 MG/ML) 5 ML SYRINGE
INTRAMUSCULAR | Status: DC | PRN
  Administered 2020-08-14: 60 mg via INTRAVENOUS

## 2020-08-14 MED ORDER — ORAL CARE MOUTH RINSE: 15.0000 mL | Freq: Once | OROMUCOSAL | Status: AC

## 2020-08-14 MED ORDER — PROPOFOL 10 MG/ML IV BOLUS
INTRAVENOUS | Status: AC
  Filled 2020-08-14: qty 20

## 2020-08-14 MED ORDER — DEXAMETHASONE SODIUM PHOSPHATE 10 MG/ML IJ SOLN
INTRAMUSCULAR | Status: DC | PRN
  Administered 2020-08-14: 10 mg via INTRAVENOUS

## 2020-08-14 MED ORDER — LACTATED RINGERS IV SOLN: INTRAVENOUS | Status: DC

## 2020-08-14 MED ORDER — SUGAMMADEX SODIUM 200 MG/2ML IV SOLN
INTRAVENOUS | Status: DC | PRN
  Administered 2020-08-14: 100 mg via INTRAVENOUS
  Administered 2020-08-14: 200 mg via INTRAVENOUS

## 2020-08-14 MED ORDER — DEXAMETHASONE SODIUM PHOSPHATE 10 MG/ML IJ SOLN
INTRAMUSCULAR | Status: AC
  Filled 2020-08-14: qty 1

## 2020-08-14 MED ORDER — OXYCODONE HCL 5 MG/5ML PO SOLN: 5.0000 mg | Freq: Once | ORAL | Status: DC | PRN

## 2020-08-14 MED ORDER — PROPOFOL 10 MG/ML IV BOLUS
INTRAVENOUS | Status: DC | PRN
  Administered 2020-08-14: 150 mg via INTRAVENOUS

## 2020-08-14 MED ORDER — MIDAZOLAM HCL 2 MG/2ML IJ SOLN
INTRAMUSCULAR | Status: AC
  Filled 2020-08-14: qty 2

## 2020-08-14 MED ORDER — PHENYLEPHRINE 40 MCG/ML (10ML) SYRINGE FOR IV PUSH (FOR BLOOD PRESSURE SUPPORT)
PREFILLED_SYRINGE | INTRAVENOUS | Status: AC
  Filled 2020-08-14: qty 10

## 2020-08-14 MED ORDER — ROCURONIUM BROMIDE 10 MG/ML (PF) SYRINGE
PREFILLED_SYRINGE | INTRAVENOUS | Status: DC | PRN
  Administered 2020-08-14: 50 mg via INTRAVENOUS

## 2020-08-14 MED ORDER — AMOXICILLIN 500 MG PO CAPS: 500.0000 mg | ORAL_CAPSULE | Freq: Three times a day (TID) | ORAL | 0 refills | Status: DC

## 2020-08-14 MED ORDER — IBUPROFEN 800 MG PO TABS: 800.0000 mg | ORAL_TABLET | Freq: Three times a day (TID) | ORAL | 0 refills | Status: DC | PRN

## 2020-08-14 MED ORDER — OXYMETAZOLINE HCL 0.05 % NA SOLN
NASAL | Status: DC | PRN
  Administered 2020-08-14 (×3): 2 via NASAL

## 2020-08-14 MED ORDER — PHENYLEPHRINE HCL (PRESSORS) 10 MG/ML IV SOLN
INTRAVENOUS | Status: DC | PRN
  Administered 2020-08-14: 120 ug via INTRAVENOUS
  Administered 2020-08-14: 40 ug via INTRAVENOUS
  Administered 2020-08-14: 120 ug via INTRAVENOUS
  Administered 2020-08-14 (×4): 80 ug via INTRAVENOUS

## 2020-08-14 MED ORDER — CEFAZOLIN SODIUM-DEXTROSE 2-4 GM/100ML-% IV SOLN
2.0000 g | INTRAVENOUS | Status: AC
  Administered 2020-08-14: 2 g via INTRAVENOUS
  Filled 2020-08-14: qty 100

## 2020-08-14 MED ORDER — NICOTINE 21 MG/24HR TD PT24: 21.0000 mg | MEDICATED_PATCH | Freq: Every day | TRANSDERMAL | 0 refills | Status: AC

## 2020-08-14 MED ORDER — SCOPOLAMINE 1 MG/3DAYS TD PT72
MEDICATED_PATCH | TRANSDERMAL | Status: DC | PRN
  Administered 2020-08-14: 1 via TRANSDERMAL

## 2020-08-14 MED ORDER — ACETAMINOPHEN 10 MG/ML IV SOLN
INTRAVENOUS | Status: DC | PRN
  Administered 2020-08-14: 1000 mg via INTRAVENOUS

## 2020-08-14 MED ORDER — MIDAZOLAM HCL 2 MG/2ML IJ SOLN
INTRAMUSCULAR | Status: DC | PRN
  Administered 2020-08-14: 2 mg via INTRAVENOUS

## 2020-08-14 MED ORDER — LIDOCAINE-EPINEPHRINE 2 %-1:100000 IJ SOLN
INTRAMUSCULAR | Status: AC
  Filled 2020-08-14: qty 1

## 2020-08-14 SURGICAL SUPPLY — 40 items
BLADE SURG 15 STRL LF DISP TIS (BLADE) ×1 IMPLANT
BLADE SURG 15 STRL SS (BLADE) ×2
BUR CROSS CUT FISSURE 1.6 (BURR) ×2 IMPLANT
BUR CROSS CUT FISSURE 1.6MM (BURR) ×1
BUR EGG ELITE 4.0 (BURR) ×2 IMPLANT
BUR EGG ELITE 4.0MM (BURR) ×1
CANISTER SUCT 3000ML PPV (MISCELLANEOUS) ×3 IMPLANT
COVER SURGICAL LIGHT HANDLE (MISCELLANEOUS) ×3 IMPLANT
COVER WAND RF STERILE (DRAPES) IMPLANT
DECANTER SPIKE VIAL GLASS SM (MISCELLANEOUS) ×3 IMPLANT
DRAPE U-SHAPE 76X120 STRL (DRAPES) ×3 IMPLANT
GAUZE PACKING FOLDED 2  STR (GAUZE/BANDAGES/DRESSINGS) ×2
GAUZE PACKING FOLDED 2 STR (GAUZE/BANDAGES/DRESSINGS) ×1 IMPLANT
GLOVE BIO SURGEON STRL SZ 6.5 (GLOVE) IMPLANT
GLOVE BIO SURGEON STRL SZ7 (GLOVE) IMPLANT
GLOVE BIO SURGEON STRL SZ8 (GLOVE) ×3 IMPLANT
GLOVE BIO SURGEONS STRL SZ 6.5 (GLOVE)
GLOVE BIOGEL PI IND STRL 6.5 (GLOVE) IMPLANT
GLOVE BIOGEL PI IND STRL 7.0 (GLOVE) IMPLANT
GLOVE BIOGEL PI INDICATOR 6.5 (GLOVE)
GLOVE BIOGEL PI INDICATOR 7.0 (GLOVE)
GOWN STRL REUS W/ TWL LRG LVL3 (GOWN DISPOSABLE) ×1 IMPLANT
GOWN STRL REUS W/ TWL XL LVL3 (GOWN DISPOSABLE) ×1 IMPLANT
GOWN STRL REUS W/TWL LRG LVL3 (GOWN DISPOSABLE) ×2
GOWN STRL REUS W/TWL XL LVL3 (GOWN DISPOSABLE) ×2
IV NS 1000ML (IV SOLUTION) ×2
IV NS 1000ML BAXH (IV SOLUTION) ×1 IMPLANT
KIT BASIN OR (CUSTOM PROCEDURE TRAY) ×3 IMPLANT
KIT TURNOVER KIT B (KITS) ×3 IMPLANT
NDL HYPO 25GX1X1/2 BEV (NEEDLE) ×2 IMPLANT
NEEDLE HYPO 25GX1X1/2 BEV (NEEDLE) ×6 IMPLANT
NS IRRIG 1000ML POUR BTL (IV SOLUTION) ×3 IMPLANT
PAD ARMBOARD 7.5X6 YLW CONV (MISCELLANEOUS) ×3 IMPLANT
SLEEVE IRRIGATION ELITE 7 (MISCELLANEOUS) ×3 IMPLANT
SPONGE SURGIFOAM ABS GEL 12-7 (HEMOSTASIS) IMPLANT
SUT CHROMIC 3 0 PS 2 (SUTURE) ×3 IMPLANT
SYR CONTROL 10ML LL (SYRINGE) ×3 IMPLANT
TRAY ENT MC OR (CUSTOM PROCEDURE TRAY) ×3 IMPLANT
TUBING IRRIGATION (MISCELLANEOUS) ×3 IMPLANT
YANKAUER SUCT BULB TIP NO VENT (SUCTIONS) ×3 IMPLANT

## 2020-08-14 NOTE — H&P (Signed)
H&P documentation  -History and Physical Reviewed  -Patient has been re-examined  -No change in the plan of care  Thomas Medina  

## 2020-08-14 NOTE — Addendum Note (Signed)
Addendum  created 08/14/20 1042 by Shary Decamp, CRNA   Clinical Note Signed, Intraprocedure Blocks edited

## 2020-08-14 NOTE — Op Note (Signed)
NAMENYSHAWN, GOWDY MEDICAL RECORD [id number] ACCOUNT [id number] DATE OF BIRTH:[birth year] FACILITY: [facility] LOCATION: [facility] PHYSICIAN:Arthuro Canelo M. 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  PROCEDURE:  Extraction of teeth number 3, 6, 8, 9, 10, 11, 14, 16, 18, 20, 30, 31; alveoplasty right maxilla, left maxilla.  SURGEON:  Ocie Doyne,  DDS  ANESTHESIA:  General, nasal intubation.  Dr. Mal Amabile attending.  DESCRIPTION OF PROCEDURE:  The patient was taken to the operating room and placed on the table in supine position.  General anesthesia was administered.  Attempts were made for nasal intubation, but were unsuccessful.  An oral endotracheal tube was  placed and secured.  The eyes were protected, and the patient was draped for surgery.  A timeout was performed.  The posterior pharynx was suctioned, and a throat pack was placed.  2% lidocaine with 1:100,000 epinephrine was infiltrated in an inferior  alveolar block on the right and left sides and in buccal and palatal infiltration around the maxillary teeth to be removed.  A bite block was placed on the right side of the mouth.  The left side was operated first.  A 15 blade used to make an incision  around teeth numbers 18 and 20.  The periosteum was reflected from around these teeth.  Tooth number 18 was elevated, but could not be removed with forceps, so the tooth was sectioned and then removed with the rongeurs.  Tooth number 20 was removed with  the Ash forceps.  The sockets were then curetted, irrigated.  No closure was necessary.  In the left maxilla, a 15 blade was used to make an incision around teeth numbers 16 and 14 and carried  forward along the alveolar crest to teeth numbers 11, 10, 9,  and 8.  The periosteum was reflected from around these teeth.  Teeth numbers 16, 10, 9, and 8 were removed with the dental forceps after elevating with 301 elevator.  Teeth numbers 14 and 11 required removal of circumferential bone and tooth number 14  required sectioning.  The teeth were then removed, and the sockets were curetted.  The periosteum was reflected to expose the alveolar ridge, which was irregular in contour owing to the periodontal disease that was preexistent.  Then, the alveoplasty was  performed using the egg bur followed by the bone file.  Then, the area was irrigated and closed with 3-0 chromic.  The bite block was removed as well as the throat pack, and the endotracheal tube was repositioned and secured to the other side of the  mouth.  The throat pack was repositioned, and then 15 blade was used to make an incision around teeth numbers 30 and 31 in the mandible and around teeth numbers 3 and 6 in the maxilla.  The periosteum was reflected from around these teeth.  They were  elevated with a 301 elevator.  Tooth number 31 was removed with the dental forceps as well as teeth numbers 3 and 6.  However, tooth number 30 could not be removed with a forceps, so the tooth was sectioned and the roots were removed independently.  Bone  was removed around the mesial buccal root and then the root was  removed with 301 elevator.  Then, the sockets were curetted.  The right mandible was closed with 3-0 chromic.  No sutures were necessary in the maxilla, and the oral cavity was irrigated  and suctioned and throat pack was removed.  The patient was left in care of anesthesia for extubation and transport to recovery room with plans for discharge home through day surgery.  ESTIMATED BLOOD LOSS:  Minimum.  COMPLICATIONS:  None.  SPECIMENS:  None.  IN/NUANCE  D:08/14/2020 T:08/14/2020 JOB:[id number]

## 2020-08-14 NOTE — Addendum Note (Signed)
Addendum  created 08/14/20 1056 by Shary Decamp, CRNA   Clinical Note Signed, Intraprocedure Blocks edited

## 2020-08-14 NOTE — Progress Notes (Signed)
Patient is in phase 2 and meets all criteria to be discharged home. Attempted to call daughter and no answer. Will continue to reach out to her for discharge instructions.

## 2020-08-14 NOTE — Progress Notes (Signed)
Assessment completed by C. Darrin Luis, RN

## 2020-08-14 NOTE — Transfer of Care (Signed)
Immediate Anesthesia Transfer of Care Note  Patient: Thomas Medina  Procedure(s) Performed: DENTAL RESTORATION/EXTRACTIONS (Bilateral Mouth)  Patient Location: PACU  Anesthesia Type:General  Level of Consciousness: drowsy, patient cooperative and responds to stimulation  Airway & Oxygen Therapy: Patient Spontanous Breathing and Patient connected to face mask oxygen  Post-op Assessment: Report given to RN, Post -op Vital signs reviewed and stable and Patient moving all extremities X 4  Post vital signs: Reviewed and stable  Last Vitals:  Vitals Value Taken Time  BP 108/64 08/14/20 0924  Temp    Pulse 78 08/14/20 0925  Resp 31 08/14/20 0925  SpO2 94 % 08/14/20 0925  Vitals shown include unvalidated device data.  Last Pain:  Vitals:   08/14/20 0648  TempSrc:   PainSc: 6       Patients Stated Pain Goal: 5 (08/14/20 2585)  Complications: No complications documented.

## 2020-08-14 NOTE — Op Note (Signed)
08/14/2020  8:58 AM  PATIENT:  Thomas Medina  64 y.o. male  PRE-OPERATIVE DIAGNOSIS:  NONRESTORABLE TEETH # 3, 6, 8, 9, 10 11, 14, 16, 18, 20, 30, 31 POST-OPERATIVE DIAGNOSIS:  SAME  PROCEDURE:  Procedure(s): EXTRACTION TEETH # 3, 6, 8, 9, 10 11, 14, 16, 18, 20, 30, 31, alveoloplasty upper right, upper left   SURGEON:  Surgeon(s): Ocie Doyne, DMD  ANESTHESIA:   local and general  EBL:  minimal  DRAINS: none   SPECIMEN:  No Specimen  COUNTS:  YES  PLAN OF CARE: Discharge to home after PACU  PATIENT DISPOSITION:  PACU - hemodynamically stable.   PROCEDURE DETAILS: Dictation # [id number]  Thomas Medina, DMD 08/14/2020 8:58 AM

## 2020-08-14 NOTE — Anesthesia Procedure Notes (Addendum)
Procedure Name: Intubation Date/Time: 08/14/2020 7:54 AM Performed by: Michele Rockers, CRNA Pre-anesthesia Checklist: Patient identified, Patient being monitored, Timeout performed, Emergency Drugs available and Suction available Patient Re-evaluated:Patient Re-evaluated prior to induction Oxygen Delivery Method: Circle System Utilized Preoxygenation: Pre-oxygenation with 100% oxygen Induction Type: IV induction Ventilation: Two handed mask ventilation required and Oral airway inserted - appropriate to patient size Laryngoscope Size: Mac, 3 and Glidescope Grade View: Grade II Tube type: Oral Tube size: 8.0 mm Number of attempts: 1 Airway Equipment and Method: Stylet and Video-laryngoscopy Placement Confirmation: ETT inserted through vocal cords under direct vision,  positive ETCO2 and breath sounds checked- equal and bilateral Secured at: 23 cm Tube secured with: Tape Dental Injury: Teeth and Oropharynx as per pre-operative assessment  Difficulty Due To: Difficulty was unanticipated Future Recommendations: Recommend- induction with short-acting agent, and alternative techniques readily available

## 2020-08-14 NOTE — Anesthesia Postprocedure Evaluation (Signed)
Anesthesia Post Note  Patient: Thomas Medina  Procedure(s) Performed: DENTAL RESTORATION/EXTRACTIONS (Bilateral Mouth)     Patient location during evaluation: PACU Anesthesia Type: General Level of consciousness: awake and alert Pain management: pain level controlled Vital Signs Assessment: post-procedure vital signs reviewed and stable Respiratory status: spontaneous breathing, nonlabored ventilation and respiratory function stable Cardiovascular status: blood pressure returned to baseline and stable Postop Assessment: no apparent nausea or vomiting Anesthetic complications: no   No complications documented.  Last Vitals:  Vitals:   08/14/20 0955 08/14/20 1015  BP: 125/67 132/65  Pulse: 77 77  Resp: 17 17  Temp:    SpO2: 96% 92%    Last Pain:  Vitals:   08/14/20 0955  TempSrc:   PainSc: Asleep                 Beryle Lathe

## 2020-08-15 ENCOUNTER — Encounter (HOSPITAL_COMMUNITY): Payer: Self-pay | Admitting: Oral Surgery

## 2021-01-02 ENCOUNTER — Other Ambulatory Visit: Payer: Self-pay | Admitting: Cardiology

## 2021-01-02 DIAGNOSIS — I10 Essential (primary) hypertension: Secondary | ICD-10-CM

## 2021-02-05 ENCOUNTER — Emergency Department (HOSPITAL_BASED_OUTPATIENT_CLINIC_OR_DEPARTMENT_OTHER): Payer: Medicaid Other | Admitting: Radiology

## 2021-02-05 ENCOUNTER — Encounter (HOSPITAL_BASED_OUTPATIENT_CLINIC_OR_DEPARTMENT_OTHER): Payer: Self-pay | Admitting: *Deleted

## 2021-02-05 ENCOUNTER — Other Ambulatory Visit: Payer: Self-pay

## 2021-02-05 ENCOUNTER — Emergency Department (HOSPITAL_BASED_OUTPATIENT_CLINIC_OR_DEPARTMENT_OTHER)
Admission: EM | Admit: 2021-02-05 | Discharge: 2021-02-05 | Disposition: A | Discharge status: Home / Self Care | Payer: Medicaid Other | Attending: Emergency Medicine | Admitting: Emergency Medicine

## 2021-02-05 DIAGNOSIS — Z79899 Other long term (current) drug therapy: Secondary | ICD-10-CM | POA: Insufficient documentation

## 2021-02-05 DIAGNOSIS — J449 Chronic obstructive pulmonary disease, unspecified: Secondary | ICD-10-CM | POA: Diagnosis not present

## 2021-02-05 DIAGNOSIS — Z794 Long term (current) use of insulin: Secondary | ICD-10-CM | POA: Insufficient documentation

## 2021-02-05 DIAGNOSIS — M546 Pain in thoracic spine: Secondary | ICD-10-CM | POA: Diagnosis present

## 2021-02-05 DIAGNOSIS — Y92410 Unspecified street and highway as the place of occurrence of the external cause: Secondary | ICD-10-CM | POA: Insufficient documentation

## 2021-02-05 DIAGNOSIS — E119 Type 2 diabetes mellitus without complications: Secondary | ICD-10-CM | POA: Diagnosis not present

## 2021-02-05 DIAGNOSIS — Z7982 Long term (current) use of aspirin: Secondary | ICD-10-CM | POA: Insufficient documentation

## 2021-02-05 DIAGNOSIS — F17210 Nicotine dependence, cigarettes, uncomplicated: Secondary | ICD-10-CM | POA: Insufficient documentation

## 2021-02-05 DIAGNOSIS — I10 Essential (primary) hypertension: Secondary | ICD-10-CM | POA: Diagnosis not present
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
## 2022-01-18 ENCOUNTER — Telehealth: Payer: Self-pay | Admitting: Physician Assistant

## 2022-01-18 NOTE — Telephone Encounter (Signed)
Scheduled appt per 5/4 referral. Pt is aware of appt date and time. Pt is aware to arrive 15 mins prior to appt time and to bring and updated insurance card. Pt is aware of appt location.   ?

## 2022-01-31 NOTE — Progress Notes (Unsigned)
[facility] [phone]   [phone]  CONSULT NOTE  REFERRING PHYSICIAN: Simona Huh  REASON FOR CONSULTATION:  Thrombocytopenia  HPI Thomas Medina is a 66 y.o. male with a past medical history significant for obesity, bursitis, osteoarthritis, sleep apnea, hypertension, PTSD, anxiety, hypercholesterolemia, and  ***is referred to the clinic for evaluation of thrombocytopenia.  The patient follows with [facility] for pain management of his knee on 01/12/2022.  He has been receiving chronic opioid management for his back dating back to 2020.  He had blood work performed which showed low platelet count at 68,000.  His hemoglobin was within normal limits at 15.4 and his white blood cell count was within normal limits at 8.9.  He also had a PT and PTT performed which was normal at 1.0.  His INR was 10.5 seconds.   For comparison, I have labs from 08/14/2022 which showed a normal CBC and a normal platelet count of 252.  The oldest records I have are from 03/27/2017.  The patient's platelet count appears to have been normal.  To the patient's knowledge this has been going on for approximately ***.    Of note, the patient *** Liver disease.   Cymbalta***   Overall, the patient is feeling fatigued today***.  He denies any frequent infections except *** He denies over-the-counter herbal supplements besides a multivitamin***.  He denies any fever, chills, abdominal pain, early satiety, unexplained weight loss, or night sweats. He denies any history of any autoimmune disorders.  Viral infecx.   Denies any history of bariatric surgery.  Bleeding. Bruising. He denies any other abnormal bleeding including gingival bleeding, hemoptysis, hematemesis, melena, hematochezia, or hematuria.  Denies any abnormal vaginal bleeding.    Prior, family history, hx bleeding pr bruising, new meds including aspirin, herbal, vaccines, NSAIDS, herbasl, any blood thinnsesr,  viral, bacterial, lymes disease, malaria, dengue fever, HIV. Diets, vegan, zing veg,   HPI  Past Medical History:  Diagnosis Date   Bursitis    both shoulders and lower back   COPD (chronic obstructive pulmonary disease) ([facility])    Depression    Diabetes mellitus without complication ([facility])    Diverticulitis    Hyperlipidemia    Hypertension    Pneumonia    PONV (postoperative nausea and vomiting)    with nasal polypectomy, no problems when he had colonoscopy   PTSD (post-traumatic stress disorder)    Sleep apnea    uses a bipap   Vision abnormalities     Past Surgical History:  Procedure Laterality Date   COLONOSCOPY     POLYPECTOMY     from nose   TOOTH EXTRACTION Bilateral 08/14/2020   Procedure: DENTAL RESTORATION/EXTRACTIONS;  Surgeon: Diona Browner, DMD;  Location: [facility];  Service: Oral Surgery;  Laterality: Bilateral;    Family History  Problem Relation Age of Onset   Aneurysm Mother    Heart attack Father    Hypertension Father    Hyperlipidemia Father    Renal cancer Brother    Kidney disease Brother    Diabetes Neg Hx    Thyroid disease Neg Hx     Social History Social History   Tobacco Use   Smoking status: Every Day    Packs/day: 0.25    Types: Cigarettes   Smokeless tobacco: Never   Tobacco comments:    4 cigarettes a day  Substance Use Topics   Alcohol use: Yes    Comment: occ   Drug use: Not  Currently    Comment: none for 20 years    No Known Allergies  Current Outpatient Medications  Medication Sig Dispense Refill   aspirin EC 81 MG tablet Take 81 mg by mouth daily. Swallow whole.     BELBUCA 600 MCG FILM Take 600 mcg by mouth every 12 (twelve) hours as needed (Pain).   0   Blood Pressure Monitoring (BLOOD PRESSURE MONITOR DELUXE) KIT 1 kit by Does not apply route 3 (three) times daily. 1 kit 0   carvedilol (COREG) 6.25 MG tablet Take 1 tablet (6.25 mg total) by mouth 2 (two) times daily. Please schedule appt for future refills. 2nd  attempt 30 tablet 0   diltiazem (CARDIZEM CD) 240 MG 24 hr capsule Take 240 mg by mouth daily.     DULoxetine (CYMBALTA) 20 MG capsule Take 2 capsules (40 mg total) by mouth daily. 60 capsule 3   fluticasone (FLONASE) 50 MCG/ACT nasal spray Place 2 sprays into both nostrils at bedtime.     hydrochlorothiazide (HYDRODIURIL) 25 MG tablet Take 1 tablet (25 mg total) by mouth daily. 30 tablet 5   hydrOXYzine (ATARAX/VISTARIL) 50 MG tablet Take 1 tablet (50 mg total) by mouth at bedtime. 30 tablet 3   ibuprofen (ADVIL) 800 MG tablet Take 1 tablet (800 mg total) by mouth every 8 (eight) hours as needed. 30 tablet 0   losartan (COZAAR) 100 MG tablet Take 1 tablet (100 mg total) by mouth daily. 30 tablet 3   lovastatin (MEVACOR) 20 MG tablet Take 1 tablet (20 mg total) by mouth at bedtime. 30 tablet 3   metoprolol succinate (TOPROL-XL) 100 MG 24 hr tablet Take 100 mg by mouth daily. for high blood pressure     nicotine (NICODERM CQ) 21 mg/24hr patch Place 1 patch (21 mg total) onto the skin daily. 28 patch 0   oxyCODONE (OXY IR/ROXICODONE) 5 MG immediate release tablet Take 10 mg by mouth 3 (three) times daily as needed for moderate pain or severe pain.   0   phentermine (ADIPEX-P) 37.5 MG tablet Take 37.5 mg by mouth daily before breakfast.      polyethylene glycol (MIRALAX / GLYCOLAX) packet Take 17 g by mouth daily. 14 each 0   pravastatin (PRAVACHOL) 40 MG tablet Take 40 mg by mouth daily.     PROAIR HFA 108 (90 Base) MCG/ACT inhaler Inhale 1 puff into the lungs 4 (four) times daily as needed for shortness of breath.     Semaglutide, 1 MG/DOSE, (OZEMPIC, 1 MG/DOSE,) 4 MG/3ML SOPN Inject 1 mL into the skin once a week.     sildenafil (VIAGRA) 100 MG tablet Take 0.5-1 tablets (50-100 mg total) by mouth daily as needed for erectile dysfunction. 5 tablet 2   SYMBICORT 160-4.5 MCG/ACT inhaler Inhale 2 puffs into the lungs daily as needed (Shortnesss of breath).      topiramate (TOPAMAX) 50 MG tablet Take 1  tablet (50 mg total) by mouth every evening. 10 tablet 2   traZODone (DESYREL) 100 MG tablet Take 100 mg by mouth at bedtime as needed for sleep.     Vitamin D, Ergocalciferol, (DRISDOL) 1.25 MG (50000 UT) CAPS capsule Take 1,250 capsules by mouth once a week.     No current facility-administered medications for this visit.    REVIEW OF SYSTEMS:   Review of Systems  Constitutional: Negative for appetite change, chills, fatigue, fever and unexpected weight change.  HENT:   Negative for mouth sores, nosebleeds, sore throat and trouble  swallowing.   Eyes: Negative for eye problems and icterus.  Respiratory: Negative for cough, hemoptysis, shortness of breath and wheezing.   Cardiovascular: Negative for chest pain and leg swelling.  Gastrointestinal: Negative for abdominal pain, constipation, diarrhea, nausea and vomiting.  Genitourinary: Negative for bladder incontinence, difficulty urinating, dysuria, frequency and hematuria.   Musculoskeletal: Negative for back pain, gait problem, neck pain and neck stiffness.  Skin: Negative for itching and rash.  Neurological: Negative for dizziness, extremity weakness, gait problem, headaches, light-headedness and seizures.  Hematological: Negative for adenopathy. Does not bruise/bleed easily.  Psychiatric/Behavioral: Negative for confusion, depression and sleep disturbance. The patient is not nervous/anxious.     PHYSICAL EXAMINATION:  There were no vitals taken for this visit.  ECOG PERFORMANCE STATUS: {CHL ONC ECOG [id number]  Physical Exam  Constitutional: Oriented to person, place, and time and well-developed, well-nourished, and in no distress. No distress.  HENT:  Head: Normocephalic and atraumatic.  Mouth/Throat: Oropharynx is clear and moist. No oropharyngeal exudate.  Eyes: Conjunctivae are normal. Right eye exhibits no discharge. Left eye exhibits no discharge. No scleral icterus.  Neck: Normal range of motion. Neck supple.   Cardiovascular: Normal rate, regular rhythm, normal heart sounds and intact distal pulses.   Pulmonary/Chest: Effort normal and breath sounds normal. No respiratory distress. No wheezes. No rales.  Abdominal: Soft. Bowel sounds are normal. Exhibits no distension and no mass. There is no tenderness.  Musculoskeletal: Normal range of motion. Exhibits no edema.  Lymphadenopathy:    No cervical adenopathy.  Neurological: Alert and oriented to person, place, and time. Exhibits normal muscle tone. Gait normal. Coordination normal.  Skin: Skin is warm and dry. No rash noted. Not diaphoretic. No erythema. No pallor.  Psychiatric: Mood, memory and judgment normal.  Vitals reviewed.  LABORATORY DATA: Lab Results  Component Value Date   WBC 11.2 (H) 08/14/2020   HGB 14.6 08/14/2020   HCT 43.7 08/14/2020   MCV 93.4 08/14/2020   PLT 252 08/14/2020      Chemistry      Component Value Date/Time   NA 137 08/14/2020 0624   NA 140 04/03/2018 1132   K 4.2 08/14/2020 0624   CL 100 08/14/2020 0624   CO2 25 08/14/2020 0624   BUN 19 08/14/2020 0624   BUN 11 04/03/2018 1132   CREATININE 0.98 08/14/2020 0624      Component Value Date/Time   CALCIUM 9.5 08/14/2020 0624   ALKPHOS 97 04/03/2018 1132   AST 31 04/03/2018 1132   ALT 68 (H) 04/03/2018 1132   BILITOT 0.3 04/03/2018 1132       RADIOGRAPHIC STUDIES: No results found.  ASSESSMENT: This is a very pleasant 66 year old African-American male referred to clinic for thrombocytopenia  Patient was seen with Dr. Julien Nordmann today.  The patient several lab studies performed including CBC, CMP, iron studies, ferritin, hepatitis panel, HIV, folate, and B12.    The patient's labs today show thrombocytopenia with a platelet count of ***.  Rest of her CBC is unremarkable.  Her CMP shows ***  The patient's LDH is within normal limits.  Vitamin B12 is within normal limits.  Her other lab studies are pending at this time including the folate, HIV,  hepatitis, ANA, rheumatoid factor.  It is likely that the patient's thrombocytopenia is secondary to ***..  The patient's platelet count generally is above 100,000 and she is not at risk for bleeding complication unless her platelet count is less than 20-30,000.  There is no specific treatment except ***  She does not need to follow-up with our clinic unless she develops worsening or significant bleeding or bruising along with thrombocytopenia.***   ***  The patient voices understanding of current disease status and treatment options and is in agreement with the current care plan.  All questions were answered. The patient knows to call the clinic with any problems, questions or concerns. We can certainly see the patient much sooner if necessary.  Thank you so much for allowing me to participate in the care of Geneva Pallas. I will continue to follow up the patient with you and assist in his care.  I spent {CHL ONC TIME VISIT - GNFAO:[id number]} counseling the patient face to face. The total time spent in the appointment was {CHL ONC TIME VISIT - NGEXB:[id number]}.  Disclaimer: This note was dictated with voice recognition software. Similar sounding words can inadvertently be transcribed and may not be corrected upon review.   [place] Jan 31, 2022, 4:14 PM

## 2022-02-02 ENCOUNTER — Inpatient Hospital Stay: Payer: Medicaid Other

## 2022-02-02 ENCOUNTER — Other Ambulatory Visit: Payer: Self-pay | Admitting: Physician Assistant

## 2022-02-02 ENCOUNTER — Inpatient Hospital Stay: Payer: Medicaid Other | Attending: Physician Assistant | Admitting: Physician Assistant

## 2022-02-02 DIAGNOSIS — D696 Thrombocytopenia, unspecified: Secondary | ICD-10-CM

## 2022-02-08 ENCOUNTER — Telehealth: Payer: Self-pay | Admitting: Hematology and Oncology

## 2022-02-08 NOTE — Telephone Encounter (Signed)
R/s pt's new hem appt. Pt is aware of new appt date and time.  °

## 2022-02-22 ENCOUNTER — Telehealth: Payer: Self-pay | Admitting: Hematology and Oncology

## 2022-02-22 NOTE — Telephone Encounter (Signed)
Per 6/13 reminder call called and left message for pt with details and call back number

## 2022-02-23 ENCOUNTER — Other Ambulatory Visit: Payer: Self-pay

## 2022-02-23 ENCOUNTER — Inpatient Hospital Stay: Payer: Medicaid Other

## 2022-02-23 ENCOUNTER — Inpatient Hospital Stay: Payer: Medicaid Other | Attending: Physician Assistant | Admitting: Hematology and Oncology

## 2022-02-23 VITALS — BP 150/68 | HR 92 | Temp 98.3°F | Resp 17 | Wt 265.1 lb

## 2022-02-23 DIAGNOSIS — E119 Type 2 diabetes mellitus without complications: Secondary | ICD-10-CM | POA: Insufficient documentation

## 2022-02-23 DIAGNOSIS — Z7985 Long-term (current) use of injectable non-insulin antidiabetic drugs: Secondary | ICD-10-CM | POA: Insufficient documentation

## 2022-02-23 DIAGNOSIS — Z79899 Other long term (current) drug therapy: Secondary | ICD-10-CM | POA: Insufficient documentation

## 2022-02-23 DIAGNOSIS — J449 Chronic obstructive pulmonary disease, unspecified: Secondary | ICD-10-CM | POA: Diagnosis not present

## 2022-02-23 DIAGNOSIS — F17210 Nicotine dependence, cigarettes, uncomplicated: Secondary | ICD-10-CM | POA: Insufficient documentation

## 2022-02-23 DIAGNOSIS — G4730 Sleep apnea, unspecified: Secondary | ICD-10-CM | POA: Insufficient documentation

## 2022-02-23 DIAGNOSIS — I10 Essential (primary) hypertension: Secondary | ICD-10-CM | POA: Diagnosis not present

## 2022-02-23 DIAGNOSIS — D696 Thrombocytopenia, unspecified: Secondary | ICD-10-CM | POA: Diagnosis present

## 2022-02-23 DIAGNOSIS — E785 Hyperlipidemia, unspecified: Secondary | ICD-10-CM | POA: Insufficient documentation

## 2022-02-23 LAB — CBC WITH DIFFERENTIAL (CANCER CENTER ONLY)
Abs Immature Granulocytes: 0.04 10*3/uL (ref 0.00–0.07)
Basophils Absolute: 0 10*3/uL (ref 0.0–0.1)
Basophils Relative: 1 %
Eosinophils Absolute: 0.2 10*3/uL (ref 0.0–0.5)
Eosinophils Relative: 3 %
HCT: 45.1 % (ref 39.0–52.0)
Hemoglobin: 15 g/dL (ref 13.0–17.0)
Immature Granulocytes: 1 %
Lymphocytes Relative: 27 %
Lymphs Abs: 2.2 10*3/uL (ref 0.7–4.0)
MCH: 30.2 pg (ref 26.0–34.0)
MCHC: 33.3 g/dL (ref 30.0–36.0)
MCV: 90.9 fL (ref 80.0–100.0)
Monocytes Absolute: 0.9 10*3/uL (ref 0.1–1.0)
Monocytes Relative: 11 %
Neutro Abs: 4.6 10*3/uL (ref 1.7–7.7)
Neutrophils Relative %: 57 %
Platelet Count: UNDETERMINED 10*3/uL (ref 150–400)
RBC: 4.96 MIL/uL (ref 4.22–5.81)
RDW: 16 % — ABNORMAL HIGH (ref 11.5–15.5)
WBC Count: 8 10*3/uL (ref 4.0–10.5)
nRBC: 0 % (ref 0.0–0.2)

## 2022-02-23 LAB — LACTATE DEHYDROGENASE: LDH: 71 U/L — ABNORMAL LOW (ref 98–192)

## 2022-02-23 LAB — CMP (CANCER CENTER ONLY)
ALT: 19 U/L (ref 0–44)
AST: 18 U/L (ref 15–41)
Albumin: 3.8 g/dL (ref 3.5–5.0)
Alkaline Phosphatase: 67 U/L (ref 38–126)
Anion gap: 6 (ref 5–15)
BUN: 21 mg/dL (ref 8–23)
CO2: 25 mmol/L (ref 22–32)
Calcium: 8.9 mg/dL (ref 8.9–10.3)
Chloride: 107 mmol/L (ref 98–111)
Creatinine: 0.9 mg/dL (ref 0.61–1.24)
GFR, Estimated: >60 mL/min (ref >60)
Glucose, Bld: 115 mg/dL — ABNORMAL HIGH (ref 70–99)
Potassium: 4.5 mmol/L (ref 3.5–5.1)
Sodium: 138 mmol/L (ref 135–145)
Total Bilirubin: 0.5 mg/dL (ref 0.3–1.2)
Total Protein: 6.9 g/dL (ref 6.5–8.1)

## 2022-02-23 LAB — FOLATE: Folate: 10.9 ng/mL (ref >5.9)

## 2022-02-23 LAB — HIV ANTIBODY (ROUTINE TESTING W REFLEX): HIV Screen 4th Generation wRfx: NONREACTIVE

## 2022-02-23 LAB — HEPATITIS B SURFACE ANTIGEN: Hepatitis B Surface Ag: NONREACTIVE

## 2022-02-23 LAB — HEPATITIS PANEL, ACUTE
HCV Ab: NONREACTIVE
Hep A IgM: NONREACTIVE
Hep B C IgM: NONREACTIVE
Hepatitis B Surface Ag: NONREACTIVE

## 2022-02-23 LAB — HEPATITIS B SURFACE ANTIBODY,QUALITATIVE: Hep B S Ab: NONREACTIVE

## 2022-02-23 LAB — IMMATURE PLATELET FRACTION: Immature Platelet Fraction: 19.4 % — ABNORMAL HIGH (ref 1.2–8.6)

## 2022-02-23 LAB — VITAMIN B12: Vitamin B-12: 225 pg/mL (ref 180–914)

## 2022-02-23 LAB — HEPATITIS B CORE ANTIBODY, TOTAL: Hep B Core Total Ab: NONREACTIVE

## 2022-02-23 NOTE — Progress Notes (Incomplete)
[facility] [phone]) [phone]   Fax:([phone]) [place] NOTE  Patient Care Team: Simona Huh, NP as PCP - General (Nurse Practitioner) Buford Dresser, MD as PCP - Cardiology (Cardiology)  Hematological/Oncological History # Thrombocytopenia 08/14/2020: WBC 11.2, Hgb 14.6, MCV 93.4, Plt 252 01/13/2022: WBC 8.9, Hgb 15.4, MCV 92, Plt 68 02/23/2022: establish care with Dr. Lorenso Courier   CHIEF COMPLAINTS/PURPOSE OF CONSULTATION:  "Thrombocytopenia "  HISTORY OF PRESENTING ILLNESS:  Thomas Medina 66 y.o. male with medical history significant for sleep apnea, hypertension, hyperlipidemia, diverticulitis, type 2 diabetes, and COPD who presents for evaluation of thrombocytopenia.  On review of the previous records Thomas Medina ***  On exam today Thomas Medina ***  MEDICAL HISTORY:  Past Medical History:  Diagnosis Date   Bursitis    both shoulders and lower back   COPD (chronic obstructive pulmonary disease) ([place])    Depression    Diabetes mellitus without complication ([place])    Diverticulitis    Hyperlipidemia    Hypertension    Pneumonia    PONV (postoperative nausea and vomiting)    with nasal polypectomy, no problems when he had colonoscopy   PTSD (post-traumatic stress disorder)    Sleep apnea    uses a bipap   Vision abnormalities     SURGICAL HISTORY: Past Surgical History:  Procedure Laterality Date   COLONOSCOPY     POLYPECTOMY     from nose   TOOTH EXTRACTION Bilateral 08/14/2020   Procedure: DENTAL RESTORATION/EXTRACTIONS;  Surgeon: Diona Browner, DMD;  Location: [facility];  Service: Oral Surgery;  Laterality: Bilateral;    SOCIAL HISTORY: Social History   Socioeconomic History   Marital status: Divorced    Spouse name: Not on file   Number of children: Not on file   Years of education: Not on file   Highest education level: Not on file  Occupational History   Not on file  Tobacco Use   Smoking status: Every Day     Packs/day: 0.25    Types: Cigarettes   Smokeless tobacco: Never   Tobacco comments:    4 cigarettes a day  Substance and Sexual Activity   Alcohol use: Yes    Comment: occ   Drug use: Not Currently    Comment: none for 20 years   Sexual activity: Not on file  Other Topics Concern   Not on file  Social History Narrative   Not on file   Social Determinants of Health   Financial Resource Strain: Not on file  Food Insecurity: Food Insecurity Present (05/07/2018)   Hunger Vital Sign    Worried About Running Out of Food in the Last Year: Sometimes true    Ran Out of Food in the Last Year: Sometimes true  Transportation Needs: Not on file  Physical Activity: Not on file  Stress: Not on file  Social Connections: Not on file  Intimate Partner Violence: Not on file    FAMILY HISTORY: Family History  Problem Relation Age of Onset   Aneurysm Mother    Heart attack Father    Hypertension Father    Hyperlipidemia Father    Renal cancer Brother    Kidney disease Brother    Diabetes Neg Hx    Thyroid disease Neg Hx     ALLERGIES:  has No Known Allergies.  MEDICATIONS:  Current Outpatient Medications  Medication Sig Dispense Refill   aspirin EC 81 MG tablet Take 81 mg by mouth daily. Swallow whole.  BELBUCA 600 MCG FILM Take 600 mcg by mouth every 12 (twelve) hours as needed (Pain).   0   Blood Pressure Monitoring (BLOOD PRESSURE MONITOR DELUXE) KIT 1 kit by Does not apply route 3 (three) times daily. 1 kit 0   carvedilol (COREG) 6.25 MG tablet Take 1 tablet (6.25 mg total) by mouth 2 (two) times daily. Please schedule appt for future refills. 2nd attempt 30 tablet 0   diltiazem (CARDIZEM CD) 240 MG 24 hr capsule Take 240 mg by mouth daily.     DULoxetine (CYMBALTA) 20 MG capsule Take 2 capsules (40 mg total) by mouth daily. 60 capsule 3   fluticasone (FLONASE) 50 MCG/ACT nasal spray Place 2 sprays into both nostrils at bedtime.     hydrochlorothiazide (HYDRODIURIL) 25 MG  tablet Take 1 tablet (25 mg total) by mouth daily. 30 tablet 5   hydrOXYzine (ATARAX/VISTARIL) 50 MG tablet Take 1 tablet (50 mg total) by mouth at bedtime. 30 tablet 3   ibuprofen (ADVIL) 800 MG tablet Take 1 tablet (800 mg total) by mouth every 8 (eight) hours as needed. 30 tablet 0   losartan (COZAAR) 100 MG tablet Take 1 tablet (100 mg total) by mouth daily. 30 tablet 3   lovastatin (MEVACOR) 20 MG tablet Take 1 tablet (20 mg total) by mouth at bedtime. 30 tablet 3   metoprolol succinate (TOPROL-XL) 100 MG 24 hr tablet Take 100 mg by mouth daily. for high blood pressure     nicotine (NICODERM CQ) 21 mg/24hr patch Place 1 patch (21 mg total) onto the skin daily. 28 patch 0   oxyCODONE (OXY IR/ROXICODONE) 5 MG immediate release tablet Take 10 mg by mouth 3 (three) times daily as needed for moderate pain or severe pain.   0   phentermine (ADIPEX-P) 37.5 MG tablet Take 37.5 mg by mouth daily before breakfast.      polyethylene glycol (MIRALAX / GLYCOLAX) packet Take 17 g by mouth daily. 14 each 0   pravastatin (PRAVACHOL) 40 MG tablet Take 40 mg by mouth daily.     PROAIR HFA 108 (90 Base) MCG/ACT inhaler Inhale 1 puff into the lungs 4 (four) times daily as needed for shortness of breath.     Semaglutide, 1 MG/DOSE, (OZEMPIC, 1 MG/DOSE,) 4 MG/3ML SOPN Inject 1 mL into the skin once a week.     sildenafil (VIAGRA) 100 MG tablet Take 0.5-1 tablets (50-100 mg total) by mouth daily as needed for erectile dysfunction. 5 tablet 2   SYMBICORT 160-4.5 MCG/ACT inhaler Inhale 2 puffs into the lungs daily as needed (Shortnesss of breath).      topiramate (TOPAMAX) 50 MG tablet Take 1 tablet (50 mg total) by mouth every evening. 10 tablet 2   traZODone (DESYREL) 100 MG tablet Take 100 mg by mouth at bedtime as needed for sleep.     Vitamin D, Ergocalciferol, (DRISDOL) 1.25 MG (50000 UT) CAPS capsule Take 1,250 capsules by mouth once a week.     No current facility-administered medications for this visit.     REVIEW OF SYSTEMS:   Constitutional: ( - ) fevers, ( - )  chills , ( - ) night sweats Eyes: ( - ) blurriness of vision, ( - ) double vision, ( - ) watery eyes Ears, nose, mouth, throat, and face: ( - ) mucositis, ( - ) sore throat Respiratory: ( - ) cough, ( - ) dyspnea, ( - ) wheezes Cardiovascular: ( - ) palpitation, ( - ) chest discomfort, ( - )  lower extremity swelling Gastrointestinal:  ( - ) nausea, ( - ) heartburn, ( - ) change in bowel habits Skin: ( - ) abnormal skin rashes Lymphatics: ( - ) new lymphadenopathy, ( - ) easy bruising Neurological: ( - ) numbness, ( - ) tingling, ( - ) new weaknesses Behavioral/Psych: ( - ) mood change, ( - ) new changes  All other systems were reviewed with the patient and are negative.  PHYSICAL EXAMINATION:  There were no vitals filed for this visit. There were no vitals filed for this visit.  GENERAL: well appearing *** in NAD  SKIN: skin color, texture, turgor are normal, no rashes or significant lesions EYES: conjunctiva are pink and non-injected, sclera clear LUNGS: clear to auscultation and percussion with normal breathing effort HEART: regular rate & rhythm and no murmurs and no lower extremity edema Musculoskeletal: no cyanosis of digits and no clubbing  PSYCH: alert & oriented x 3, fluent speech NEURO: no focal motor/sensory deficits  LABORATORY DATA:  I have reviewed the data as listed    Latest Ref Rng & Units 08/14/2020    6:24 AM 03/22/2018    5:28 PM 09/20/2017    5:18 PM  CBC  WBC 4.0 - 10.5 K/uL 11.2   9.2   Hemoglobin 13.0 - 17.0 g/dL 14.6  14.6  12.8   Hematocrit 39.0 - 52.0 % 43.7  43.0  39.3   Platelets 150 - 400 K/uL 252   342        Latest Ref Rng & Units 08/14/2020    6:24 AM 04/03/2018   11:32 AM 03/22/2018    5:28 PM  CMP  Glucose 70 - 99 mg/dL 104  136  119   BUN 8 - 23 mg/dL _0 Creatinine 0.61 - 1.24 mg/dL 0.98  0.85  0.90   Sodium 135 - 145 mmol/L 137  140  139   Potassium 3.5 - 5.1  mmol/L 4.2  4.9  4.2   Chloride 98 - 111 mmol/L 100  100  98   CO2 22 - 32 mmol/L 25  25    Calcium 8.9 - 10.3 mg/dL 9.5  9.2    Total Protein 6.0 - 8.5 g/dL  7.0    Total Bilirubin 0.0 - 1.2 mg/dL  0.3    Alkaline Phos 39 - 117 IU/L  97    AST 0 - 40 IU/L  31    ALT 0 - 44 IU/L  68       ASSESSMENT & PLAN ***  After review of the labs, review of the records, and discussion with the patient the patients findings are most consistent with ***  No orders of the defined types were placed in this encounter.   All questions were answered. The patient knows to call the clinic with any problems, questions or concerns.  A total of more than {CHL ONC TIME VISIT - XJOIT:[id number]} were spent on this encounter with face-to-face time and non-face-to-face time, including preparing to see the patient, ordering tests and/or medications, counseling the patient and coordination of care as outlined above.   Ledell Peoples, MD Department of Hematology/Oncology [place] [facility] Phone: [phone] Pager: [phone] Email: [email or web address]  02/23/2022 1:29 PM

## 2022-02-25 LAB — RHEUMATOID FACTOR: Rheumatoid fact SerPl-aCnc: <10 IU/mL (ref <14.0)

## 2022-02-25 LAB — ANTINUCLEAR ANTIBODIES, IFA: ANA Ab, IFA: NEGATIVE

## 2022-03-02 ENCOUNTER — Ambulatory Visit (HOSPITAL_COMMUNITY)
Admission: RE | Admit: 2022-03-02 | Discharge: 2022-03-02 | Disposition: A | Discharge status: Home / Self Care | Payer: Medicaid Other | Source: Ambulatory Visit | Attending: Hematology and Oncology | Admitting: Hematology and Oncology

## 2022-03-02 DIAGNOSIS — D696 Thrombocytopenia, unspecified: Secondary | ICD-10-CM | POA: Diagnosis present

## 2024-02-02 ENCOUNTER — Other Ambulatory Visit: Payer: Self-pay | Admitting: Urology

## 2024-02-02 DIAGNOSIS — N402 Nodular prostate without lower urinary tract symptoms: Secondary | ICD-10-CM

## 2024-02-02 DIAGNOSIS — R972 Elevated prostate specific antigen [PSA]: Secondary | ICD-10-CM

## 2024-02-06 ENCOUNTER — Other Ambulatory Visit: Payer: Self-pay | Admitting: Urology

## 2024-02-06 ENCOUNTER — Encounter: Payer: Self-pay | Admitting: Urology

## 2024-02-06 DIAGNOSIS — N402 Nodular prostate without lower urinary tract symptoms: Secondary | ICD-10-CM

## 2024-02-06 DIAGNOSIS — T1590XA Foreign body on external eye, part unspecified, unspecified eye, initial encounter: Secondary | ICD-10-CM

## 2024-02-06 DIAGNOSIS — R972 Elevated prostate specific antigen [PSA]: Secondary | ICD-10-CM

## 2024-02-11 ENCOUNTER — Other Ambulatory Visit: Payer: Self-pay

## 2024-02-11 ENCOUNTER — Emergency Department (HOSPITAL_COMMUNITY)
Admission: EM | Admit: 2024-02-11 | Discharge: 2024-02-11 | Disposition: A | Discharge status: Home / Self Care | Attending: Emergency Medicine | Admitting: Emergency Medicine

## 2024-02-11 ENCOUNTER — Emergency Department (HOSPITAL_COMMUNITY)

## 2024-02-11 DIAGNOSIS — M25562 Pain in left knee: Secondary | ICD-10-CM | POA: Diagnosis not present

## 2024-02-11 DIAGNOSIS — M25462 Effusion, left knee: Secondary | ICD-10-CM | POA: Insufficient documentation

## 2024-02-11 DIAGNOSIS — Z7982 Long term (current) use of aspirin: Secondary | ICD-10-CM | POA: Insufficient documentation

## 2024-02-11 LAB — COMPREHENSIVE METABOLIC PANEL WITH GFR
ALT: 14 U/L (ref 0–44)
AST: 16 U/L (ref 15–41)
Albumin: 3.6 g/dL (ref 3.5–5.0)
Alkaline Phosphatase: 75 U/L (ref 38–126)
Anion gap: 9 (ref 5–15)
BUN: 12 mg/dL (ref 8–23)
CO2: 24 mmol/L (ref 22–32)
Calcium: 8.8 mg/dL — ABNORMAL LOW (ref 8.9–10.3)
Chloride: 103 mmol/L (ref 98–111)
Creatinine, Ser: 0.83 mg/dL (ref 0.61–1.24)
GFR, Estimated: >60 mL/min (ref >60)
Glucose, Bld: 115 mg/dL — ABNORMAL HIGH (ref 70–99)
Potassium: 3.7 mmol/L (ref 3.5–5.1)
Sodium: 136 mmol/L (ref 135–145)
Total Bilirubin: 0.9 mg/dL (ref 0.0–1.2)
Total Protein: 7.6 g/dL (ref 6.5–8.1)

## 2024-02-11 LAB — SYNOVIAL CELL COUNT + DIFF, W/ CRYSTALS
Crystals, Fluid: NONE SEEN
Lymphocytes-Synovial Fld: 12 % (ref 0–20)
Monocyte-Macrophage-Synovial Fluid: 28 % — ABNORMAL LOW (ref 50–90)
Neutrophil, Synovial: 60 % — ABNORMAL HIGH (ref 0–25)
WBC, Synovial: 2920 /mm3 — ABNORMAL HIGH (ref 0–200)

## 2024-02-11 LAB — CBC WITH DIFFERENTIAL/PLATELET
Abs Immature Granulocytes: 0.09 10*3/uL — ABNORMAL HIGH (ref 0.00–0.07)
Basophils Absolute: 0 10*3/uL (ref 0.0–0.1)
Basophils Relative: 0 %
Eosinophils Absolute: 0.2 10*3/uL (ref 0.0–0.5)
Eosinophils Relative: 2 %
HCT: 46 % (ref 39.0–52.0)
Hemoglobin: 14.8 g/dL (ref 13.0–17.0)
Immature Granulocytes: 1 %
Lymphocytes Relative: 18 %
Lymphs Abs: 1.9 10*3/uL (ref 0.7–4.0)
MCH: 30.5 pg (ref 26.0–34.0)
MCHC: 32.2 g/dL (ref 30.0–36.0)
MCV: 94.8 fL (ref 80.0–100.0)
Monocytes Absolute: 1.1 10*3/uL — ABNORMAL HIGH (ref 0.1–1.0)
Monocytes Relative: 11 %
Neutro Abs: 7.1 10*3/uL (ref 1.7–7.7)
Neutrophils Relative %: 68 %
Platelets: 249 10*3/uL (ref 150–400)
RBC: 4.85 MIL/uL (ref 4.22–5.81)
RDW: 14.7 % (ref 11.5–15.5)
WBC: 10.3 10*3/uL (ref 4.0–10.5)
nRBC: 0 % (ref 0.0–0.2)

## 2024-02-11 MED ORDER — HYDROMORPHONE HCL 1 MG/ML IJ SOLN
1.0000 mg | Freq: Once | INTRAMUSCULAR | Status: AC
  Administered 2024-02-11: 1 mg via INTRAMUSCULAR
  Filled 2024-02-11: qty 1

## 2024-02-11 MED ORDER — OXYCODONE-ACETAMINOPHEN 5-325 MG PO TABS: ORAL_TABLET | ORAL | 0 refills | Status: DC

## 2024-02-11 MED ORDER — LIDOCAINE HCL 2 % IJ SOLN
INTRAMUSCULAR | Status: AC
  Administered 2024-02-11: 400 mg
  Filled 2024-02-11: qty 20

## 2024-02-11 MED ORDER — OXYCODONE-ACETAMINOPHEN 5-325 MG PO TABS: 2.0000 | ORAL_TABLET | ORAL | 0 refills | Status: DC | PRN

## 2024-02-11 MED ORDER — LIDOCAINE HCL 1 % IJ SOLN
INTRAMUSCULAR | Status: AC
  Filled 2024-02-11: qty 20

## 2024-02-11 NOTE — ED Triage Notes (Signed)
 Pt c/o left knee pain and swelling for 2 days. Pt denies any injury to his knee

## 2024-02-11 NOTE — Discharge Instructions (Signed)
 Follow-up with your orthopedic doctor in the next couple weeks.  Keep your leg elevated

## 2024-02-11 NOTE — ED Provider Notes (Signed)
 Thomas Medina Provider Note   CSN: [id number] Arrival date & time: 02/11/24  1505     History {Add pertinent medical, surgical, social history, OB history to HPI:1} Chief Complaint  Patient presents with   Knee Pain    Thomas Medina is a 68 y.o. male.  Patient presents with swelling to his left knee.   Knee Pain      Home Medications Prior to Admission medications   Medication Sig Start Date End Date Taking? Authorizing Provider  aspirin EC 81 MG tablet Take 81 mg by mouth daily. Swallow whole.    [provider]  Blood Pressure Monitoring (BLOOD PRESSURE MONITOR DELUXE) KIT 1 kit by Does not apply route 3 (three) times daily. 08/20/19   [name], NP  carvedilol  (COREG ) 6.25 MG tablet Take 1 tablet (6.25 mg total) by mouth 2 (two) times daily. Please schedule appt for future refills. 2nd attempt 01/04/21   [name], MD  diltiazem (CARDIZEM CD) 240 MG 24 hr capsule Take 240 mg by mouth daily. 07/09/20   [provider]  Dulaglutide (TRULICITY) 1.5 MG/0.5ML SOPN Trulicity 1.5 mg/0.5 mL subcutaneous pen injector  1 (one) Solution Pen-injector under skin weekly    [provider]  DULoxetine  (CYMBALTA ) 20 MG capsule Take 2 capsules (40 mg total) by mouth daily. 05/14/19   [name], NP  fluticasone (FLONASE) 50 MCG/ACT nasal spray Place 2 sprays into both nostrils at bedtime. 02/25/20   [provider]  hydrochlorothiazide  (HYDRODIURIL ) 25 MG tablet Take 1 tablet (25 mg total) by mouth daily. 08/20/19   [name], NP  hydrOXYzine  (ATARAX /VISTARIL ) 50 MG tablet Take 1 tablet (50 mg total) by mouth at bedtime. 05/14/19   [name], NP  ibuprofen  (ADVIL ) 800 MG tablet Take 1 tablet (800 mg total) by mouth every 8 (eight) hours as needed. 08/14/20   [name], DMD  losartan  (COZAAR ) 100 MG tablet Take 1 tablet (100 mg total) by mouth daily. 05/14/19   [name], NP  lovastatin  (MEVACOR ) 20 MG tablet Take 1 tablet (20 mg total) by mouth at bedtime. 04/23/19   [name], NP  metoprolol  succinate (TOPROL -XL) 100 MG 24 hr tablet Take 100 mg by mouth daily. for high blood pressure 08/04/20   [provider]  naloxone (NARCAN) nasal spray 4 mg/0.1 mL naloxone 4 mg/actuation nasal spray  1 (one) Spray as needed    [provider]  nicotine  (NICODERM CQ ) 21 mg/24hr patch Place 1 patch (21 mg total) onto the skin daily. Patient not taking: Reported on 02/23/2022 08/14/20   [name], DMD  oxyCODONE -acetaminophen  (PERCOCET) 5-325 MG tablet Take 1 every 6 hours as needed for pain not relieved by Tylenol  alone 02/11/24   Thomas Deines, MD  phentermine (ADIPEX-P) 37.5 MG tablet Take 37.5 mg by mouth daily before breakfast.  09/17/18   [provider]  polyethylene glycol (MIRALAX  / GLYCOLAX ) packet Take 17 g by mouth daily. 11/19/18   [name], MD  pravastatin (PRAVACHOL) 40 MG tablet Take 40 mg by mouth daily. 07/23/20   [provider]  PROAIR HFA 108 (90 Base) MCG/ACT inhaler Inhale 1 puff into the lungs 4 (four) times daily as needed for shortness of breath. 06/26/20   [provider]  Semaglutide, 1 MG/DOSE, (OZEMPIC, 1 MG/DOSE,) 4 MG/3ML SOPN Inject 1 mL into the skin once a week. Patient not taking: Reported on 02/23/2022 04/13/20   [provider]  SYMBICORT 160-4.5 MCG/ACT inhaler Inhale 2 puffs into the lungs daily as needed (Shortnesss of breath).  09/11/18   [provider]  tadalafil (CIALIS) 10 MG tablet tadalafil 10 mg tablet  1 (one) Tablet Tablet by mouth as needed    [provider]  topiramate  (TOPAMAX ) 50 MG tablet Take 1 tablet (50 mg total) by mouth every evening. 05/14/19   [name], NP  traZODone (DESYREL) 100 MG tablet Take 100 mg by mouth at bedtime as needed for sleep.    [provider]  Vitamin D, Ergocalciferol, (DRISDOL) 1.25 MG  (50000 UT) CAPS capsule Take 1,250 capsules by mouth once a week. 04/16/19   [provider]      Allergies    Patient has no known allergies.    Review of Systems   Review of Systems  Physical Exam Updated Vital Signs BP (!) 171/110   Pulse (!) 115   Temp 98.3 F (36.8 C) (Oral)   Resp 18   SpO2 96%  Physical Exam  ED Results / Procedures / Treatments   Labs (all labs ordered are listed, but only abnormal results are displayed) Labs Reviewed  CBC WITH DIFFERENTIAL/PLATELET - Abnormal; Notable for the following components:      Result Value   Monocytes Absolute 1.1 (*)    Abs Immature Granulocytes 0.09 (*)    All other components within normal limits  COMPREHENSIVE METABOLIC PANEL WITH GFR - Abnormal; Notable for the following components:   Glucose, Bld 115 (*)    Calcium 8.8 (*)    All other components within normal limits  SYNOVIAL CELL COUNT + DIFF, W/ CRYSTALS - Abnormal; Notable for the following components:   Color, Synovial PINK (*)    Appearance-Synovial CLOUDY (*)    WBC, Synovial 2,920 (*)    Neutrophil, Synovial 60 (*)    Monocyte-Macrophage-Synovial Fluid 28 (*)    All other components within normal limits  BODY FLUID CULTURE W GRAM STAIN  GLUCOSE, BODY FLUID OTHER            PROTEIN, BODY FLUID (OTHER)    EKG None  Radiology DG Knee Complete 4 Views Left Result Date: 02/11/2024 CLINICAL DATA:  Knee pain and swelling.  No known injury. EXAM: LEFT KNEE - COMPLETE 4+ VIEW COMPARISON:  None Available. FINDINGS: No acute fracture or dislocation. Mild tricompartmental osteoarthritis with peripheral spurring. Mild spurring of the tibial spines. There is a moderate joint effusion. Generalized soft tissue edema. No erosive or bony destructive change. Small quadriceps tendon enthesophyte. IMPRESSION: 1. Mild tricompartmental osteoarthritis. 2. Moderate joint effusion. 3. Generalized soft tissue edema. Electronically Signed   By: Chadwick Colonel M.D.   On:  02/11/2024 16:26    Procedures Procedures  {Document cardiac monitor, telemetry assessment procedure when appropriate:1}  Medications Ordered in ED Medications  lidocaine  (XYLOCAINE ) 1 % (with pres) injection (  Not Given 02/11/24 1726)  HYDROmorphone (DILAUDID) injection 1 mg (1 mg Intramuscular Given 02/11/24 1626)  lidocaine  (XYLOCAINE ) 2 % (with pres) injection (400 mg  Given 02/11/24 1726)  HYDROmorphone (DILAUDID) injection 1 mg (1 mg Intramuscular Given 02/11/24 2005)    ED Course/ Medical Decision Making/ A&P  30 cc of blood-tinged fluid was removed from his left knee {   Click here for ABCD2, HEART and other calculatorsREFRESH Note before signing :1}  Medical Decision Making Amount and/or Complexity of Data Reviewed Labs: ordered. Radiology: ordered.  Risk Prescription drug management.   Patient with effusion of the left knee and osteoarthritis.  No infection.  Patient placed on Percocets and will follow-up with Ortho  {Document critical care time when appropriate:1} {Document review of labs and clinical decision tools ie heart score, Chads2Vasc2 etc:1}  {Document your independent review of radiology images, and any outside records:1} {Document your discussion with family members, caretakers, and with consultants:1} {Document social determinants of health affecting pt's care:1} {Document your decision making why or why not admission, treatments were needed:1} Final Clinical Impression(s) / ED Diagnoses Final diagnoses:  Effusion of left knee    Rx / DC Orders ED Discharge Orders          Ordered    oxyCODONE -acetaminophen  (PERCOCET) 5-325 MG tablet  Every 4 hours PRN,   Status:  Discontinued        02/11/24 2032    oxyCODONE -acetaminophen  (PERCOCET) 5-325 MG tablet        02/11/24 2033

## 2024-02-12 LAB — GLUCOSE, BODY FLUID OTHER: Glucose, Body Fluid Other: 50 mg/dL

## 2024-02-14 LAB — PROTEIN, BODY FLUID (OTHER): Total Protein, Body Fluid Other: 4.8 g/dL

## 2024-02-15 LAB — BODY FLUID CULTURE W GRAM STAIN

## 2024-02-27 ENCOUNTER — Other Ambulatory Visit: Payer: Self-pay | Admitting: Urology

## 2024-02-27 DIAGNOSIS — R972 Elevated prostate specific antigen [PSA]: Secondary | ICD-10-CM

## 2024-02-27 DIAGNOSIS — N402 Nodular prostate without lower urinary tract symptoms: Secondary | ICD-10-CM

## 2024-02-27 DIAGNOSIS — T1590XA Foreign body on external eye, part unspecified, unspecified eye, initial encounter: Secondary | ICD-10-CM

## 2024-03-04 ENCOUNTER — Ambulatory Visit
Admission: RE | Admit: 2024-03-04 | Discharge: 2024-03-04 | Disposition: A | Discharge status: Home / Self Care | Source: Ambulatory Visit | Attending: Urology | Admitting: Urology

## 2024-03-04 DIAGNOSIS — T1590XA Foreign body on external eye, part unspecified, unspecified eye, initial encounter: Secondary | ICD-10-CM

## 2024-03-10 ENCOUNTER — Ambulatory Visit
Admission: RE | Admit: 2024-03-10 | Discharge: 2024-03-10 | Disposition: A | Discharge status: Home / Self Care | Source: Ambulatory Visit | Attending: Urology

## 2024-03-10 DIAGNOSIS — R972 Elevated prostate specific antigen [PSA]: Secondary | ICD-10-CM

## 2024-03-10 DIAGNOSIS — N402 Nodular prostate without lower urinary tract symptoms: Secondary | ICD-10-CM

## 2024-03-10 MED ORDER — GADOPICLENOL 0.5 MMOL/ML IV SOLN
10.0000 mL | Freq: Once | INTRAVENOUS | Status: AC | PRN
  Administered 2024-03-10: 10 mL via INTRAVENOUS

## 2024-03-28 ENCOUNTER — Other Ambulatory Visit: Payer: Self-pay

## 2024-03-28 ENCOUNTER — Other Ambulatory Visit (INDEPENDENT_AMBULATORY_CARE_PROVIDER_SITE_OTHER)

## 2024-03-28 ENCOUNTER — Encounter: Payer: Self-pay | Admitting: Orthopedic Surgery

## 2024-03-28 ENCOUNTER — Ambulatory Visit (INDEPENDENT_AMBULATORY_CARE_PROVIDER_SITE_OTHER): Admitting: Orthopedic Surgery

## 2024-03-28 VITALS — Ht 67.5 in | Wt 226.4 lb

## 2024-03-28 DIAGNOSIS — M1712 Unilateral primary osteoarthritis, left knee: Secondary | ICD-10-CM

## 2024-03-28 DIAGNOSIS — M25562 Pain in left knee: Secondary | ICD-10-CM

## 2024-03-28 MED ORDER — BUPIVACAINE HCL 0.25 % IJ SOLN
4.0000 mL | INTRAMUSCULAR | Status: AC | PRN
  Administered 2024-03-28: 4 mL via INTRA_ARTICULAR

## 2024-03-28 MED ORDER — TRIAMCINOLONE ACETONIDE 40 MG/ML IJ SUSP
40.0000 mg | INTRAMUSCULAR | Status: AC | PRN
Start: 2024-03-28 — End: 2024-03-28
  Administered 2024-03-28: 40 mg via INTRA_ARTICULAR

## 2024-03-28 MED ORDER — LIDOCAINE HCL 1 % IJ SOLN
5.0000 mL | INTRAMUSCULAR | Status: AC | PRN
  Administered 2024-03-28: 5 mL

## 2024-03-28 NOTE — Progress Notes (Signed)
 Office Visit Note   Patient: Thomas Medina           Date of Birth: 09-Jul-1956           MRN: [id number] Visit Date: 03/28/2024 Requested by: [name], NP [id number] [address],  [place] [address] PCP: [name], NP  Subjective: Chief Complaint  Patient presents with   Left Knee - Pain    HPI: Thomas Medina is a 68 y.o. male who presents to the office reporting left knee pain.  Knee pain started about a month ago without any history of injury.  Has a history of remote injury to the left knee which was a crush injury about 20 years ago.  Has been seen in the emergency department with aspiration of the knee performed.  This did not give him too much relief.  Has been taking some over-the-counter medication without much relief.  Denies any instability or mechanical symptoms in the knee..                ROS: All systems reviewed are negative as they relate to the chief complaint within the history of present illness.  Patient denies fevers or chills.  Assessment & Plan: Visit Diagnoses:  1. Left knee pain, unspecified chronicity     Plan: Impression is left knee exacerbation of pre-existing arthritis.  No effusion but there is a little bit of bogginess in the knee.  Radiographs do show joint space narrowing both in the medial and lateral and patellofemoral compartments.  Symptoms only ongoing for months.  Cortisone shot may reset his knee to the weight was 2 months ago.  Cortisone shot performed.  Will see how he does.  Follow-up as needed.  Follow-Up Instructions: No follow-ups on file.   Orders:  Orders Placed This Encounter  Procedures   XR Knee 1-2 Views Left   XR KNEE 3 VIEW LEFT   No orders of the defined types were placed in this encounter.     Procedures: Large Joint Inj: L knee on 03/28/2024 5:31 PM Indications: diagnostic evaluation, joint swelling and pain Details: 18 G 1.5 in needle, superolateral approach  Arthrogram: No  Medications: 5 mL lidocaine   1 %; 4 mL bupivacaine  0.25 %; 40 mg triamcinolone  acetonide 40 MG/ML Outcome: tolerated well, no immediate complications Procedure, treatment alternatives, risks and benefits explained, specific risks discussed. Consent was given by the patient. Immediately prior to procedure a time out was called to verify the correct patient, procedure, equipment, support staff and site/side marked as required. Patient was prepped and draped in the usual sterile fashion.       Clinical Data: No additional findings.  Objective: Vital Signs: Ht 5' 7.5 (1.715 m)   Wt 226 lb 6.4 oz (102.7 kg)   BMI 34.94 kg/m   Physical Exam:  Constitutional: Patient appears well-developed HEENT:  Head: Normocephalic Eyes:EOM are normal Neck: Normal range of motion Cardiovascular: Normal rate Pulmonary/chest: Effort normal Neurologic: Patient is alert Skin: Skin is warm Psychiatric: Patient has normal mood and affect  Ortho Exam: Ortho exam demonstrates slightly antalgic gait to the left.  Pedal pulses palpable bilaterally.  Lacks about 6 or 7 degrees of full extension on the left right leg has full extension.  Bends to about 105 on the left and 115-120 on the right.  Collateral cruciate ligaments are stable in the left-hand side.  Pedal pulses palpable.  Ankle dorsiflexion intact on the left.  No groin pain with internal/external Tatian of the leg.  No other masses lymphadenopathy or skin changes noted in that left knee region.  Specialty Comments:  No specialty comments available.  Imaging: No results found.   PMFS History: Patient Active Problem List   Diagnosis Date Noted   Metabolic syndrome 10/13/2019   Tobacco dependence 07/09/2018   Bilateral leg edema 07/09/2018   Class 3 severe obesity due to excess calories without serious comorbidity with body mass index (BMI) of 45.0 to 49.9 in adult 07/09/2018   Mixed hyperlipidemia 03/29/2017   Essential hypertension 03/27/2017   Past Medical History:   Diagnosis Date   Bursitis    both shoulders and lower back   COPD (chronic obstructive pulmonary disease) ([facility])    Depression    Diabetes mellitus without complication ([facility])    Diverticulitis    Hyperlipidemia    Hypertension    Pneumonia    PONV (postoperative nausea and vomiting)    with nasal polypectomy, no problems when he had colonoscopy   PTSD (post-traumatic stress disorder)    Sleep apnea    uses a bipap   Vision abnormalities     Family History  Problem Relation Age of Onset   Aneurysm Mother    Heart attack Father    Hypertension Father    Hyperlipidemia Father    Renal cancer Brother    Kidney disease Brother    Diabetes Neg Hx    Thyroid  disease Neg Hx     Past Surgical History:  Procedure Laterality Date   COLONOSCOPY     POLYPECTOMY     from nose   TOOTH EXTRACTION Bilateral 08/14/2020   Procedure: DENTAL RESTORATION/EXTRACTIONS;  Surgeon: Sheryle Hamilton, DMD;  Location: [facility] OR;  Service: Oral Surgery;  Laterality: Bilateral;   Social History   Occupational History   Not on file  Tobacco Use   Smoking status: Every Day    Current packs/day: 0.25    Types: Cigarettes   Smokeless tobacco: Never   Tobacco comments:    4 cigarettes a day  Substance and Sexual Activity   Alcohol use: Yes    Comment: occ   Drug use: Not Currently    Comment: none for 20 years   Sexual activity: Not on file

## 2024-04-15 ENCOUNTER — Telehealth: Payer: Self-pay

## 2024-04-15 NOTE — Telephone Encounter (Signed)
 I have been asked that you please delete one of your duplicate transcriptions 03/28/2024

## 2024-04-19 NOTE — Telephone Encounter (Signed)
 Can't find duplicate

## 2024-05-07 ENCOUNTER — Other Ambulatory Visit (HOSPITAL_COMMUNITY): Payer: Self-pay | Admitting: Urology

## 2024-05-07 DIAGNOSIS — C61 Malignant neoplasm of prostate: Secondary | ICD-10-CM

## 2024-05-15 ENCOUNTER — Encounter (HOSPITAL_COMMUNITY)
Admission: RE | Admit: 2024-05-15 | Discharge: 2024-05-15 | Disposition: A | Discharge status: Home / Self Care | Source: Ambulatory Visit | Attending: Urology | Admitting: Urology

## 2024-05-15 DIAGNOSIS — C61 Malignant neoplasm of prostate: Secondary | ICD-10-CM | POA: Insufficient documentation

## 2024-05-15 MED ORDER — FLOTUFOLASTAT F 18 GALLIUM 296-5846 MBQ/ML IV SOLN
8.4000 | Freq: Once | INTRAVENOUS | Status: AC
Start: 2024-05-15 — End: 2024-05-15
  Administered 2024-05-15: 8.4 via INTRAVENOUS

## 2024-06-19 NOTE — Progress Notes (Incomplete)
 GU Location of Tumor / Histology: Prostate Ca  If Prostate Cancer, Gleason Score is (4 + 4) and PSA is (15.30 on 01/31/2024)  Thomas Medina presented as referral from Dr. Sherwood BIRCH. [name] [facility]) elevated PSA.  Biopsies       05/15/2024 Dr. Sherwood D. Bell III NM PET (PSMA) Skull to Mid Thigh CLINICAL DATA:  High-risk prostate carcinoma. Staging.   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 No other sites of metastatic disease identified.   Past/Anticipated interventions by urology, if any:  Dr. Sherwood BIRCH. Bell III   Past/Anticipated interventions by medical oncology, if any: NA  Weight changes, if any: {:18581}  IPSS: SHIM:  Bowel/Bladder complaints, if any: {:18581}   Nausea/Vomiting, if any: {:18581}  Pain issues, if any:  {:18581}  SAFETY ISSUES: Prior radiation? {:18581} Pacemaker/ICD? {:18581} Possible current pregnancy? Male Is the patient on methotrexate? No  Current Complaints / other details:     30 minutes spent total, including time for meaningful use questions, reviewing medication, as well as spent in face-to-face time in nurse evaluation with the patient.

## 2024-06-24 ENCOUNTER — Ambulatory Visit
Admission: RE | Admit: 2024-06-24 | Discharge: 2024-06-24 | Disposition: A | Discharge status: Home / Self Care | Source: Ambulatory Visit | Attending: Radiation Oncology | Admitting: Radiation Oncology

## 2024-06-24 ENCOUNTER — Telehealth: Payer: Self-pay

## 2024-06-24 ENCOUNTER — Ambulatory Visit

## 2024-06-24 NOTE — Progress Notes (Incomplete)
 Radiation Oncology         [phone] ________________________________  Initial Outpatient Consultation  Name: Thomas Medina MRN: [id number]  Date: 06/24/2024  DOB: 1956-04-23  CC:Thomas Millman, NP  Thomas Medina Thomas DOUGLAS, MD   REFERRING PHYSICIAN: Carolee Medina Thomas DOUGLAS, MD  DIAGNOSIS: 68 y.o. 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  No diagnosis found.  HISTORY OF PRESENT ILLNESS: Thomas Medina is a 68 y.o. male with a diagnosis of prostate cancer. He was initially referred for evaluation in urology by Dr. Carolee on 05/10/19 for an elevated PSA of 5.6. A repeat PSA showed normalization at 3.98, thus the patient returned to his PCP for surveillance. More recently, he was noted to have an elevated PSA of 19.2 by his primary care physician, Medina Leron, NP.  Accordingly, he was referred back to Dr. Carolee on 01/30/24,  digital rectal examination performed at that time showed firmness and nodularity on the right. A repeat PSA obtained that day showed a drop but persistent elevation at 15.3. He underwent a prostate MRI on 03/10/24 showing: large lesion in right lobe (PI-RADS 5); prostate capsule intact, seminal vesicles normal; enlarged right external iliac lymph node concerning for nodal metastasis; indeterminate small left common iliac lymph node. The patient proceeded to MRI fusion biopsy of the prostate on 04/05/24.  The prostate volume measured 70.33 cc.  Out of 16 core biopsies, 14 were positive.  The maximum Gleason score was 4+4, and this was seen in right apex, right base lateral (with perineural invasion), and right mid lateral. Additionally, Gleason 4+3 was seen in all four cores from the MRI ROI (with PNI), left mid lateral, right base (with PNI), right mid, and right apex lateral (with PNI), and Gleason 3+4 in left mid and left apex lateral.  He underwent a staging PSMA PET scan on 05/15/24 showing: large area of intense radiotracer  accumulation involving majority of prostate gland; right perirectal and iliac lymph node metastases; no other sites of metastatic disease.  The patient reviewed the biopsy results with his urologist and he has kindly been referred today for discussion of potential radiation treatment options.   PREVIOUS RADIATION THERAPY: No  PAST MEDICAL HISTORY:  Past Medical History:  Diagnosis Date   Bursitis    both shoulders and lower back   COPD (chronic obstructive pulmonary disease) (HCC)    Depression    Diabetes mellitus without complication (HCC)    Diverticulitis    Hyperlipidemia    Hypertension    Pneumonia    PONV (postoperative nausea and vomiting)    with nasal polypectomy, no problems when he had colonoscopy   PTSD (post-traumatic stress disorder)    Sleep apnea    uses a bipap   Vision abnormalities       PAST SURGICAL HISTORY: Past Surgical History:  Procedure Laterality Date   COLONOSCOPY     POLYPECTOMY     from nose   TOOTH EXTRACTION Bilateral 08/14/2020   Procedure: DENTAL RESTORATION/EXTRACTIONS;  Surgeon: Sheryle Hamilton, DMD;  Location: [facility] OR;  Service: Oral Surgery;  Laterality: Bilateral;    FAMILY HISTORY:  Family History  Problem Relation Age of Onset   Aneurysm Mother    Heart attack Father    Hypertension Father    Hyperlipidemia Father    Renal cancer Brother    Kidney disease Brother    Diabetes Neg Hx    Thyroid  disease Neg Hx  SOCIAL HISTORY:  Social History   Socioeconomic History   Marital status: Divorced    Spouse name: Not on file   Number of children: Not on file   Years of education: Not on file   Highest education level: Not on file  Occupational History   Not on file  Tobacco Use   Smoking status: Every Day    Current packs/day: 0.25    Types: Cigarettes   Smokeless tobacco: Never   Tobacco comments:    4 cigarettes a day  Substance and Sexual Activity   Alcohol use: Yes    Comment: occ   Drug use: Not Currently     Comment: none for 20 years   Sexual activity: Not on file  Other Topics Concern   Not on file  Social History Narrative   Not on file   Social Drivers of Health   Financial Resource Strain: Not on file  Food Insecurity: Food Insecurity Present (05/07/2018)   Hunger Vital Sign    Worried About Running Out of Food in the Last Year: Sometimes true    Ran Out of Food in the Last Year: Sometimes true  Transportation Needs: Not on file  Physical Activity: Not on file  Stress: Not on file  Social Connections: Not on file  Intimate Partner Violence: Not on file    ALLERGIES: Patient has no known allergies.  MEDICATIONS:  Current Outpatient Medications  Medication Sig Dispense Refill   aspirin EC 81 MG tablet Take 81 mg by mouth daily. Swallow whole.     Blood Pressure Monitoring (BLOOD PRESSURE MONITOR DELUXE) KIT 1 kit by Does not apply route 3 (three) times daily. 1 kit 0   carvedilol  (COREG ) 6.25 MG tablet Take 1 tablet (6.25 mg total) by mouth 2 (two) times daily. Please schedule appt for future refills. 2nd attempt 30 tablet 0   diltiazem (CARDIZEM CD) 240 MG 24 hr capsule Take 240 mg by mouth daily.     Dulaglutide (TRULICITY) 1.5 MG/0.5ML SOPN Trulicity 1.5 mg/0.5 mL subcutaneous pen injector  1 (one) Solution Pen-injector under skin weekly     DULoxetine  (CYMBALTA ) 20 MG capsule Take 2 capsules (40 mg total) by mouth daily. 60 capsule 3   fluticasone (FLONASE) 50 MCG/ACT nasal spray Place 2 sprays into both nostrils at bedtime.     hydrochlorothiazide  (HYDRODIURIL ) 25 MG tablet Take 1 tablet (25 mg total) by mouth daily. 30 tablet 5   hydrOXYzine  (ATARAX /VISTARIL ) 50 MG tablet Take 1 tablet (50 mg total) by mouth at bedtime. 30 tablet 3   ibuprofen  (ADVIL ) 800 MG tablet Take 1 tablet (800 mg total) by mouth every 8 (eight) hours as needed. 30 tablet 0   losartan  (COZAAR ) 100 MG tablet Take 1 tablet (100 mg total) by mouth daily. 30 tablet 3   lovastatin  (MEVACOR ) 20 MG tablet  Take 1 tablet (20 mg total) by mouth at bedtime. 30 tablet 3   metoprolol  succinate (TOPROL -XL) 100 MG 24 hr tablet Take 100 mg by mouth daily. for high blood pressure     naloxone (NARCAN) nasal spray 4 mg/0.1 mL naloxone 4 mg/actuation nasal spray  1 (one) Spray as needed     nicotine  (NICODERM CQ ) 21 mg/24hr patch Place 1 patch (21 mg total) onto the skin daily. (Patient not taking: Reported on 02/23/2022) 28 patch 0   oxyCODONE -acetaminophen  (PERCOCET) 5-325 MG tablet Take 1 every 6 hours as needed for pain not relieved by Tylenol  alone 20 tablet 0   phentermine (  ADIPEX-P) 37.5 MG tablet Take 37.5 mg by mouth daily before breakfast.      polyethylene glycol (MIRALAX  / GLYCOLAX ) packet Take 17 g by mouth daily. 14 each 0   pravastatin (PRAVACHOL) 40 MG tablet Take 40 mg by mouth daily.     PROAIR HFA 108 (90 Base) MCG/ACT inhaler Inhale 1 puff into the lungs 4 (four) times daily as needed for shortness of breath.     Semaglutide, 1 MG/DOSE, (OZEMPIC, 1 MG/DOSE,) 4 MG/3ML SOPN Inject 1 mL into the skin once a week. (Patient not taking: Reported on 02/23/2022)     SYMBICORT 160-4.5 MCG/ACT inhaler Inhale 2 puffs into the lungs daily as needed (Shortnesss of breath).      tadalafil (CIALIS) 10 MG tablet tadalafil 10 mg tablet  1 (one) Tablet Tablet by mouth as needed     topiramate  (TOPAMAX ) 50 MG tablet Take 1 tablet (50 mg total) by mouth every evening. 10 tablet 2   traZODone (DESYREL) 100 MG tablet Take 100 mg by mouth at bedtime as needed for sleep.     Vitamin D, Ergocalciferol, (DRISDOL) 1.25 MG (50000 UT) CAPS capsule Take 1,250 capsules by mouth once a week.     No current facility-administered medications for this encounter.    REVIEW OF SYSTEMS:  On review of systems, the patient reports that he is doing well overall. He denies any chest pain, shortness of breath, cough, fevers, chills, night sweats, unintended weight changes. He denies any bowel disturbances, and denies abdominal pain,  nausea or vomiting. He denies any new musculoskeletal or joint aches or pains. His IPSS was ***, indicating *** urinary symptoms. His SHIM was ***, indicating he {does not have/has mild/moderate/severe} erectile dysfunction. A complete review of systems is obtained and is otherwise negative.    PHYSICAL EXAM:  Wt Readings from Last 3 Encounters:  03/28/24 226 lb 6.4 oz (102.7 kg)  02/23/22 265 lb 1.6 oz (120.2 kg)  02/05/21 248 lb (112.5 kg)   Temp Readings from Last 3 Encounters:  02/11/24 98.3 F (36.8 C) (Oral)  02/23/22 98.3 F (36.8 C) (Oral)  02/05/21 (!) 97.4 F (36.3 C) (Oral)   BP Readings from Last 3 Encounters:  02/11/24 (!) 171/110  02/23/22 (!) 150/68  02/05/21 (!) 141/83   Pulse Readings from Last 3 Encounters:  02/11/24 (!) 115  02/23/22 92  02/05/21 89    /10  In general this is a well appearing *** male in no acute distress. He's alert and oriented x4 and appropriate throughout the examination. Cardiopulmonary assessment is negative for acute distress, and he exhibits normal effort.     KPS = ***  100 - Normal; no complaints; no evidence of disease. 90   - Able to carry on normal activity; minor signs or symptoms of disease. 80   - Normal activity with effort; some signs or symptoms of disease. 85   - Cares for self; unable to carry on normal activity or to do active work. 60   - Requires occasional assistance, but is able to care for most of his personal needs. 50   - Requires considerable assistance and frequent medical care. 40   - Disabled; requires special care and assistance. 30   - Severely disabled; hospital admission is indicated although death not imminent. 20   - Very sick; hospital admission necessary; active supportive treatment necessary. 10   - Moribund; fatal processes progressing rapidly. 0     - Dead  Karnofsky DA, [place],  Craver LS and Burchenal JH (1948) The use of the nitrogen mustards in the palliative treatment of carcinoma:  with particular reference to bronchogenic carcinoma Cancer 1 634-56  LABORATORY DATA:  Lab Results  Component Value Date   WBC 10.3 02/11/2024   HGB 14.8 02/11/2024   HCT 46.0 02/11/2024   MCV 94.8 02/11/2024   PLT 249 02/11/2024   Lab Results  Component Value Date   NA 136 02/11/2024   K 3.7 02/11/2024   CL 103 02/11/2024   CO2 24 02/11/2024   Lab Results  Component Value Date   ALT 14 02/11/2024   AST 16 02/11/2024   ALKPHOS 75 02/11/2024   BILITOT 0.9 02/11/2024     RADIOGRAPHY: No results found.    IMPRESSION/PLAN: 1. 68 y.o. gentleman with Stage T*** adenocarcinoma of the prostate with Gleason Score of 4+4, and PSA of 15.3. We discussed the patient's workup and outlined the nature of prostate cancer in this setting. The patient's T stage, Gleason's score, and PSA put him into the *** risk group. Accordingly, he is eligible for a variety of potential treatment options including LT-ADT concurrent with 8 weeks of external radiation, 5 weeks of external radiation with an upfront brachytherapy boost, or prostatectomy. We discussed the available radiation techniques, and focused on the details and logistics of delivery. We discussed and outlined the risks, benefits, short and long-term effects associated with radiotherapy and compared and contrasted these with prostatectomy. We discussed the role of SpaceOAR gel in reducing the rectal toxicity associated with radiotherapy. We also detailed the role of ADT in the treatment of high risk prostate cancer and outlined the associated side effects that could be expected with this therapy. He appears to have a good understanding of his disease and our treatment recommendations which are of curative intent.  He was encouraged to ask questions that were answered to his stated satisfaction.  At the conclusion of our conversation, the patient is interested in moving forward with ***.  We personally spent *** minutes in this encounter including  chart review, reviewing radiological studies, meeting face-to-face with the patient, entering orders and completing documentation.    Sabra MICAEL Rusk, PA-C    Donnice Barge, MD  [facility]  Radiation Oncology Direct Dial: [phone]  Fax: [phone] [email or web address]  Skype  [email or web address]   This document serves as a record of services personally performed by Donnice Barge, MD and Sabra Rusk, PA-C. It was created on their behalf by Izetta Neither, a trained medical scribe. The creation of this record is based on the scribe's personal observations and the provider's statements to them. This document has been checked and approved by the attending provider.

## 2024-06-24 NOTE — Telephone Encounter (Signed)
 RN called patient to see if he would be making his appointment this morning no answer and his mailbox was full.  RN attempted to reach out to Thomas Medina daughter was unable to get her left message for her to call us  back or to have Thomas Medina call us .

## 2024-07-04 NOTE — Progress Notes (Incomplete)
 GU Location of Tumor / Histology: Prostate Ca  If Prostate Cancer, Gleason Score is (4 + 4) and PSA is (15.30 on 01/31/2024)  Thomas Medina presented as referral from Dr. Sherwood BIRCH. [name] [facility]) elevated PSA.  Biopsies       05/15/2024 Dr. Sherwood D. Bell III NM PET (PSMA) Skull to Mid Thigh CLINICAL DATA:  High-risk prostate carcinoma. Staging.   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 No other sites of metastatic disease identified.   Past/Anticipated interventions by urology, if any:  Dr. Sherwood BIRCH. Bell III   Past/Anticipated interventions by medical oncology, if any: NA  Weight changes, if any: {:18581}  IPSS: SHIM:  Bowel/Bladder complaints, if any: {:18581}   Nausea/Vomiting, if any: {:18581}  Pain issues, if any:  {:18581}  SAFETY ISSUES: Prior radiation? {:18581} Pacemaker/ICD? {:18581} Possible current pregnancy? Male Is the patient on methotrexate? No  Current Complaints / other details:     30 minutes spent total, including time for meaningful use questions, reviewing medication, as well as spent in face-to-face time in nurse evaluation with the patient.

## 2024-07-09 ENCOUNTER — Ambulatory Visit
Admission: RE | Admit: 2024-07-09 | Discharge: 2024-07-09 | Disposition: A | Discharge status: Home / Self Care | Source: Ambulatory Visit | Attending: Radiation Oncology | Admitting: Radiation Oncology

## 2024-07-09 ENCOUNTER — Ambulatory Visit

## 2024-07-09 DIAGNOSIS — C61 Malignant neoplasm of prostate: Secondary | ICD-10-CM | POA: Insufficient documentation

## 2024-07-09 NOTE — Progress Notes (Signed)
 Radiation Oncology         [phone] ________________________________  Initial Outpatient Consultation  Name: Thomas Medina MRN: [id number]  Date: 07/12/2024  DOB: 1955-10-10  CC:Thomas Millman, NP  Carolee Sherwood JONETTA DOUGLAS, MD   REFERRING PHYSICIAN: Carolee Sherwood JONETTA DOUGLAS, MD  DIAGNOSIS: 68 y.o. 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    ICD-10-CM   1. Malignant neoplasm of prostate (HCC)  C61       HISTORY OF PRESENT ILLNESS: Loraine Freid is a 68 y.o. male with a diagnosis of prostate cancer. He was initially referred for evaluation in urology by Dr. Carolee on 05/10/19 for an elevated PSA of 5.6. A repeat PSA had normalized at 3.98, thus the patient returned to his PCP for continued monitoring. More recently, he was noted to have an elevated PSA of 19.2 by his primary care provider, Medina Leron, NP.  Accordingly, he was referred back to Dr. Carolee on 01/30/24,  digital rectal examination performed at that time showed firmness and nodularity on the right. A repeat PSA obtained that day remained significantly elevated at 15.3. He underwent a prostate MRI on 03/10/24 showing a large PI-RADS 5 lesion in the right lobe with an enlarged 2 cm right external iliac lymph node concerning for nodal metastasis and an indeterminate small 4 mm left common iliac lymph node. The patient proceeded to MRI fusion biopsy of the prostate on 04/05/24.  The prostate volume measured 70.33 cc.  Out of 16 core biopsies, 14 were positive.  The maximum Gleason score was 4+4, and this was seen in the right apex, right base lateral (with perineural invasion), and right mid lateral. Additionally, Gleason 4+3 was seen in all four cores from the MRI ROI (with PNI) as well as in the left mid lateral, right base (with PNI), right mid, and right apex lateral (with PNI), and Gleason 3+4 in the left mid and left apex lateral.  He underwent a staging PSMA  PET scan on 05/15/24 showing a large area of intense radiotracer accumulation involving the majority of the prostate gland and right perirectal and iliac lymph node metastases but no other sites of metastatic disease.  The patient reviewed the biopsy and imaging results with his urologist and he has kindly been referred today for discussion of potential radiation treatment options. He has also met with Dr. Gwenith, in [facility], for a second opinion.   PREVIOUS RADIATION THERAPY: No  PAST MEDICAL HISTORY:  Past Medical History:  Diagnosis Date   Bursitis    both shoulders and lower back   COPD (chronic obstructive pulmonary disease) (HCC)    Depression    Diabetes mellitus without complication (HCC)    Diverticulitis    Hyperlipidemia    Hypertension    Pneumonia    PONV (postoperative nausea and vomiting)    with nasal polypectomy, no problems when he had colonoscopy   PTSD (post-traumatic stress disorder)    Sleep apnea    uses a bipap   Vision abnormalities       PAST SURGICAL HISTORY: Past Surgical History:  Procedure Laterality Date   COLONOSCOPY     POLYPECTOMY     from nose   TOOTH EXTRACTION Bilateral 08/14/2020   Procedure: DENTAL RESTORATION/EXTRACTIONS;  Surgeon: Sheryle Hamilton, DMD;  Location: [facility] OR;  Service: Oral Surgery;  Laterality: Bilateral;    FAMILY HISTORY:  Family History  Problem Relation Age of Onset  Aneurysm Mother    Heart attack Father    Hypertension Father    Hyperlipidemia Father    Renal cancer Brother    Kidney disease Brother    Diabetes Neg Hx    Thyroid  disease Neg Hx     SOCIAL HISTORY:  Social History   Socioeconomic History   Marital status: Divorced    Spouse name: Not on file   Number of children: Not on file   Years of education: Not on file   Highest education level: Not on file  Occupational History   Not on file  Tobacco Use   Smoking status: Every Day    Current packs/day: 0.25    Types: Cigarettes    Smokeless tobacco: Never   Tobacco comments:    4 cigarettes a day  Substance and Sexual Activity   Alcohol use: Yes    Comment: occ   Drug use: Not Currently    Comment: none for 20 years   Sexual activity: Not on file  Other Topics Concern   Not on file  Social History Narrative   Not on file   Social Drivers of Health   Financial Resource Strain: Not on file  Food Insecurity: Food Insecurity Present (05/07/2018)   Hunger Vital Sign    Worried About Running Out of Food in the Last Year: Sometimes true    Ran Out of Food in the Last Year: Sometimes true  Transportation Needs: Not on file  Physical Activity: Not on file  Stress: Not on file  Social Connections: Not on file  Intimate Partner Violence: Not on file    ALLERGIES: Patient has no known allergies.  MEDICATIONS:  Current Outpatient Medications  Medication Sig Dispense Refill   aspirin EC 81 MG tablet Take 81 mg by mouth daily. Swallow whole.     Blood Pressure Monitoring (BLOOD PRESSURE MONITOR DELUXE) KIT 1 kit by Does not apply route 3 (three) times daily. 1 kit 0   carvedilol  (COREG ) 6.25 MG tablet Take 1 tablet (6.25 mg total) by mouth 2 (two) times daily. Please schedule appt for future refills. 2nd attempt 30 tablet 0   diltiazem (CARDIZEM CD) 240 MG 24 hr capsule Take 240 mg by mouth daily.     Dulaglutide (TRULICITY) 1.5 MG/0.5ML SOPN Trulicity 1.5 mg/0.5 mL subcutaneous pen injector  1 (one) Solution Pen-injector under skin weekly     DULoxetine  (CYMBALTA ) 20 MG capsule Take 2 capsules (40 mg total) by mouth daily. 60 capsule 3   fluticasone (FLONASE) 50 MCG/ACT nasal spray Place 2 sprays into both nostrils at bedtime.     hydrochlorothiazide  (HYDRODIURIL ) 25 MG tablet Take 1 tablet (25 mg total) by mouth daily. 30 tablet 5   hydrOXYzine  (ATARAX /VISTARIL ) 50 MG tablet Take 1 tablet (50 mg total) by mouth at bedtime. 30 tablet 3   ibuprofen  (ADVIL ) 800 MG tablet Take 1 tablet (800 mg total) by mouth every 8  (eight) hours as needed. 30 tablet 0   losartan  (COZAAR ) 100 MG tablet Take 1 tablet (100 mg total) by mouth daily. 30 tablet 3   lovastatin  (MEVACOR ) 20 MG tablet Take 1 tablet (20 mg total) by mouth at bedtime. 30 tablet 3   metoprolol  succinate (TOPROL -XL) 100 MG 24 hr tablet Take 100 mg by mouth daily. for high blood pressure     naloxone (NARCAN) nasal spray 4 mg/0.1 mL naloxone 4 mg/actuation nasal spray  1 (one) Spray as needed     nicotine  (NICODERM CQ ) 21 mg/24hr  patch Place 1 patch (21 mg total) onto the skin daily. (Patient not taking: Reported on 02/23/2022) 28 patch 0   oxyCODONE -acetaminophen  (PERCOCET) 5-325 MG tablet Take 1 every 6 hours as needed for pain not relieved by Tylenol  alone 20 tablet 0   phentermine (ADIPEX-P) 37.5 MG tablet Take 37.5 mg by mouth daily before breakfast.      polyethylene glycol (MIRALAX  / GLYCOLAX ) packet Take 17 g by mouth daily. 14 each 0   pravastatin (PRAVACHOL) 40 MG tablet Take 40 mg by mouth daily.     PROAIR HFA 108 (90 Base) MCG/ACT inhaler Inhale 1 puff into the lungs 4 (four) times daily as needed for shortness of breath.     Semaglutide, 1 MG/DOSE, (OZEMPIC, 1 MG/DOSE,) 4 MG/3ML SOPN Inject 1 mL into the skin once a week. (Patient not taking: Reported on 02/23/2022)     SYMBICORT 160-4.5 MCG/ACT inhaler Inhale 2 puffs into the lungs daily as needed (Shortnesss of breath).      tadalafil (CIALIS) 10 MG tablet tadalafil 10 mg tablet  1 (one) Tablet Tablet by mouth as needed     topiramate  (TOPAMAX ) 50 MG tablet Take 1 tablet (50 mg total) by mouth every evening. 10 tablet 2   traZODone (DESYREL) 100 MG tablet Take 100 mg by mouth at bedtime as needed for sleep.     Vitamin D, Ergocalciferol, (DRISDOL) 1.25 MG (50000 UT) CAPS capsule Take 1,250 capsules by mouth once a week.     No current facility-administered medications for this visit.    REVIEW OF SYSTEMS:  On review of systems, the patient reports that he is doing well overall. He denies  any chest pain, shortness of breath, cough, fevers, chills, night sweats, unintended weight changes. He denies any bowel disturbances, and denies abdominal pain, nausea or vomiting. He denies any new musculoskeletal or joint aches or pains. His IPSS was 5, indicating minimal urinary symptoms. His SHIM was 20, indicating he does not have erectile dysfunction. A complete review of systems is obtained and is otherwise negative.    PHYSICAL EXAM:  Wt Readings from Last 3 Encounters:  03/28/24 226 lb 6.4 oz (102.7 kg)  02/23/22 265 lb 1.6 oz (120.2 kg)  02/05/21 248 lb (112.5 kg)   Temp Readings from Last 3 Encounters:  02/11/24 98.3 F (36.8 C) (Oral)  02/23/22 98.3 F (36.8 C) (Oral)  02/05/21 (!) 97.4 F (36.3 C) (Oral)   BP Readings from Last 3 Encounters:  02/11/24 (!) 171/110  02/23/22 (!) 150/68  02/05/21 (!) 141/83   Pulse Readings from Last 3 Encounters:  02/11/24 (!) 115  02/23/22 92  02/05/21 89    /10  In general this is a well appearing African American male in no acute distress. He's alert and oriented x4 and appropriate throughout the examination. Cardiopulmonary assessment is negative for acute distress, and he exhibits normal effort.     KPS = 100  100 - Normal; no complaints; no evidence of disease. 90   - Able to carry on normal activity; minor signs or symptoms of disease. 80   - Normal activity with effort; some signs or symptoms of disease. 42   - Cares for self; unable to carry on normal activity or to do active work. 60   - Requires occasional assistance, but is able to care for most of his personal needs. 50   - Requires considerable assistance and frequent medical care. 40   - Disabled; requires special care and assistance. 30   -  Severely disabled; hospital admission is indicated although death not imminent. 20   - Very sick; hospital admission necessary; active supportive treatment necessary. 10   - Moribund; fatal processes progressing rapidly. 0      - Dead  Karnofsky DA, Abelmann WH, Craver LS and Burchenal [facility] [phone]) The use of the nitrogen mustards in the palliative treatment of carcinoma: with particular reference to bronchogenic carcinoma Cancer 1 634-56  LABORATORY DATA:  Lab Results  Component Value Date   WBC 10.3 02/11/2024   HGB 14.8 02/11/2024   HCT 46.0 02/11/2024   MCV 94.8 02/11/2024   PLT 249 02/11/2024   Lab Results  Component Value Date   NA 136 02/11/2024   K 3.7 02/11/2024   CL 103 02/11/2024   CO2 24 02/11/2024   Lab Results  Component Value Date   ALT 14 02/11/2024   AST 16 02/11/2024   ALKPHOS 75 02/11/2024   BILITOT 0.9 02/11/2024     RADIOGRAPHY: No results found.    IMPRESSION/PLAN: 1. 68 y.o. 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  We discussed the patient's workup and outlined the nature of prostate cancer in this setting. The patient's T stage, Gleason's score, and PSA put him into the high risk group. Accordingly, he is eligible for a variety of potential treatment options including prostatectomy or LT-ADT concurrent with either 8 weeks of external radiation or 5 weeks of external radiation with an upfront brachytherapy boost. We discussed the available radiation techniques, and focused on the details and logistics of delivery. We discussed and outlined the risks, benefits, short and long-term effects associated with radiotherapy and compared and contrasted these with prostatectomy. We discussed the role of SpaceOAR gel in reducing the rectal toxicity associated with radiotherapy. We also detailed the role of ADT in the treatment of high risk prostate cancer and outlined the associated side effects that could be expected with this therapy. He appears to have a good understanding of his disease and our treatment recommendations which are of curative intent.  He was encouraged to ask questions that were answered to his  stated satisfaction.  At the conclusion of our conversation, the patient is undecided but appears to be leaning towards proceeding with LT-ADT concurrent with brachytherapy boost and SpaceOAR gel placement followed by 5 weeks of daily external beam radiation to the prostate and pelvic lymph nodes. He plans to reach a decision in the next week and has our contact information to let us  know if he wants to proceed with the combination seed boost and external radiation. He has filled prescriptions of Orgovyx and Nubequa but has not started these as of yet because he had some questions and concerns about the medications that had not been addressed. We discussed this in detail today and he will begin taking these medications if he ultimately elects to proceed with the radiation. We will await his decision and look forward to continuing to participate in his care.   We personally spent 70 minutes in this encounter including chart review, reviewing radiological studies, meeting face-to-face with the patient, entering orders and completing documentation.    Sabra MICAEL Rusk, PA-C    Donnice Barge, MD  [facility]  Radiation Oncology Direct Dial: [phone]  Fax: [phone] [email or web address]  Skype  LinkedIn   This document serves as a record of services personally performed by Donnice Barge, MD and Sabra Rusk, PA-C. It was created on their behalf  by Izetta Neither, a trained medical scribe. The creation of this record is based on the scribe's personal observations and the provider's statements to them. This document has been checked and approved by the attending provider.

## 2024-07-09 NOTE — Progress Notes (Signed)
 GU Location of Tumor / Histology: Prostate Ca  If Prostate Cancer, Gleason Score is (4 + 4) and PSA is (15.30 on 01/31/2024)  Thomas Medina presented as referral from Dr. Sherwood BIRCH. [name] [facility]) elevated PSA.  Biopsies       05/15/2024 Dr. Sherwood D. Bell III NM PET (PSMA) Skull to Mid Thigh CLINICAL DATA:  High-risk prostate carcinoma. Staging.   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 No other sites of metastatic disease identified.   Past/Anticipated interventions by urology, if any:  Dr. Sherwood BIRCH. Bell III   Past/Anticipated interventions by medical oncology, if any: NA  Weight changes, if any: {:18581}  IPSS: SHIM:  Bowel/Bladder complaints, if any: {:18581}   Nausea/Vomiting, if any: {:18581}  Pain issues, if any:  {:18581}  SAFETY ISSUES: Prior radiation? {:18581} Pacemaker/ICD? {:18581} Possible current pregnancy? Male Is the patient on methotrexate? No  Current Complaints / other details:     30 minutes spent total, including time for meaningful use questions, reviewing medication, as well as spent in face-to-face time in nurse evaluation with the patient.

## 2024-07-12 ENCOUNTER — Encounter: Payer: Self-pay | Admitting: Radiation Oncology

## 2024-07-12 ENCOUNTER — Ambulatory Visit
Admission: RE | Admit: 2024-07-12 | Discharge: 2024-07-12 | Disposition: A | Discharge status: Home / Self Care | Source: Ambulatory Visit | Attending: Radiation Oncology | Admitting: Radiation Oncology

## 2024-07-12 VITALS — BP 156/74 | HR 71 | Temp 97.7°F | Resp 18 | Ht 69.0 in | Wt 218.8 lb

## 2024-07-12 DIAGNOSIS — C61 Malignant neoplasm of prostate: Secondary | ICD-10-CM

## 2024-07-12 DIAGNOSIS — Z7982 Long term (current) use of aspirin: Secondary | ICD-10-CM | POA: Diagnosis not present

## 2024-07-12 DIAGNOSIS — J449 Chronic obstructive pulmonary disease, unspecified: Secondary | ICD-10-CM | POA: Diagnosis not present

## 2024-07-12 DIAGNOSIS — E785 Hyperlipidemia, unspecified: Secondary | ICD-10-CM | POA: Diagnosis not present

## 2024-07-12 DIAGNOSIS — G4730 Sleep apnea, unspecified: Secondary | ICD-10-CM | POA: Insufficient documentation

## 2024-07-12 DIAGNOSIS — I10 Essential (primary) hypertension: Secondary | ICD-10-CM | POA: Diagnosis not present

## 2024-07-12 DIAGNOSIS — Z8051 Family history of malignant neoplasm of kidney: Secondary | ICD-10-CM | POA: Insufficient documentation

## 2024-07-12 DIAGNOSIS — Z79899 Other long term (current) drug therapy: Secondary | ICD-10-CM | POA: Insufficient documentation

## 2024-07-12 DIAGNOSIS — Z7985 Long-term (current) use of injectable non-insulin antidiabetic drugs: Secondary | ICD-10-CM | POA: Diagnosis not present

## 2024-07-12 DIAGNOSIS — E119 Type 2 diabetes mellitus without complications: Secondary | ICD-10-CM | POA: Insufficient documentation

## 2024-07-12 DIAGNOSIS — Z7951 Long term (current) use of inhaled steroids: Secondary | ICD-10-CM | POA: Insufficient documentation

## 2024-07-12 DIAGNOSIS — C775 Secondary and unspecified malignant neoplasm of intrapelvic lymph nodes: Secondary | ICD-10-CM | POA: Insufficient documentation

## 2024-07-12 DIAGNOSIS — F17210 Nicotine dependence, cigarettes, uncomplicated: Secondary | ICD-10-CM | POA: Diagnosis not present

## 2024-07-12 HISTORY — DX: Elevated prostate specific antigen (PSA): R97.20

## 2024-07-15 ENCOUNTER — Encounter: Payer: Self-pay | Admitting: Radiology

## 2024-07-15 ENCOUNTER — Telehealth: Payer: Self-pay | Admitting: *Deleted

## 2024-07-15 NOTE — Progress Notes (Signed)
 RN reached out to patient to introduce myself and role.  No answer, voicemail box is full.  RN will reach out again at a later time.

## 2024-07-15 NOTE — Telephone Encounter (Signed)
XXXXX

## 2024-07-15 NOTE — Telephone Encounter (Signed)
 CALLED PATIENT TO ASK QUESTIONS, VM FULL UNABLE TO LEAVE MESSAGE

## 2024-07-18 NOTE — Progress Notes (Signed)
 RN left message for call back to review treatment decision/questions after recent consult.

## 2024-07-23 ENCOUNTER — Telehealth: Payer: Self-pay | Admitting: *Deleted

## 2024-07-23 NOTE — Telephone Encounter (Signed)
CALLED PATIENT TO ASK QUESTIONS, SPOKE WITH PATIENT 

## 2024-07-26 NOTE — Progress Notes (Signed)
 RN spoke with patient to follow up regarding treatment decision.  Patient remains undecided, and has not yet started Orgovyx and Nubeqa.   RN provided education on medications.  RN also will reach out to NN at [facility], Clayborne, to proceed with additional education so patient feels comfortable proceeding with medications.  Will continue to follow.

## 2024-07-29 NOTE — Progress Notes (Signed)
 RN spoke with NN, Clayborne, at [facility].  Cl[place] will reach out to patient to review additional education on medications.  This RN will follow up with patient to confirm start date of medications.

## 2024-08-06 NOTE — Progress Notes (Signed)
 RN spoke with Clayborne at [facility] to follow up with medication education.  Cl[place]borne has reached out to patient several times, however, patient will not return call.   RN reached out to patient to follow up providing direct contact at [facility].  No answer, voicemail box full.  Will reach out again.

## 2024-08-07 ENCOUNTER — Telehealth: Payer: Self-pay | Admitting: *Deleted

## 2024-08-07 ENCOUNTER — Other Ambulatory Visit: Payer: Self-pay | Admitting: Urology

## 2024-08-07 NOTE — Telephone Encounter (Signed)
 CALLED PATIENT TO INFORM OF PRE-SEED APPTS. AND IMPLANT DATE, SPOKE WITH PATIENT AND HE IS AWARE OF THESE APPTS.

## 2024-08-16 NOTE — Progress Notes (Signed)
 RN spoke with patient in detail about his treatment recommendations of LT-ADT, brachy boost, and 5 weeks of IMRT.  Patient has not started his ADT or targeted therapy, Nubeqa.   RN provided extensive education, patient declined additional follow up with urology at this time.  RN will coordinate a follow up visit with our office for additional counseling to ensure he is well informed of his treatment recommendations.

## 2024-08-19 ENCOUNTER — Telehealth: Payer: Self-pay | Admitting: *Deleted

## 2024-08-19 NOTE — Telephone Encounter (Signed)
 Called patient to ask about seeing Thomas Medina or talking to her on the phone on 08-22-24, patient declined due to having another doctor's appt., informed Vertell Pont,

## 2024-08-21 ENCOUNTER — Telehealth: Payer: Self-pay | Admitting: *Deleted

## 2024-08-21 NOTE — Telephone Encounter (Signed)
 CALLED PATIENT TO ASK QUESTION, LVM FOR A RETURN CALL

## 2024-08-22 ENCOUNTER — Telehealth: Payer: Self-pay | Admitting: *Deleted

## 2024-08-22 NOTE — Telephone Encounter (Signed)
 Called patient to ask about coming to see Ashlyn Bruning for fu appt., patient agreed to come on 08-27-24 @ 1 pm, appt. has been booked.

## 2024-08-22 NOTE — Progress Notes (Incomplete)
 GU Location of Tumor / Histology:   68 y.o. 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   If Prostate Cancer, Gleason Score is (4 + 4) and PSA is (15.3)  Thomas Medina presented 7 months ago with signs/symptoms of:  increased PSA and digital rectal examination performed at that time showed firmness and nodularity on the right.   Biopsies of the prostate (if applicable) revealed:  Out of 16 core biopsies, 14 were positive. The maximum Gleason score was 4+4, and this was seen in the right apex, right base lateral (with perineural invasion), and right mid lateral. Additionally, Gleason 4+3 was seen in all four cores from the MRI ROI (with PNI) as well as in the left mid lateral, right base (with PNI), right mid, and right apex lateral (with PNI), and Gleason 3+4 in the left mid and left apex lateral.   Past/Anticipated interventions by urology, if any:  04/05/24 biopsy of prostate  Past/Anticipated interventions by medical oncology, if any:  N/A  Weight changes, if any: {:18581}  Bowel/Bladder complaints, if any: {:18581}   Nausea/Vomiting, if any: {:18581}  Pain issues, if any:  {:18581}  SAFETY ISSUES: Prior radiation? no Pacemaker/ICD? no Possible current pregnancy? No, male Is the patient on methotrexate? no  Current Complaints / other details:  ***

## 2024-08-27 ENCOUNTER — Ambulatory Visit: Admitting: Urology

## 2024-08-27 ENCOUNTER — Encounter: Payer: Self-pay | Admitting: Urology

## 2024-08-27 NOTE — Progress Notes (Signed)
 Orgovyx (relugolix) is associated with several risks, including cardiovascular events, nephrotoxicity, and gastrointestinal issues. It should be used with caution in patients with conditions that may increase the risk of QT prolongation.  Cardiovascular risks: myocardial infarction (0.8%), stroke, AV block, heart failure.[1-2]  Nephrotoxicity: acute kidney injury (0.6%), including fatal cases (0.2%).[1-2]  Gastrointestinal issues: diarrhea (12%), constipation (12%), colitis, abdominal pain.[1-2]  General adverse reactions: fatigue/asthenia (26%), hot flashes (54%), musculoskeletal pain (30%).[1-2]  Other risks: elevated hepatic enzymes, hyperglycemia, hypertriglyceridemia, anemia, and potential for laboratory test interference.[1-2]

## 2024-08-29 NOTE — Progress Notes (Signed)
 RN spoke with patient to follow up regarding treatment recommendations, and starting medications Orgovyx and Nubeqa.   Patient had a visit scheduled Tuesday 12/16, but unfortunately forgot about this being scheduled.   RN offered to review additional education on medications, but patient prefers to be seen again prior to start of therapy.    RN will coordinate for an additional follow up and notify patient.

## 2024-09-09 NOTE — Progress Notes (Incomplete)
 Pre-seed nursing interview for a diagnosis of  68 y.o. 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   Patient identity verified x2.   Patient states issues as follows...  -Pain: *** -Fatigue: *** -Abdomen: *** -Groin: *** -Urinary: *** -Bowels: *** -Appetite: *** -Weight:  Patient denies all other related issues at this time.  Meaningful use complete.  Urinary Management medication(s)-  None Urology appointment date- ***, with Dr. Sherwood Edison at ***  No vitals needed for this visit.  This concludes the interaction.  NURSE REMINDER: START 'PRE-SEED' EDUCATION VIDEO AT 4:25 mins.

## 2024-09-17 ENCOUNTER — Telehealth: Payer: Self-pay | Admitting: *Deleted

## 2024-09-17 NOTE — Telephone Encounter (Signed)
 Called patient to remind of pre-seed appts. for 09-19-24, lvm for a return call

## 2024-09-19 ENCOUNTER — Ambulatory Visit
Admission: RE | Admit: 2024-09-19 | Discharge: 2024-09-19 | Disposition: A | Discharge status: Home / Self Care | Source: Ambulatory Visit | Attending: Urology | Admitting: Urology

## 2024-09-19 ENCOUNTER — Ambulatory Visit: Admitting: Urology

## 2024-09-19 ENCOUNTER — Ambulatory Visit: Attending: Radiation Oncology | Admitting: Radiation Oncology

## 2024-09-19 NOTE — Progress Notes (Incomplete)
 Pre-seed nursing interview for a diagnosis of 69 y.o. 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   Patient identity verified x2.   Patient states issues as follows...  -Pain: No -Fatigue: No -Abdomen: No -Groin: No -Urinary: No -Bowels: No -Appetite: Normal -Weight:  Patient denies all other related issues at this time.  Meaningful use complete.  Urinary Management medication(s)- none Urology appointment date- ***, with Dr. Sherwood Edison at ***  No vitals needed for this visit.  This concludes the interaction.  NURSE REMINDER: START 'PRE-SEED' EDUCATION VIDEO AT 4:25 mins.

## 2024-09-19 NOTE — Progress Notes (Signed)
 Patient is now scheduled for CT Simulation and follow up with Ashlyn to review treatment recommendations on Monday 09/23/2024.  RN spoke with patient and provided this information and reasoning for appointments.    Patient remains off of recommended therapy, Orgovyx and Nubeqa at this time.  Extensive education has been given to patient on risk vs benefit of medications, however, he does wish to speak further about these with provider.  This will be discussed at upcoming appointment on 09/23/2024.

## 2024-09-22 ENCOUNTER — Emergency Department (HOSPITAL_COMMUNITY)

## 2024-09-22 ENCOUNTER — Emergency Department (HOSPITAL_COMMUNITY)
Admission: EM | Admit: 2024-09-22 | Discharge: 2024-09-22 | Disposition: A | Discharge status: Home / Self Care | Attending: Emergency Medicine | Admitting: Emergency Medicine

## 2024-09-22 DIAGNOSIS — Y92481 Parking lot as the place of occurrence of the external cause: Secondary | ICD-10-CM | POA: Diagnosis not present

## 2024-09-22 DIAGNOSIS — Z7982 Long term (current) use of aspirin: Secondary | ICD-10-CM | POA: Insufficient documentation

## 2024-09-22 DIAGNOSIS — Z8546 Personal history of malignant neoplasm of prostate: Secondary | ICD-10-CM | POA: Insufficient documentation

## 2024-09-22 DIAGNOSIS — W1830XA Fall on same level, unspecified, initial encounter: Secondary | ICD-10-CM | POA: Diagnosis not present

## 2024-09-22 DIAGNOSIS — Z79899 Other long term (current) drug therapy: Secondary | ICD-10-CM | POA: Diagnosis not present

## 2024-09-22 DIAGNOSIS — M25532 Pain in left wrist: Secondary | ICD-10-CM | POA: Insufficient documentation

## 2024-09-22 DIAGNOSIS — M25432 Effusion, left wrist: Secondary | ICD-10-CM

## 2024-09-22 DIAGNOSIS — M7989 Other specified soft tissue disorders: Secondary | ICD-10-CM | POA: Insufficient documentation

## 2024-09-22 DIAGNOSIS — R0781 Pleurodynia: Secondary | ICD-10-CM | POA: Insufficient documentation

## 2024-09-22 DIAGNOSIS — W19XXXA Unspecified fall, initial encounter: Secondary | ICD-10-CM

## 2024-09-22 DIAGNOSIS — I10 Essential (primary) hypertension: Secondary | ICD-10-CM | POA: Insufficient documentation

## 2024-09-22 MED ORDER — IBUPROFEN 800 MG PO TABS
800.0000 mg | ORAL_TABLET | Freq: Once | ORAL | Status: AC
  Administered 2024-09-22: 800 mg via ORAL
  Filled 2024-09-22: qty 1

## 2024-09-22 MED ORDER — IBUPROFEN 800 MG PO TABS: 800.0000 mg | ORAL_TABLET | Freq: Three times a day (TID) | ORAL | 0 refills | Status: AC

## 2024-09-22 MED ORDER — ACETAMINOPHEN 500 MG PO TABS
1000.0000 mg | ORAL_TABLET | Freq: Once | ORAL | Status: AC
  Administered 2024-09-22: 1000 mg via ORAL
  Filled 2024-09-22: qty 2

## 2024-09-22 NOTE — ED Triage Notes (Addendum)
 Patient reports mechanical fall yesterday in aprking lot hitting face, has small cut and slight swelling to upper lip no active bleeding, denies LOC/thinners/midline tenderness. PERRLA. Patient is alert and oriented x 4. Airway patent, respirations even and unlabored. Skin normal, warm and dry. Patient c/o left rib and left wrist. No deformities noted. Patient reports he did not take his BP meds today, denies blurred vision/headache.

## 2024-09-22 NOTE — Discharge Instructions (Signed)
 It was a pleasure taking care of you today. You were seen in the Emergency Department for evaluation after a fall. Your work-up was reassuring. Your Xrays showed no evidence of fracture or dislocation.  There is some evidence of soft tissue swelling, which is to be expected after a fall.  As we discussed, you can continue taking ibuprofen  as needed for the next 7 days.  I have sent a high-dose prescription to your pharmacy.  You may also alternate with Tylenol  up to 1000 mg, every 6 hours.  I have placed you in an Ace bandage for your comfort.  Do your best to keep your hand above your heart to help minimize swelling.  You may also continue to ice the area of discomfort for up to 20 minutes at a time. Refer to the attached documentation for further management of your symptoms. Follow up with her PCP if your symptoms continue.  Please return to the ER if you experience chest pain, trouble breathing, intractable nausea/vomiting or any other life threatening illnesses.

## 2024-09-22 NOTE — ED Provider Notes (Signed)
 " [facility]MENT AT [facility] Provider Note   CSN: [id number] Arrival date & time: 09/22/24  1046     Patient presents with: Thomas Medina is a 69 y.o. male with past medical history of hypertension, hyperlipidemia, prostate cancer, who presents emergency department for evaluation of a mechanical fall yesterday.  Patient reports that he was in the parking lot when he lost his footing and tried to brace his fall with his left wrist.  He is currently endorsing left rib pain as well as left wrist pain.  He reports taking 2 baby aspirin last night.  He denies any his head.  Patient denies LOC.  Patient states he did hit his lip when he fell and notes a small laceration on his upper lip which is hemostatic.  He denies any blood thinners.    Fall       Prior to Admission medications  Medication Sig Start Date End Date Taking? Authorizing Provider  ibuprofen  (ADVIL ) 800 MG tablet Take 1 tablet (800 mg total) by mouth 3 (three) times daily. 09/22/24  Yes Arieon Corcoran, Marry RAMAN, PA-C  ACCU-CHEK GUIDE TEST test strip 1 each daily. 03/20/24   [provider]  aspirin EC 81 MG tablet Take 81 mg by mouth daily. Swallow whole.    [provider]  BELBUCA 600 MCG FILM 1 (one) Film inside cheeks every twelve hours    [provider]  Blood Pressure Monitoring (BLOOD PRESSURE MONITOR DELUXE) KIT 1 kit by Does not apply route 3 (three) times daily. 08/20/19   Celestia Rosaline SQUIBB, NP  carvedilol  (COREG ) 6.25 MG tablet Take 1 tablet (6.25 mg total) by mouth 2 (two) times daily. Please schedule appt for future refills. 2nd attempt 01/04/21   Lonni Slain, MD  diclofenac (VOLTAREN) 75 MG EC tablet Take 75 mg by mouth daily.    [provider]  diltiazem (CARDIZEM CD) 240 MG 24 hr capsule Take 240 mg by mouth daily. 07/09/20   [provider]  Dulaglutide (TRULICITY) 1.5 MG/0.5ML SOPN Trulicity 1.5 mg/0.5 mL subcutaneous  pen injector  1 (one) Solution Pen-injector under skin weekly    [provider]  DULoxetine  (CYMBALTA ) 20 MG capsule Take 2 capsules (40 mg total) by mouth daily. 05/14/19   Celestia Rosaline SQUIBB, NP  hydrochlorothiazide  (HYDRODIURIL ) 25 MG tablet Take 1 tablet (25 mg total) by mouth daily. 08/20/19   Celestia Rosaline SQUIBB, NP  losartan  (COZAAR ) 100 MG tablet Take 1 tablet (100 mg total) by mouth daily. 05/14/19   Celestia Rosaline SQUIBB, NP  nicotine  (NICODERM CQ ) 21 mg/24hr patch Place 1 patch (21 mg total) onto the skin daily. Patient not taking: Reported on 02/23/2022 08/14/20   Sheryle Hamilton, DMD  Oxycodone  HCl 10 MG TABS Take 10 mg by mouth as needed.    [provider]  predniSONE  (DELTASONE ) 5 MG tablet Take 5 mg by mouth daily with breakfast.    [provider]  tadalafil (CIALIS) 10 MG tablet tadalafil 10 mg tablet  1 (one) Tablet Tablet by mouth as needed    [provider]  traZODone (DESYREL) 100 MG tablet Take 100 mg by mouth at bedtime as needed for sleep.    [provider]  triamterene-hydrochlorothiazide  (MAXZIDE) 75-50 MG tablet Take 1 tablet by mouth daily.    [provider]  triamterene-hydrochlorothiazide  (MAXZIDE-25) 37.5-25 MG tablet Take 1 tablet by mouth daily.    [provider]  Vitamin D, Ergocalciferol, (DRISDOL)  1.25 MG (50000 UT) CAPS capsule Take 1,250 capsules by mouth once a week. 04/16/19   [provider]    Allergies: Patient has no known allergies.    Review of Systems  Musculoskeletal:  Positive for joint swelling.    Updated Vital Signs BP (!) 194/101 (BP Location: Right Arm)   Pulse 93   Temp 98.1 F (36.7 C) (Oral)   Resp 20   SpO2 94%   Physical Exam Vitals and nursing note reviewed.  Constitutional:      Appearance: Normal appearance. He is not ill-appearing.  Eyes:     General: No scleral icterus. Pulmonary:     Effort: Pulmonary effort is normal. No respiratory distress.   Musculoskeletal:        General: Swelling and deformity present.     Comments: Notable left hand and wrist swelling.  Radial pulses intact.  Patient able to make a partial fist, but limited due to pain.  Patient able to bend and flex his elbow without difficulty.  He does have tenderness to palpation on the left side of his ribs.  Skin:    Coloration: Skin is not jaundiced.  Neurological:     General: No focal deficit present.     Mental Status: He is alert.  Psychiatric:        Mood and Affect: Mood normal.     (all labs ordered are listed, but only abnormal results are displayed) Labs Reviewed - No data to display  EKG: None  Radiology: DG Chest Portable 1 View Result Date: 09/22/2024 CLINICAL DATA:  Status post fall. EXAM: PORTABLE CHEST 1 VIEW COMPARISON:  September 22, 2024 FINDINGS: The heart size and mediastinal contours are within normal limits. There is marked severity calcification of the aortic arch. Mild atelectasis and/or infiltrate is seen within the bilateral lung bases. No pleural effusion or pneumothorax is identified. No acute osseous abnormalities are identified. IMPRESSION: Mild bibasilar atelectasis and/or infiltrate. Electronically Signed   By: Suzen Dials M.D.   On: 09/22/2024 13:20   DG Hand Complete Left Result Date: 09/22/2024 CLINICAL DATA:  Status post fall. EXAM: LEFT HAND - COMPLETE 3+ VIEW COMPARISON:  None Available. FINDINGS: The fifth left finger is partially flexed at the PIP joint and subsequently limited in evaluation. There is no evidence of an acute fracture or dislocation. Small chronic appearing cortical opacities are seen adjacent to the base of the fifth left metacarpal. Mild degenerative changes are seen along the first carpometacarpal joint. Soft tissues are unremarkable. IMPRESSION: 1. No acute fracture or dislocation. 2. Mild degenerative changes along the first carpometacarpal joint. Electronically Signed   By: Suzen Dials M.D.   On:  09/22/2024 13:19   DG Wrist Complete Left Result Date: 09/22/2024 CLINICAL DATA:  Status post fall. EXAM: LEFT WRIST - COMPLETE 3+ VIEW COMPARISON:  None Available. FINDINGS: There is no evidence of acute fracture or dislocation. Small chronic appearing cortical opacities are seen adjacent to the base of the fifth left metacarpal. Mild degenerative changes are seen involving the first carpometacarpal joint. Soft tissues are unremarkable. IMPRESSION: 1. No acute fracture or dislocation. 2. Mild degenerative changes involving the first carpometacarpal joint. Electronically Signed   By: Suzen Dials M.D.   On: 09/22/2024 13:12     Procedures   Medications Ordered in the ED  acetaminophen  (TYLENOL ) tablet 1,000 mg (1,000 mg Oral Given 09/22/24 1220)  ibuprofen  (ADVIL ) tablet 800 mg (800 mg Oral Given 09/22/24 1220)  Medical Decision Making Amount and/or Complexity of Data Reviewed Radiology: ordered.  Risk OTC drugs. Prescription drug management.   This patient presents to the ED for concern of left wrist and rib pain after a fall, this involves an extensive number of treatment options, and is a complaint that carries with it a high risk of complications and morbidity.   Differential diagnosis includes: Wrist fracture, dislocation, soft tissue swelling, rib fracture, pneumothorax, hemothorax  Co morbidities:  hypertension   Lab Tests:  Not indicated  Imaging Studies:  I ordered imaging studies including chest x-ray, left wrist x-ray, left hand x-ray I independently visualized and interpreted imaging which showed no acute abnormality, evidence of fracture or dislocation I agree with the radiologist interpretation  Cardiac Monitoring/ECG:  The patient was maintained on a cardiac monitor.  I personally viewed and interpreted the cardiac monitored which showed an underlying rhythm of: Sinus rhythm  Medicines ordered and prescription drug  management:  I ordered medication including  Medications  acetaminophen  (TYLENOL ) tablet 1,000 mg (1,000 mg Oral Given 09/22/24 1220)  ibuprofen  (ADVIL ) tablet 800 mg (800 mg Oral Given 09/22/24 1220)   for pain management Reevaluation of the patient after these medicines showed that the patient improved I have reviewed the patients home medicines and have made adjustments as needed  Test Considered:   none  Critical Interventions:   none  Consultations Obtained: None  Problem List / ED Course:     ICD-10-CM   1. Fall, initial encounter  W19.XXXA     2. Pain and swelling of left wrist  M25.532    M25.432       MDM: 69 year old male who presents the emergency department for evaluation after mechanical fall yesterday.  He complains of left wrist/hand and left rib pain.  I did obtain x-rays to rule out fracture or dislocation.  X-rays are negative.  However, patient does have some soft tissue swelling evident in the left hand so I ordered ibuprofen  and Tylenol  as well as ice.  I have placed patient in a wrist splint for his comfort.  I recommended he continue taking high-dose ibuprofen  as needed for the next 7 days for pain and swelling.  Patient verbalizes understanding to this.  Recommendation for patient to follow-up with his PCP if his symptoms do not improve.  His vital signs are stable.  Patient is appropriate for discharge at this time.   Dispostion:  After consideration of the diagnostic results and the patients response to treatment, I feel that the patient would benefit from supportive care.    Final diagnoses:  Fall, initial encounter  Pain and swelling of left wrist    ED Discharge Orders          Ordered    ibuprofen  (ADVIL ) 800 MG tablet  3 times daily        09/22/24 1327               Torrence Marry RAMAN, [place] 09/22/24 1334  "

## 2024-09-23 ENCOUNTER — Telehealth: Payer: Self-pay | Admitting: Radiation Oncology

## 2024-09-23 ENCOUNTER — Ambulatory Visit: Admitting: Radiation Oncology

## 2024-09-23 ENCOUNTER — Ambulatory Visit: Admitting: Urology

## 2024-09-23 NOTE — Telephone Encounter (Signed)
 1/12 Received voicemail for patient's representative to cancel appts for today.  Email forward to Ashlyn and copied Nursing/CT sim.

## 2024-09-25 ENCOUNTER — Encounter (HOSPITAL_COMMUNITY): Admission: RE | Admit: 2024-09-25 | Source: Ambulatory Visit

## 2024-09-30 ENCOUNTER — Ambulatory Visit (HOSPITAL_COMMUNITY): Admit: 2024-09-30 | Admitting: Urology

## 2024-09-30 ENCOUNTER — Encounter (HOSPITAL_COMMUNITY): Admission: RE | Payer: Self-pay | Source: Home / Self Care

## 2024-09-30 SURGERY — INSERTION, RADIATION SOURCE, PROSTATE
Anesthesia: General

## 2024-09-30 NOTE — Progress Notes (Signed)
 RN reached out to nurse navigator at [facility] to discuss getting patient back in with Dr. Carolee.    Will follow to ensure appropriate urology follow up's.

## 2024-10-03 NOTE — Progress Notes (Signed)
 RN spoke with Clayborne at [facility].  Clayborne will work with patient and Dr. Carolee to get patient back in for ongoing urology follow up's.
